# Patient Record
Sex: Male | Born: 1945 | Race: White | Hispanic: No | Marital: Married | Smoking: Former smoker
Health system: Southern US, Community
[De-identification: ages and names within clinical notes are randomized; demographics above are authoritative.]

## PROBLEM LIST (undated history)

## (undated) DIAGNOSIS — H409 Unspecified glaucoma: Secondary | ICD-10-CM

## (undated) DIAGNOSIS — C4491 Basal cell carcinoma of skin, unspecified: Secondary | ICD-10-CM

## (undated) DIAGNOSIS — I509 Heart failure, unspecified: Secondary | ICD-10-CM

## (undated) DIAGNOSIS — K621 Rectal polyp: Secondary | ICD-10-CM

## (undated) DIAGNOSIS — D125 Benign neoplasm of sigmoid colon: Secondary | ICD-10-CM

## (undated) DIAGNOSIS — D122 Benign neoplasm of ascending colon: Secondary | ICD-10-CM

## (undated) DIAGNOSIS — Z972 Presence of dental prosthetic device (complete) (partial): Secondary | ICD-10-CM

## (undated) DIAGNOSIS — I2510 Atherosclerotic heart disease of native coronary artery without angina pectoris: Secondary | ICD-10-CM

## (undated) DIAGNOSIS — I700 Atherosclerosis of aorta: Secondary | ICD-10-CM

## (undated) DIAGNOSIS — I719 Aortic aneurysm of unspecified site, without rupture: Secondary | ICD-10-CM

## (undated) DIAGNOSIS — K5792 Diverticulitis of intestine, part unspecified, without perforation or abscess without bleeding: Secondary | ICD-10-CM

## (undated) DIAGNOSIS — E7800 Pure hypercholesterolemia, unspecified: Secondary | ICD-10-CM

## (undated) DIAGNOSIS — K635 Polyp of colon: Secondary | ICD-10-CM

## (undated) DIAGNOSIS — I714 Abdominal aortic aneurysm, without rupture: Secondary | ICD-10-CM

## (undated) DIAGNOSIS — I82409 Acute embolism and thrombosis of unspecified deep veins of unspecified lower extremity: Secondary | ICD-10-CM

## (undated) DIAGNOSIS — D124 Benign neoplasm of descending colon: Secondary | ICD-10-CM

## (undated) HISTORY — DX: Polyp of colon: K63.5

## (undated) HISTORY — DX: Unspecified glaucoma: H40.9

## (undated) HISTORY — DX: Abdominal aortic aneurysm, without rupture: I71.4

## (undated) HISTORY — DX: Basal cell carcinoma of skin, unspecified: C44.91

## (undated) HISTORY — DX: Benign neoplasm of descending colon: D12.4

## (undated) HISTORY — PX: COLONOSCOPY: SHX174

## (undated) HISTORY — DX: Heart failure, unspecified: I50.9

## (undated) HISTORY — DX: Rectal polyp: K62.1

## (undated) HISTORY — PX: OTHER SURGICAL HISTORY: SHX169

## (undated) HISTORY — DX: Atherosclerosis of aorta: I70.0

## (undated) HISTORY — PX: CATARACT EXTRACTION: SUR2

## (undated) HISTORY — DX: Benign neoplasm of ascending colon: D12.2

## (undated) HISTORY — DX: Acute embolism and thrombosis of unspecified deep veins of unspecified lower extremity: I82.409

## (undated) HISTORY — DX: Benign neoplasm of sigmoid colon: D12.5

## (undated) HISTORY — DX: Aortic aneurysm of unspecified site, without rupture: I71.9

---

## 2005-02-04 HISTORY — PX: MOHS SURGERY: SHX181

## 2012-07-19 ENCOUNTER — Ambulatory Visit: Payer: Self-pay | Admitting: Gastroenterology

## 2013-07-29 DIAGNOSIS — Z87898 Personal history of other specified conditions: Secondary | ICD-10-CM | POA: Insufficient documentation

## 2013-07-29 HISTORY — DX: Personal history of other specified conditions: Z87.898

## 2013-08-02 ENCOUNTER — Ambulatory Visit: Payer: Self-pay | Admitting: Internal Medicine

## 2013-08-02 LAB — LIPID PANEL
CHOLESTEROL: 131 mg/dL (ref 0–200)
HDL: 37 mg/dL (ref 35–70)
LDL CALC: 68 mg/dL
Triglycerides: 131 mg/dL (ref 40–160)

## 2013-08-02 LAB — CBC AND DIFFERENTIAL: HEMOGLOBIN: 17.8 g/dL — AB (ref 13.5–17.5)

## 2013-08-02 LAB — BASIC METABOLIC PANEL
BUN: 12 mg/dL (ref 4–21)
CREATININE: 1 mg/dL (ref <=1.3)

## 2013-08-02 LAB — PSA: PSA: 2

## 2013-08-02 LAB — TSH: TSH: 2.2 u[IU]/mL (ref <=5.90)

## 2013-08-10 ENCOUNTER — Observation Stay: Payer: Self-pay | Admitting: Surgery

## 2013-08-10 ENCOUNTER — Ambulatory Visit: Payer: Self-pay | Admitting: Internal Medicine

## 2013-08-10 LAB — CBC WITH DIFFERENTIAL/PLATELET
BASOS ABS: 0.1 10*3/uL (ref 0.0–0.1)
BASOS PCT: 0.7 %
EOS PCT: 1.5 %
Eosinophil #: 0.2 10*3/uL (ref 0.0–0.7)
HCT: 48.9 % (ref 40.0–52.0)
HGB: 15.8 g/dL (ref 13.0–18.0)
LYMPHS PCT: 21.1 %
Lymphocyte #: 3 10*3/uL (ref 1.0–3.6)
MCH: 30.8 pg (ref 26.0–34.0)
MCHC: 32.3 g/dL (ref 32.0–36.0)
MCV: 95 fL (ref 80–100)
MONO ABS: 1 x10 3/mm (ref 0.2–1.0)
MONOS PCT: 7.2 %
NEUTROS ABS: 9.9 10*3/uL — AB (ref 1.4–6.5)
Neutrophil %: 69.5 %
Platelet: 397 10*3/uL (ref 150–440)
RBC: 5.13 10*6/uL (ref 4.40–5.90)
RDW: 13.3 % (ref 11.5–14.5)
WBC: 14.2 10*3/uL — ABNORMAL HIGH (ref 3.8–10.6)

## 2013-08-10 LAB — BASIC METABOLIC PANEL
Anion Gap: 8 (ref 7–16)
BUN: 8 mg/dL (ref 7–18)
CHLORIDE: 105 mmol/L (ref 98–107)
CO2: 26 mmol/L (ref 21–32)
CREATININE: 0.81 mg/dL (ref 0.60–1.30)
Calcium, Total: 9.4 mg/dL (ref 8.5–10.1)
EGFR (African American): >60
EGFR (Non-African Amer.): >60
GLUCOSE: 85 mg/dL (ref 65–99)
Osmolality: 275 (ref 275–301)
Potassium: 3.1 mmol/L — ABNORMAL LOW (ref 3.5–5.1)
Sodium: 139 mmol/L (ref 136–145)

## 2013-08-11 LAB — COMPREHENSIVE METABOLIC PANEL
ANION GAP: 6 — AB (ref 7–16)
Albumin: 2.3 g/dL — ABNORMAL LOW (ref 3.4–5.0)
Alkaline Phosphatase: 57 U/L
BUN: 8 mg/dL (ref 7–18)
Bilirubin,Total: 0.2 mg/dL (ref 0.2–1.0)
CHLORIDE: 107 mmol/L (ref 98–107)
CO2: 27 mmol/L (ref 21–32)
Calcium, Total: 8.6 mg/dL (ref 8.5–10.1)
Creatinine: 0.83 mg/dL (ref 0.60–1.30)
EGFR (African American): >60
Glucose: 102 mg/dL — ABNORMAL HIGH (ref 65–99)
OSMOLALITY: 278 (ref 275–301)
Potassium: 3.3 mmol/L — ABNORMAL LOW (ref 3.5–5.1)
SGOT(AST): 10 U/L — ABNORMAL LOW (ref 15–37)
SGPT (ALT): 14 U/L (ref 12–78)
SODIUM: 140 mmol/L (ref 136–145)
Total Protein: 6.4 g/dL (ref 6.4–8.2)

## 2013-08-11 LAB — CBC WITH DIFFERENTIAL/PLATELET
Basophil #: 0 10*3/uL (ref 0.0–0.1)
Basophil %: 0.5 %
EOS ABS: 0.2 10*3/uL (ref 0.0–0.7)
Eosinophil %: 2.3 %
HCT: 46.2 % (ref 40.0–52.0)
HGB: 15.5 g/dL (ref 13.0–18.0)
Lymphocyte #: 2.3 10*3/uL (ref 1.0–3.6)
Lymphocyte %: 25.2 %
MCH: 32.2 pg (ref 26.0–34.0)
MCHC: 33.7 g/dL (ref 32.0–36.0)
MCV: 96 fL (ref 80–100)
MONOS PCT: 8.6 %
Monocyte #: 0.8 x10 3/mm (ref 0.2–1.0)
NEUTROS PCT: 63.4 %
Neutrophil #: 5.7 10*3/uL (ref 1.4–6.5)
Platelet: 368 10*3/uL (ref 150–440)
RBC: 4.84 10*6/uL (ref 4.40–5.90)
RDW: 13.3 % (ref 11.5–14.5)
WBC: 9.1 10*3/uL (ref 3.8–10.6)

## 2013-08-11 LAB — LIPASE, BLOOD: Lipase: 72 U/L — ABNORMAL LOW (ref 73–393)

## 2013-08-11 LAB — AMYLASE: Amylase: 26 U/L (ref 25–115)

## 2013-08-11 LAB — PROTIME-INR
INR: 1.1
Prothrombin Time: 14.1 secs (ref 11.5–14.7)

## 2013-08-11 LAB — MAGNESIUM: Magnesium: 2.1 mg/dL

## 2013-08-11 LAB — APTT: ACTIVATED PTT: 33.1 s (ref 23.6–35.9)

## 2013-08-15 LAB — CULTURE, BLOOD (SINGLE)

## 2013-09-08 DIAGNOSIS — Z85820 Personal history of malignant melanoma of skin: Secondary | ICD-10-CM | POA: Insufficient documentation

## 2013-09-21 ENCOUNTER — Ambulatory Visit: Payer: Self-pay | Admitting: Surgery

## 2014-02-17 DIAGNOSIS — C4491 Basal cell carcinoma of skin, unspecified: Secondary | ICD-10-CM

## 2014-02-17 HISTORY — DX: Basal cell carcinoma of skin, unspecified: C44.91

## 2014-06-10 NOTE — H&P (Signed)
History of Present Illness 35 yowm w/ 5 weeks bilateral lower abdominal pain (since he last returned from [place]), worsening, over the last week and particularly in the last few days. His pain is now lower midline only. He has had 1 Miralax / Gatorade bowel prep, but o/w has increased his fiber and water intake. He has 2 - 3 liquid BMs per day, which have continued. No fever. No blood in his BMs. Over 2 days ago he had some dry heaves; those have resolved.  13 months ago he had 2 benign tubular adenomas removed from the sigmoid during a colonoscopy by Dr Allen Norris, who saw no other abnormalities.   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, married, smokes 1 PPD, drinks rarely (socially)   + Tobacco Current (within 1 year)   Place of Living Home   Review of Systems:  Fever/Chills No   Cough No   Sputum No   Abdominal Pain Yes   Diarrhea Yes   Constipation No   Nausea/Vomiting Yes   SOB/DOE No   Chest Pain No   Dysuria No   Tolerating PT Yes   Tolerating Diet Yes   Medications/Allergies Reviewed Medications/Allergies reviewed   Physical Exam:  GEN well developed, well nourished, no acute distress   HEENT pink conjunctivae, PERRL, hearing intact to voice, moist oral mucosa, Oropharynx clear, good dentition   NECK supple  No masses  trachea midline   RESP normal resp effort  clear BS  no use of accessory muscles   CARD regular rate  no murmur  no thrills  no JVD  no Rub   ABD denies tenderness  soft  completely nontender   GU no superpubic tenderness   LYMPH negative neck   EXTR negative cyanosis/clubbing,  negative edema   SKIN No rashes, No ulcers, skin turgor good   NEURO cranial nerves intact, negative tremor, follows commands, motor/sensory function intact   PSYCH alert, A+O to time, place, person   Lab Results: Routine Hem:  24-Jun-15 13:37   WBC (CBC)  14.2  RBC (CBC) 5.13  Hemoglobin (CBC) 15.8  Hematocrit (CBC) 48.9  Platelet Count (CBC) 397  MCV 95  MCH 30.8  MCHC 32.3  RDW 13.3  Neutrophil % 69.5  Lymphocyte % 21.1  Monocyte % 7.2  Eosinophil % 1.5  Basophil % 0.7  Neutrophil #  9.9  Lymphocyte # 3.0  Monocyte # 1.0  Eosinophil # 0.2  Basophil # 0.1 (Result(s) reported on 10 Aug 2013 at 03:06PM.)   Radiology Results: XRay:    16-Jun-15 12:19, Kidney Ureter Bladder ([place])  Kidney Ureter Bladder ([place])  REASON FOR EXAM:    abdominal bloating  COMMENTS:       PROCEDURE: MDR - MDR KIDNEY URETER BLADDER  - Aug 02 2013 12:19PM     CLINICAL DATA:  Lower BILATERAL abdominal pain for 3 weeks with  bloating, history irritable bowel syndrome    EXAM:  ABDOMEN - 1 VIEW    COMPARISON:  None    FINDINGS:  Air-filled nondistended loops of small bowel in the mid abdomen.  Gas and stool in RIGHT colon and rectum.    No  bowel dilatation, evidence of obstruction or wall thickening.    Bones appear demineralized.    No urinary tract calcification.    Small calcifications project over the expected position of the  pancreas consistent with chronic calcific pancreatitis.     IMPRESSION:  Normal bowel gas pattern.    Chronic calcific pancreatitis.  ElectronicallySigned    By: Lavonia Dana M.D.    On: 08/02/2013 13:56         Verified By: Burnetta Sabin, M.D.,  LabUnknown:  PACS Image    24-Jun-15 10:05, CT Abdomen and Pelvis With Contrast  PACS Image  CT:  CT Abdomen and Pelvis With Contrast  REASON FOR EXAM:    Abd pain  bloating  COMMENTS:       PROCEDURE: MCT - MCT ABDOMEN / PELVIS W  - Aug 10 2013 10:05AM     CLINICAL DATA:  Constipation and  lower abdominal pressure worsening  over the last 5 weeks. Bloating and loss of appetite.    EXAM:  CT ABDOMEN AND PELVIS WITH CONTRAST    TECHNIQUE:  Multidetector CT imaging of the abdomen and pelvis was performed  using the standard protocol following bolus administration of  intravenous contrast.  CONTRAST:  125 cc Isovue 370    COMPARISON:  Plain films 08/02/2013.    FINDINGS:  Lower Chest: Centrilobular emphysema. Normal heart size without  pericardial or pleural effusion. Extensive ulcerative plaque within  the lower thoracic aorta. Possible subtle penetrating ulcer at the 2  o'clock position on image 10/series 2.    Abdomen/Pelvis: Mild hepatic steatosis. More focal steatosis  adjacent to falciform ligament. 6 mm nonspecific posterior splenic  lesion.    Normal stomach. Pancreatic parenchymal calcifications. Normal  gallbladder, biliary tract, adrenal glands, kidneys.    Multiple right renal arteries. Infrarenal abdominal aortic  dilatation at maximally 3.3 cm. Extensive aortic atherosclerosis.    Small retroperitoneal nodes. Slightly increased in number. The  largest measures 9 mm, not pathologic by size criteria.    Moderate wall thickening involves the sigmoid colon, including on  image 71/series 2. There is an ill-defined peripherally enhancing  complex fluid collection within the adjacent mesocolon. This  measures 4.9 x 6.6 cm on image 62/series 2. There are scattered  diverticula throughout the sigmoid. Moderate inflammation surrounds  the area of sigmoid wall thickening. No extraluminal air identified.  No evidence of colonic obstruction. Normal terminal ileum and  appendix. Normal small bowel without abdominal ascites. No evidence  of omental or peritoneal disease.    Bilateral common iliac artery aneurysms. 2.0 cm on the right and 1.7  cm on the left. Tiny fat containing left inguinal hernia.    No pelvic sidewall adenopathy. 1.0 cm node in the sigmoid  mesocolon  on image 59.    Normal urinary bladder. Mild prostatomegaly. No significant free  fluid.    Bones/Musculoskeletal:  No acute osseous abnormality.     IMPRESSION:  1. Sigmoid colonic wall thickening with a pericolonic peripherally  enhancing complex fluid collection, most consistent with developing  abscess. The colonic wall thickening is suspicious for carcinoma  (with perforation), given its focality. Sigmoid diverticulitis could  looksimilar. These results will be called to the ordering clinician  or representative by the Radiologist Assistant, and communication  documented in the PACS or zVision Dashboard.  2. Adenopathy in the sigmoid mesocolon which could be reactive or  related to nodal spread of carcinoma.  3. Thoracoabdominal aortic atherosclerosis with descending thoracic  aortic  ulcerative plaque and possible subtle penetrating ulcer.  Infrarenal abdominal aortic ectasia with bilateral common iliac  artery aneurysms.  4. Mild hepatic steatosis.  5. Findings of chronic calcific pancreatitis.  Electronically Signed    By: Abigail Miyamoto M.D.    On: 08/10/2013 11:50         Verified By: Areta Haber, M.D.,    Assessment/Admission Diagnosis Contained perforated mid-distal sigmoid colon, with 5 x 7 cm developing abscess, and thickened wall. Most likely diverticular in etiology (and not CA) since he has had a recent colonoscopy. Too remote from colonoscopy for a technical complication. Also has descending aortic plaque, a small AAA (69mm) and small bilateral iliac A aneurysms, and some calcifications of his pancreas. I doubt this is embolic in nature and I doubt it is related to his [place] trip, but those are possible.   Plan Admit, IVF, IV ABx, clear liq diet. May repeat CT and consider percutaneous drainage, once (and if) abscess cavity matures, so that he can avoid (temporary) diversion.   Electronic Signatures: Consuela Mimes (MD)  (Signed 24-Jun-15  15:22)  Authored: CHIEF COMPLAINT and HISTORY, PAST MEDICAL/SURGIAL HISTORY, ALLERGIES, HOME MEDICATIONS, FAMILY AND SOCIAL HISTORY, REVIEW OF SYSTEMS, PHYSICAL EXAM, LABS, Radiology, ASSESSMENT AND PLAN   Last Updated: 24-Jun-15 15:22 by Consuela Mimes (MD)

## 2014-08-18 ENCOUNTER — Other Ambulatory Visit: Payer: Self-pay | Admitting: Internal Medicine

## 2014-08-29 ENCOUNTER — Telehealth: Payer: Self-pay | Admitting: Gastroenterology

## 2014-08-29 ENCOUNTER — Encounter: Payer: Self-pay | Admitting: Internal Medicine

## 2014-08-29 ENCOUNTER — Other Ambulatory Visit: Payer: Self-pay

## 2014-08-29 DIAGNOSIS — R351 Nocturia: Secondary | ICD-10-CM | POA: Insufficient documentation

## 2014-08-29 DIAGNOSIS — E782 Mixed hyperlipidemia: Secondary | ICD-10-CM | POA: Insufficient documentation

## 2014-08-29 DIAGNOSIS — M722 Plantar fascial fibromatosis: Secondary | ICD-10-CM | POA: Insufficient documentation

## 2014-08-29 DIAGNOSIS — C44311 Basal cell carcinoma of skin of nose: Secondary | ICD-10-CM | POA: Insufficient documentation

## 2014-08-29 DIAGNOSIS — E785 Hyperlipidemia, unspecified: Secondary | ICD-10-CM

## 2014-08-29 DIAGNOSIS — K5720 Diverticulitis of large intestine with perforation and abscess without bleeding: Secondary | ICD-10-CM

## 2014-08-29 NOTE — Telephone Encounter (Signed)
Pt scheduled for colon at [facility] 9-9 instructions given to pt

## 2014-09-01 ENCOUNTER — Encounter: Payer: Self-pay | Admitting: Internal Medicine

## 2014-09-01 ENCOUNTER — Ambulatory Visit (INDEPENDENT_AMBULATORY_CARE_PROVIDER_SITE_OTHER): Payer: Medicare Other | Admitting: Internal Medicine

## 2014-09-01 VITALS — BP 128/80 | HR 76 | Ht 72.0 in | Wt 252.6 lb

## 2014-09-01 DIAGNOSIS — Z720 Tobacco use: Secondary | ICD-10-CM

## 2014-09-01 DIAGNOSIS — F17200 Nicotine dependence, unspecified, uncomplicated: Secondary | ICD-10-CM | POA: Insufficient documentation

## 2014-09-01 MED ORDER — VARENICLINE TARTRATE 0.5 MG X 11 & 1 MG X 42 PO MISC: ORAL | Status: DC

## 2014-09-01 MED ORDER — VARENICLINE TARTRATE 1 MG PO TABS: 1.0000 mg | ORAL_TABLET | Freq: Two times a day (BID) | ORAL | Status: DC

## 2014-09-01 NOTE — Progress Notes (Signed)
Date:  09/01/2014   Name:  Mark Hooper   DOB:  19-Feb-1945   MRN:  [id number]   Chief Complaint: Nicotine Dependence Patient is very interested in smoking cessation.  He tried Chantix briefly in the past.  He denies cough, wheezing, hemoptysis or significant shortness of breath.  He has only quit smoking once in his life for 2 years - he did it without any aids.  Review of Systems:  Review of Systems  Constitutional: Negative for fever, chills and unexpected weight change.  HENT: Negative for trouble swallowing and voice change.   Respiratory: Positive for shortness of breath. Negative for cough, chest tightness and wheezing.   Cardiovascular: Negative for chest pain, palpitations and leg swelling.  Genitourinary: Positive for frequency. Negative for dysuria and hematuria.  Neurological: Negative for dizziness and light-headedness.  Psychiatric/Behavioral: Negative for dysphoric mood.    Patient Active Problem List   Diagnosis Date Noted  . Basal cell carcinoma of nose 08/29/2014  . HLD (hyperlipidemia) 08/29/2014  . Excessive urination at night 08/29/2014  . Plantar fasciitis 08/29/2014  . Colonic diverticular abscess 07/29/2013    Prior to Admission medications   Medication Sig Start Date End Date Taking? Authorizing Provider  simvastatin (ZOCOR) 20 MG tablet TAKE 1 TABLET BY MOUTH EVERY DAY 08/18/14  Yes Glean Hess, MD    No Known Allergies  Past Surgical History  Procedure Laterality Date  . Cataract extraction    . Basal cell cancer excision      History  Substance Use Topics  . Smoking status: Current Every Day Smoker  . Smokeless tobacco: Not on file  . Alcohol Use: 2.4 oz/week    4 Standard drinks or equivalent per week     Medication list has been reviewed and updated.  Physical Examination:  Physical Exam  Constitutional: He is oriented to person, place, and time. He appears well-developed and well-nourished. No distress.  HENT:  Head:  Normocephalic and atraumatic.  Eyes: Conjunctivae are normal. Right eye exhibits no discharge. Left eye exhibits no discharge. No scleral icterus.  Pulmonary/Chest: Effort normal. No respiratory distress.  Musculoskeletal: Normal range of motion.  Neurological: He is alert and oriented to person, place, and time.  Skin: Skin is warm and dry. No rash noted.  Psychiatric: He has a normal mood and affect. His behavior is normal. Thought content normal.    BP 128/80 mmHg  Pulse 76  Ht 6' (1.829 m)  Wt 252 lb 9.6 oz (114.579 kg)  BMI 34.25 kg/m2  Assessment and Plan: 1. Tobacco use disorder - varenicline (CHANTIX STARTING MONTH PAK) 0.5 MG X 11 & 1 MG X 42 tablet; Take one 0.5 mg tablet by mouth once daily for 3 days, then increase to one 0.5 mg tablet twice daily for 4 days, then increase to one 1 mg tablet twice daily.  Dispense: 53 tablet; Refill: 0 - varenicline (CHANTIX CONTINUING MONTH PAK) 1 MG tablet; Take 1 tablet (1 mg total) by mouth 2 (two) times daily.  Dispense: 60 tablet; Refill: [place], MD [facility]  09/01/2014

## 2014-09-01 NOTE — Patient Instructions (Signed)
Nicotine Addiction Nicotine can act as both a stimulant (excites/activates) and a sedative (calms/quiets). Immediately after exposure to nicotine, there is a "kick" caused in part by the drug's stimulation of the adrenal glands and resulting discharge of adrenaline (epinephrine). The rush of adrenaline stimulates the body and causes a sudden release of sugar. This means that smokers are always slightly hyperglycemic. Hyperglycemic means that the blood sugar is high, just like in diabetics. Nicotine also decreases the amount of insulin which helps control sugar levels in the body. There is an increase in blood pressure, breathing, and the rate of heart beats.  In addition, nicotine indirectly causes a release of dopamine in the brain that controls pleasure and motivation. A similar reaction is seen with other drugs of abuse, such as cocaine and heroin. This dopamine release is thought to cause the pleasurable sensations when smoking. In some different cases, nicotine can also create a calming effect, depending on sensitivity of the smoker's nervous system and the dose of nicotine taken. WHAT HAPPENS WHEN NICOTINE IS TAKEN FOR LONG PERIODS OF TIME?  Long-term use of nicotine results in addiction. It is difficult to stop.  Repeated use of nicotine creates tolerance. Higher doses of nicotine are needed to get the "kick." When nicotine use is stopped, withdrawal may last a month or more. Withdrawal may begin within a few hours after the last cigarette. Symptoms peak within the first few days and may lessen within a few weeks. For some people, however, symptoms may last for months or longer. Withdrawal symptoms include:   Irritability.  Craving.  Learning and attention deficits.  Sleep disturbances.  Increased appetite. Craving for tobacco may last for 6 months or longer. Many behaviors done while using nicotine can also play a part in the severity of withdrawal symptoms. For some people, the feel,  smell, and sight of a cigarette and the ritual of obtaining, handling, lighting, and smoking the cigarette are closely linked with the pleasure of smoking. When stopped, they also miss the related behaviors which make the withdrawal or craving worse. While nicotine gum and patches may lessen the drug aspects of withdrawal, cravings often persist. WHAT ARE THE MEDICAL CONSEQUENCES OF NICOTINE USE?  Nicotine addiction accounts for one-third of all cancers. The top cancer caused by tobacco is lung cancer. Lung cancer is the number one cancer killer of both men and women.  Smoking is also associated with cancers of the:  Mouth.  Pharynx.  Larynx.  Esophagus.  Stomach.  Pancreas.  Cervix.  Kidney.  Ureter.  Bladder.  Smoking also causes lung diseases such as lasting (chronic) bronchitis and emphysema.  It worsens asthma in adults and children.  Smoking increases the risk of heart disease, including:  Stroke.  Heart attack.  Vascular disease.  Aneurysm.  Passive or secondary smoke can also increase medical risks including:  Asthma in children.  Sudden Infant Death Syndrome (SIDS).  Additionally, dropped cigarettes are the leading cause of residential fire fatalities.  Nicotine poisoning has been reported from accidental ingestion of tobacco products by children and pets. Death usually results in a few minutes from respiratory failure (when a person stops breathing) caused by paralysis. TREATMENT   Medication. Nicotine replacement medicines such as nicotine gum and the patch are used to stop smoking. These medicines gradually lower the dosage of nicotine in the body. These medicines do not contain the carbon monoxide and other toxins found in tobacco smoke.  Hypnotherapy.  Relaxation therapy.  Nicotine Anonymous (a 12-step support   program). Find times and locations in your local yellow pages. Document Released: 10/10/2003 Document Revised: 04/28/2011 Document  Reviewed: 04/01/2013 ExitCare Patient Information 2015 [employer]. This information is not intended to replace advice given to you by your health care provider. Make sure you discuss any questions you have with your health care provider.  

## 2014-10-17 ENCOUNTER — Encounter: Payer: Self-pay | Admitting: *Deleted

## 2014-10-25 NOTE — Discharge Instructions (Signed)

## 2014-10-27 ENCOUNTER — Ambulatory Visit: Payer: Medicare Other | Admitting: Anesthesiology

## 2014-10-27 ENCOUNTER — Encounter: Payer: Self-pay | Admitting: Anesthesiology

## 2014-10-27 ENCOUNTER — Ambulatory Visit
Admission: RE | Admit: 2014-10-27 | Discharge: 2014-10-27 | Disposition: A | Discharge status: Home / Self Care | Payer: Medicare Other | Source: Ambulatory Visit | Attending: Gastroenterology | Admitting: Gastroenterology

## 2014-10-27 ENCOUNTER — Encounter
Admission: RE | Disposition: A | Discharge status: Home / Self Care | Payer: Self-pay | Source: Ambulatory Visit | Attending: Gastroenterology

## 2014-10-27 DIAGNOSIS — F17210 Nicotine dependence, cigarettes, uncomplicated: Secondary | ICD-10-CM | POA: Diagnosis not present

## 2014-10-27 DIAGNOSIS — K5730 Diverticulosis of large intestine without perforation or abscess without bleeding: Secondary | ICD-10-CM | POA: Insufficient documentation

## 2014-10-27 DIAGNOSIS — E780 Pure hypercholesterolemia: Secondary | ICD-10-CM | POA: Insufficient documentation

## 2014-10-27 DIAGNOSIS — Z1211 Encounter for screening for malignant neoplasm of colon: Secondary | ICD-10-CM | POA: Insufficient documentation

## 2014-10-27 DIAGNOSIS — Z860100 Personal history of colon polyps, unspecified: Secondary | ICD-10-CM | POA: Insufficient documentation

## 2014-10-27 DIAGNOSIS — Z85828 Personal history of other malignant neoplasm of skin: Secondary | ICD-10-CM | POA: Insufficient documentation

## 2014-10-27 DIAGNOSIS — K648 Other hemorrhoids: Secondary | ICD-10-CM | POA: Insufficient documentation

## 2014-10-27 DIAGNOSIS — D124 Benign neoplasm of descending colon: Secondary | ICD-10-CM

## 2014-10-27 DIAGNOSIS — Z8719 Personal history of other diseases of the digestive system: Secondary | ICD-10-CM | POA: Insufficient documentation

## 2014-10-27 DIAGNOSIS — D122 Benign neoplasm of ascending colon: Secondary | ICD-10-CM | POA: Diagnosis not present

## 2014-10-27 DIAGNOSIS — Z6833 Body mass index (BMI) 33.0-33.9, adult: Secondary | ICD-10-CM | POA: Insufficient documentation

## 2014-10-27 DIAGNOSIS — D125 Benign neoplasm of sigmoid colon: Secondary | ICD-10-CM

## 2014-10-27 DIAGNOSIS — Z79899 Other long term (current) drug therapy: Secondary | ICD-10-CM | POA: Insufficient documentation

## 2014-10-27 HISTORY — PX: POLYPECTOMY: SHX5525

## 2014-10-27 HISTORY — DX: Presence of dental prosthetic device (complete) (partial): Z97.2

## 2014-10-27 HISTORY — DX: Diverticulitis of intestine, part unspecified, without perforation or abscess without bleeding: K57.92

## 2014-10-27 HISTORY — PX: COLONOSCOPY WITH PROPOFOL: SHX5780

## 2014-10-27 HISTORY — DX: Pure hypercholesterolemia, unspecified: E78.00

## 2014-10-27 SURGERY — COLONOSCOPY WITH PROPOFOL
Anesthesia: Monitor Anesthesia Care | Wound class: Contaminated

## 2014-10-27 MED ORDER — LACTATED RINGERS IV SOLN
INTRAVENOUS | Status: DC
  Administered 2014-10-27: 08:00:00 via INTRAVENOUS

## 2014-10-27 MED ORDER — OXYCODONE HCL 5 MG PO TABS: 5.0000 mg | ORAL_TABLET | Freq: Once | ORAL | Status: DC | PRN

## 2014-10-27 MED ORDER — LIDOCAINE HCL (CARDIAC) 20 MG/ML IV SOLN
INTRAVENOUS | Status: DC | PRN
  Administered 2014-10-27: 50 mg via INTRAVENOUS

## 2014-10-27 MED ORDER — PROPOFOL 10 MG/ML IV BOLUS
INTRAVENOUS | Status: DC | PRN
Start: 2014-10-27 — End: 2014-10-27
  Administered 2014-10-27: 50 mg via INTRAVENOUS
  Administered 2014-10-27 (×2): 20 mg via INTRAVENOUS
  Administered 2014-10-27: 30 mg via INTRAVENOUS
  Administered 2014-10-27 (×3): 20 mg via INTRAVENOUS
  Administered 2014-10-27: 50 mg via INTRAVENOUS
  Administered 2014-10-27: 30 mg via INTRAVENOUS
  Administered 2014-10-27 (×2): 20 mg via INTRAVENOUS
  Administered 2014-10-27: 30 mg via INTRAVENOUS

## 2014-10-27 MED ORDER — STERILE WATER FOR IRRIGATION IR SOLN
Status: DC | PRN
  Administered 2014-10-27: 09:00:00

## 2014-10-27 MED ORDER — OXYCODONE HCL 5 MG/5ML PO SOLN: 5.0000 mg | Freq: Once | ORAL | Status: DC | PRN

## 2014-10-27 SURGICAL SUPPLY — 28 items
CANISTER SUCT 1200ML W/VALVE (MISCELLANEOUS) ×4 IMPLANT
FCP ESCP3.2XJMB 240X2.8X (MISCELLANEOUS)
FORCEPS BIOP RAD 4 LRG CAP 4 (CUTTING FORCEPS) IMPLANT
FORCEPS BIOP RJ4 240 W/NDL (MISCELLANEOUS)
FORCEPS ESCP3.2XJMB 240X2.8X (MISCELLANEOUS) IMPLANT
GOWN CVR UNV OPN BCK APRN NK (MISCELLANEOUS) ×4 IMPLANT
GOWN ISOL THUMB LOOP REG UNIV (MISCELLANEOUS) ×4
HEMOCLIP INSTINCT (CLIP) IMPLANT
INJECTOR VARIJECT VIN23 (MISCELLANEOUS) IMPLANT
KIT CO2 TUBING (TUBING) IMPLANT
KIT DEFENDO VALVE AND CONN (KITS) IMPLANT
KIT ENDO PROCEDURE OLY (KITS) ×4 IMPLANT
LIGATOR MULTIBAND 6SHOOTER MBL (MISCELLANEOUS) IMPLANT
MARKER SPOT ENDO TATTOO 5ML (MISCELLANEOUS) IMPLANT
PAD GROUND ADULT SPLIT (MISCELLANEOUS) IMPLANT
SNARE SHORT THROW 13M SML OVAL (MISCELLANEOUS) ×4 IMPLANT
SNARE SHORT THROW 30M LRG OVAL (MISCELLANEOUS) IMPLANT
SPOT EX ENDOSCOPIC TATTOO (MISCELLANEOUS)
SUCTION POLY TRAP 4CHAMBER (MISCELLANEOUS) IMPLANT
TRAP SUCTION POLY (MISCELLANEOUS) ×4 IMPLANT
TUBING CONN 6MMX3.1M (TUBING)
TUBING SUCTION CONN 0.25 STRL (TUBING) IMPLANT
UNDERPAD 30X60 958B10 (PK) (MISCELLANEOUS) IMPLANT
VALVE BIOPSY ENDO (VALVE) IMPLANT
VARIJECT INJECTOR VIN23 (MISCELLANEOUS)
WATER AUXILLARY (MISCELLANEOUS) IMPLANT
WATER STERILE IRR 250ML POUR (IV SOLUTION) ×4 IMPLANT
WATER STERILE IRR 500ML POUR (IV SOLUTION) IMPLANT

## 2014-10-27 NOTE — H&P (Signed)
  [facility]  [address], [place], [place] [address] Phone: [phone] Fax : [phone]  Primary Care Physician:  Halina Maidens, MD Primary Gastroenterologist:  Dr. Allen Norris  Pre-Procedure History & Physical: HPI:  Mark Hooper is a 69 y.o. male is here for an colonoscopy.   Past Medical History  Diagnosis Date  . Hypercholesteremia   . Diverticulitis   . Wears dentures     partial upper    Past Surgical History  Procedure Laterality Date  . Cataract extraction    . Basal cell cancer excision    . Colonoscopy      Prior to Admission medications   Medication Sig Start Date End Date Taking? Authorizing Provider  ibuprofen (ADVIL,MOTRIN) 200 MG tablet Take 200 mg by mouth as needed.   Yes Historical Provider, MD  simvastatin (ZOCOR) 20 MG tablet TAKE 1 TABLET BY MOUTH EVERY DAY 08/18/14  Yes Glean Hess, MD  varenicline (CHANTIX CONTINUING MONTH PAK) 1 MG tablet Take 1 tablet (1 mg total) by mouth 2 (two) times daily. 09/01/14  Yes Glean Hess, MD  varenicline (CHANTIX STARTING MONTH PAK) 0.5 MG X 11 & 1 MG X 42 tablet Take one 0.5 mg tablet by mouth once daily for 3 days, then increase to one 0.5 mg tablet twice daily for 4 days, then increase to one 1 mg tablet twice daily. Patient not taking: Reported on 10/27/2014 09/01/14   Glean Hess, MD    Allergies as of 08/29/2014  . (No Known Allergies)    Family History  Problem Relation Age of Onset  . Hyperlipidemia Father   . CAD Father     Social History   Social History  . Marital Status: Married    Spouse Name: N/A  . Number of Children: N/A  . Years of Education: N/A   Occupational History  . Not on file.   Social History Main Topics  . Smoking status: Current Every Day Smoker -- 1.00 packs/day for 50 years    Types: Cigarettes  . Smokeless tobacco: Not on file  . Alcohol Use: 3.0 oz/week    4 Standard drinks or equivalent, 1 Shots of liquor per week     Comment: rare    . Drug Use: Not on file  . Sexual Activity: Not on file   Other Topics Concern  . Not on file   Social History Narrative    Review of Systems: See HPI, otherwise negative ROS  Physical Exam: BP 140/95 mmHg  Pulse 96  Temp(Src) 97.9 F (36.6 C) (Temporal)  Resp 16  Ht 6' (1.829 m)  Wt 247 lb (112.038 kg)  BMI 33.49 kg/m2  SpO2 95% General:   Alert,  pleasant and cooperative in NAD Head:  Normocephalic and atraumatic. Neck:  Supple; no masses or thyromegaly. Lungs:  Clear throughout to auscultation.    Heart:  Regular rate and rhythm. Abdomen:  Soft, nontender and nondistended. Normal bowel sounds, without guarding, and without rebound.   Neurologic:  Alert and  oriented x4;  grossly normal neurologically.  Impression/Plan: Mark Hooper is here for an colonoscopy to be performed for histroy of colon polyps  Risks, benefits, limitations, and alternatives regarding  colonoscopy have been reviewed with the patient.  Questions have been answered.  All parties agreeable.   Ollen Bowl, MD  10/27/2014, 8:12 AM

## 2014-10-27 NOTE — Transfer of Care (Signed)
Immediate Anesthesia Transfer of Care Note  Patient: Mark Hooper  Procedure(s) Performed: Procedure(s): COLONOSCOPY WITH PROPOFOL (N/A) POLYPECTOMY  Patient Location: PACU  Anesthesia Type: MAC  Level of Consciousness: awake, alert  and patient cooperative  Airway and Oxygen Therapy: Patient Spontanous Breathing and Patient connected to supplemental oxygen  Post-op Assessment: Post-op Vital signs reviewed, Patient's Cardiovascular Status Stable, Respiratory Function Stable, Patent Airway and No signs of Nausea or vomiting  Post-op Vital Signs: Reviewed and stable  Complications: No apparent anesthesia complications

## 2014-10-27 NOTE — Anesthesia Procedure Notes (Signed)
Procedure Name: MAC Performed by: Kadi Hession Pre-anesthesia Checklist: Patient identified, Emergency Drugs available, Suction available, Timeout performed and Patient being monitored Patient Re-evaluated:Patient Re-evaluated prior to inductionOxygen Delivery Method: Nasal cannula Placement Confirmation: positive ETCO2       

## 2014-10-27 NOTE — Anesthesia Postprocedure Evaluation (Signed)
  Anesthesia Post-op Note  Patient: Mark Hooper  Procedure(s) Performed: Procedure(s): COLONOSCOPY WITH PROPOFOL (N/A) POLYPECTOMY  Anesthesia type:MAC  Patient location: PACU  Post pain: Pain level controlled  Post assessment: Post-op Vital signs reviewed, Patient's Cardiovascular Status Stable, Respiratory Function Stable, Patent Airway and No signs of Nausea or vomiting  Post vital signs: Reviewed and stable  Last Vitals:  Filed Vitals:   10/27/14 0923  BP:   Pulse: 81  Temp:   Resp: 20    Level of consciousness: awake, alert  and patient cooperative  Complications: No apparent anesthesia complications

## 2014-10-27 NOTE — Anesthesia Preprocedure Evaluation (Signed)
Anesthesia Evaluation  Patient identified by MRN, date of birth, ID band  Reviewed: NPO status   History of Anesthesia Complications Negative for: history of anesthetic complications  Airway Mallampati: II  TM Distance: >3 FB Neck ROM: full    Dental  (+) Partial Upper, Missing,    Pulmonary Current Smoker,    Pulmonary exam normal        Cardiovascular Exercise Tolerance: Good Normal cardiovascular exam  Lipids    Neuro/Psych negative neurological ROS  negative psych ROS   GI/Hepatic negative GI ROS, Neg liver ROS,   Endo/Other  Morbid obesity (bmi=33;)  Renal/GU negative Renal ROS  negative genitourinary   Musculoskeletal   Abdominal   Peds  Hematology negative hematology ROS (+)   Anesthesia Other Findings   Reproductive/Obstetrics                             Anesthesia Physical Anesthesia Plan  ASA: II  Anesthesia Plan: MAC   Post-op Pain Management:    Induction:   Airway Management Planned:   Additional Equipment:   Intra-op Plan:   Post-operative Plan:   Informed Consent: I have reviewed the patients History and Physical, chart, labs and discussed the procedure including the risks, benefits and alternatives for the proposed anesthesia with the patient or authorized representative who has indicated his/her understanding and acceptance.     Plan Discussed with: CRNA  Anesthesia Plan Comments:         Anesthesia Quick Evaluation

## 2014-10-27 NOTE — Op Note (Signed)
[facility] Patient Name: Mark Hooper Procedure Date: 10/27/2014 8:46 AM MRN: [id number] Account #: [id number] Date of Birth: September 19, 1945 Admit Type: Outpatient Age: 69 Room: [facility] OR ROOM 01 Gender: Male Note Status: Finalized Procedure:         Colonoscopy Indications:       High risk colon cancer surveillance: Personal history of                     colonic polyps Providers:         Lucilla Lame, MD Referring MD:      Halina Maidens, MD (Referring MD) Medicines:         Propofol per Anesthesia Complications:     No immediate complications. Procedure:         Pre-Anesthesia Assessment:                    - Prior to the procedure, a History and Physical was                     performed, and patient medications and allergies were                     reviewed. The patient's tolerance of previous anesthesia                     was also reviewed. The risks and benefits of the procedure                     and the sedation options and risks were discussed with the                     patient. All questions were answered, and informed consent                     was obtained. Prior Anticoagulants: The patient has taken                     no previous anticoagulant or antiplatelet agents. ASA                     Grade Assessment: II - A patient with mild systemic                     disease. After reviewing the risks and benefits, the                     patient was deemed in satisfactory condition to undergo                     the procedure.                    After obtaining informed consent, the colonoscope was                     passed under direct vision. Throughout the procedure, the                     patient's blood pressure, pulse, and oxygen saturations                     were monitored continuously. The [place]  colonoscope (S#: [id number]) was introduced through the anus                     and advanced to the  the cecum, identified by appendiceal                     orifice and ileocecal valve. The colonoscopy was performed                     without difficulty. The patient tolerated the procedure                     well. The quality of the bowel preparation was excellent. Findings:      The perianal and digital rectal examinations were normal.      A 4 mm polyp was found in the ascending colon. The polyp was sessile.       The polyp was removed with a cold snare. Resection and retrieval were       complete.      A 5 mm polyp was found in the descending colon. The polyp was sessile.       The polyp was removed with a cold snare. Resection and retrieval were       complete.      Two sessile polyps were found in the sigmoid colon. The polyps were 3 to       5 mm in size. These polyps were removed with a cold snare. Resection and       retrieval were complete.      Multiple small-mouthed diverticula were found in the sigmoid colon.      Non-bleeding internal hemorrhoids were found during retroflexion. The       hemorrhoids were Grade I (internal hemorrhoids that do not prolapse). Impression:        - One 4 mm polyp in the ascending colon. Resected and                     retrieved.                    - One 5 mm polyp in the descending colon. Resected and                     retrieved.                    - Two 3 to 5 mm polyps in the sigmoid colon. Resected and                     retrieved.                    - Diverticulosis in the sigmoid colon.                    - Non-bleeding internal hemorrhoids. Recommendation:    - Await pathology results.                    - Repeat colonoscopy in 5 years for surveillance. Procedure Code(s): --- Professional ---                    [phone], Colonoscopy, flexible; with removal of tumor(s),                     polyp(s), or other lesion(s) by snare technique  Diagnosis Code(s): --- Professional ---                    Z86.010, Personal history of colonic  polyps                    D12.2, Benign neoplasm of ascending colon                    D12.4, Benign neoplasm of descending colon                    D12.5, Benign neoplasm of sigmoid colon CPT copyright 2014 [employer]. All rights reserved. The codes documented in this report are preliminary and upon coder review may  be revised to meet current compliance requirements. Lucilla Lame, MD 10/27/2014 9:14:38 AM This report has been signed electronically. Number of Addenda: 0 Note Initiated On: 10/27/2014 8:46 AM Scope Withdrawal Time: 0 hours 11 minutes 15 seconds  Total Procedure Duration: 0 hours 18 minutes 15 seconds       [facility]

## 2014-10-30 ENCOUNTER — Encounter: Payer: Self-pay | Admitting: Gastroenterology

## 2014-11-03 ENCOUNTER — Other Ambulatory Visit: Payer: Self-pay

## 2014-11-22 ENCOUNTER — Encounter: Payer: Self-pay | Admitting: Internal Medicine

## 2014-11-28 ENCOUNTER — Telehealth: Payer: Self-pay

## 2014-11-28 NOTE — Telephone Encounter (Signed)
-----   Message from Lucilla Lame, MD sent at 11/28/2014  7:27 AM EDT ----- Let patient know polyps were adenomatous and a repeat colonoscopy is needed in 5 years. ----- Message -----    From: Glennie [place], CMA    Sent: 11/24/2014  10:31 AM      To: Lucilla Lame, MD  Review path in media

## 2014-11-28 NOTE — Telephone Encounter (Signed)
LVM for pt to return my call.

## 2014-11-30 NOTE — Telephone Encounter (Signed)
Received call from pt's wife stating pt is out of the country and will call about his results when he returns.

## 2015-01-08 ENCOUNTER — Other Ambulatory Visit: Payer: Self-pay | Admitting: Internal Medicine

## 2015-01-08 MED ORDER — SIMVASTATIN 20 MG PO TABS: 20.0000 mg | ORAL_TABLET | Freq: Every day | ORAL | Status: DC

## 2015-02-05 ENCOUNTER — Ambulatory Visit (INDEPENDENT_AMBULATORY_CARE_PROVIDER_SITE_OTHER): Payer: Medicare Other | Admitting: Internal Medicine

## 2015-02-05 ENCOUNTER — Encounter: Payer: Self-pay | Admitting: Internal Medicine

## 2015-02-05 VITALS — BP 128/80 | HR 76 | Ht 72.0 in | Wt 254.0 lb

## 2015-02-05 DIAGNOSIS — F17200 Nicotine dependence, unspecified, uncomplicated: Secondary | ICD-10-CM | POA: Diagnosis not present

## 2015-02-05 DIAGNOSIS — Z125 Encounter for screening for malignant neoplasm of prostate: Secondary | ICD-10-CM | POA: Diagnosis not present

## 2015-02-05 DIAGNOSIS — Z23 Encounter for immunization: Secondary | ICD-10-CM | POA: Diagnosis not present

## 2015-02-05 DIAGNOSIS — R3914 Feeling of incomplete bladder emptying: Secondary | ICD-10-CM | POA: Diagnosis not present

## 2015-02-05 DIAGNOSIS — Z87898 Personal history of other specified conditions: Secondary | ICD-10-CM

## 2015-02-05 DIAGNOSIS — Z0000 Encounter for general adult medical examination without abnormal findings: Secondary | ICD-10-CM | POA: Diagnosis not present

## 2015-02-05 DIAGNOSIS — Z86010 Personal history of colonic polyps: Secondary | ICD-10-CM | POA: Diagnosis not present

## 2015-02-05 DIAGNOSIS — Z1159 Encounter for screening for other viral diseases: Secondary | ICD-10-CM | POA: Diagnosis not present

## 2015-02-05 DIAGNOSIS — N401 Enlarged prostate with lower urinary tract symptoms: Secondary | ICD-10-CM | POA: Diagnosis not present

## 2015-02-05 DIAGNOSIS — E785 Hyperlipidemia, unspecified: Secondary | ICD-10-CM

## 2015-02-05 LAB — POCT URINALYSIS DIPSTICK
BILIRUBIN UA: NEGATIVE
Blood, UA: NEGATIVE
Glucose, UA: NEGATIVE
KETONES UA: NEGATIVE
LEUKOCYTES UA: NEGATIVE
Nitrite, UA: NEGATIVE
PROTEIN UA: NEGATIVE
Spec Grav, UA: 1.025
Urobilinogen, UA: 0.2
pH, UA: 5

## 2015-02-05 MED ORDER — TAMSULOSIN HCL 0.4 MG PO CAPS: 0.4000 mg | ORAL_CAPSULE | Freq: Every day | ORAL | Status: DC

## 2015-02-05 NOTE — Progress Notes (Signed)
Patient: Mark Hooper, Male    DOB: 19-Oct-1945, 69 y.o.   MRN: [id number] Visit Date: 02/05/2015  Today's Provider: Halina Maidens, MD   Chief Complaint  Patient presents with  . Medicare Wellness  . Hyperlipidemia   Subjective:    Annual wellness visit Mark Hooper is a 69 y.o. male who presents today for his Subsequent Annual Wellness Visit. He feels fairly well. He reports exercising playing golf. He reports he is sleeping well.   ----------------------------------------------------------- Hyperlipidemia This is a chronic problem. The current episode started more than 1 year ago. The problem is controlled. Pertinent negatives include no chest pain or shortness of breath. Current antihyperlipidemic treatment includes statins. The current treatment provides significant improvement of lipids. There are no compliance problems.   Urinary Frequency  This is a recurrent problem. The problem occurs intermittently. There has been no fever. Associated symptoms include frequency. Pertinent negatives include no chills, hematuria, urgency or vomiting. Treatments tried: prostate symptoms c/w BPH.   tobacco use - patient tried Chantix but stopped it due to nausea and lack of benefit. He uses nitroglycerin patches while he was in the hospital and thinks that might be of benefit. We discussed other measures to reduce smoking and he will try the nicotine patch.  Review of Systems  Constitutional: Negative for fever, chills, fatigue and unexpected weight change.  HENT: Negative for hearing loss, tinnitus, trouble swallowing and voice change.   Eyes: Negative for visual disturbance.  Respiratory: Negative for cough, chest tightness and shortness of breath.   Cardiovascular: Negative for chest pain, palpitations and leg swelling.  Gastrointestinal: Negative for vomiting, abdominal pain, diarrhea and constipation.  Genitourinary: Positive for frequency. Negative for urgency, hematuria, decreased  urine volume and testicular pain.  Musculoskeletal: Negative for joint swelling, arthralgias and gait problem.  Neurological: Negative for dizziness, light-headedness and headaches.  Hematological: Negative for adenopathy. Does not bruise/bleed easily.  Psychiatric/Behavioral: Negative for sleep disturbance and dysphoric mood. The patient is not nervous/anxious.     Social History   Social History  . Marital Status: Married    Spouse Name: N/A  . Number of Children: N/A  . Years of Education: N/A   Occupational History  . Not on file.   Social History Main Topics  . Smoking status: Current Every Day Smoker -- 1.00 packs/day for 50 years    Types: Cigarettes  . Smokeless tobacco: Not on file  . Alcohol Use: 3.0 oz/week    4 Standard drinks or equivalent, 1 Shots of liquor per week     Comment: rare  . Drug Use: Not on file  . Sexual Activity: Not on file   Other Topics Concern  . Not on file   Social History Narrative    Patient Active Problem List   Diagnosis Date Noted  . 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 Hx of colonic polyps   . Benign neoplasm of ascending colon   . Benign neoplasm of descending colon   . Benign neoplasm of sigmoid colon   . Tobacco use disorder 09/01/2014  . Basal cell carcinoma of nose 08/29/2014  . HLD (hyperlipidemia) 08/29/2014  . Plantar fasciitis 08/29/2014  . Colonic diverticular abscess 07/29/2013    Past Surgical History  Procedure Laterality Date  . Cataract extraction    . Basal cell cancer excision    . Colonoscopy    . Colonoscopy with propofol N/A 10/27/2014    Procedure: COLONOSCOPY WITH PROPOFOL;  Surgeon:  Lucilla Lame, MD;  Location: [place];  Service: Endoscopy;  Laterality: N/A;  . Polypectomy  10/27/2014    Procedure: POLYPECTOMY;  Surgeon: Lucilla Lame, MD;  Location: [place];  Service: Endoscopy;;    His family history includes CAD in his father;  Hyperlipidemia in his father.    Previous Medications   IBUPROFEN (ADVIL,MOTRIN) 200 MG TABLET    Take 200 mg by mouth as needed.   SIMVASTATIN (ZOCOR) 20 MG TABLET    Take 1 tablet (20 mg total) by mouth daily.    Patient Care Team: Glean Hess, MD as PCP - General (Family Medicine)     Objective:   Vitals: BP 128/80 mmHg  Pulse 76  Ht 6' (1.829 m)  Wt 254 lb (115.214 kg)  BMI 34.44 kg/m2  Physical Exam  Constitutional: He is oriented to person, place, and time. He appears well-developed and well-nourished.  HENT:  Head: Normocephalic.  Right Ear: Tympanic membrane, external ear and ear canal normal.  Left Ear: Tympanic membrane, external ear and ear canal normal.  Nose: Nose normal.  Mouth/Throat: Uvula is midline and oropharynx is clear and moist.  Eyes: Conjunctivae and EOM are normal. Pupils are equal, round, and reactive to light.  Neck: Normal range of motion. Neck supple. Carotid bruit is not present. No thyromegaly present.  Cardiovascular: Normal rate, regular rhythm, normal heart sounds and intact distal pulses.   Pulmonary/Chest: Effort normal and breath sounds normal. He has no wheezes. Right breast exhibits no mass. Left breast exhibits no mass.  Abdominal: Soft. Normal appearance and bowel sounds are normal. There is no hepatosplenomegaly. There is tenderness in the left lower quadrant. There is no rigidity and no guarding.  Musculoskeletal: Normal range of motion.  Lymphadenopathy:    He has no cervical adenopathy.  Neurological: He is alert and oriented to person, place, and time. He has normal reflexes.  Skin: Skin is warm, dry and intact.  Psychiatric: He has a normal mood and affect. His speech is normal and behavior is normal. Judgment and thought content normal.  Nursing note and vitals reviewed.   Activities of Daily Living In your present state of health, do you have any difficulty performing the following activities: 09/01/2014  Hearing? N    Vision? N  Difficulty concentrating or making decisions? N  Walking or climbing stairs? N  Dressing or bathing? N  Doing errands, shopping? N    Fall Risk Assessment Fall Risk  09/01/2014  Falls in the past year? No      Depression Screen PHQ 2/9 Scores 09/01/2014  PHQ - 2 Score 0    Cognitive Testing - 6-CIT   Correct? Score   What year is it? yes 0 Yes = 0    No = 4  What month is it? yes 0 Yes = 0    No = 3  Remember:     [address]ia [address], [place], [place]     What time is it? yes 0 Yes = 0    No = 3  Count backwards from 20 to 1 yes 0 Correct = 0    1 error = 2   More than 1 error = 4  Say the months of the year in reverse. yes 0 Correct = 0    1 error = 2   More than 1 error = 4  What address did I ask you to remember? yes 0 Correct = 0  1 error =  2    2 error = 4    3 error = 6    4 error = 8    All wrong = 10       TOTAL SCORE  0/28   Interpretation:  Normal  Normal (0-7) Abnormal (8-28)     Lab Results  Component Value Date   CHOL 131 08/02/2013   HDL 37 08/02/2013   LDLCALC 68 08/02/2013   TRIG 131 08/02/2013   Lab Results  Component Value Date   PSA 2.0 08/02/2013     Medicare Annual Wellness Visit Summary:  Reviewed patient's Family Medical History Reviewed and updated list of patient's medical providers Assessment of cognitive impairment was done Assessed patient's functional ability Established a written schedule for health screening [place] Completed and Reviewed  Exercise Activities and Dietary recommendations Goals    . Quit smoking / using tobacco     Did not do well with Chantix due to nausea; will try Nicotine patches       Immunization History  Administered Date(s) Administered  . Influenza-Unspecified 12/19/2014  . Pneumococcal Polysaccharide-23 01/05/2012    Health Maintenance  Topic Date Due  . Hepatitis C Screening  06-22-1945  . TETANUS/TDAP  08/03/1965  . ZOSTAVAX  08/04/2006  . INFLUENZA  VACCINE  09/18/2015  . COLONOSCOPY  10/27/2019  . PNA vac Low Risk Adult  Addressed     Discussed health benefits of physical activity, and encouraged him to engage in regular exercise appropriate for his age and condition.    ------------------------------------------------------------------------------------------------------------   Assessment & Plan:   1. Medicare annual wellness visit, subsequent Medicare wellness measures satisfied Shingles vaccine declined Dietary changes to help with weight loss discussed - POCT urinalysis dipstick  2. HLD (hyperlipidemia) On appropriate statin therapy - Comprehensive metabolic panel - Lipid panel  3. Tobacco use disorder Options discussed; patient will try nicotine patches  4. Hx of colonic polyps Colonoscopy due in 5 years - CBC with Differential/Platelet  5. Prostate cancer screening We will obtain PSA DRE declined by patient  6. Benign prostatic hypertrophy (BPH) with incomplete bladder emptying Begin Flomax - PSA - tamsulosin (FLOMAX) 0.4 MG CAPS capsule; Take 1 capsule (0.4 mg total) by mouth daily.  Dispense: 90 capsule; Refill: 3  7. Need for pneumococcal vaccination - Pneumococcal conjugate vaccine 13-valent IM  8. Need for hepatitis C screening test - Hepatitis C antibody   Halina Maidens, MD [facility]  02/05/2015

## 2015-02-05 NOTE — Patient Instructions (Addendum)
Health Maintenance  Topic Date Due  . Hepatitis C Screening  1946/02/07  . TETANUS/TDAP  08/03/1965  . ZOSTAVAX  08/04/2006  . PNA vac Low Risk Adult (2 of 2 - PCV13) 01/04/2013  . INFLUENZA VACCINE  09/18/2015  . COLONOSCOPY  10/27/2019    Pneumococcal Conjugate Vaccine (PCV13)  1. Why get vaccinated? Vaccination can protect both children and adults from pneumococcal disease. Pneumococcal disease is caused by bacteria that can spread from person to person through close contact. It can cause ear infections, and it can also lead to more serious infections of the:  Lungs (pneumonia),  Blood (bacteremia), and  Covering of the brain and spinal cord (meningitis). Pneumococcal pneumonia is most common among adults. Pneumococcal meningitis can cause deafness and brain damage, and it kills about 1 child in 10 who get it. Anyone can get pneumococcal disease, but children under 22 years of age and adults 57 years and older, people with certain medical conditions, and cigarette smokers are at the highest risk. Before there was a vaccine, the [place] saw:  more than 700 cases of meningitis,  about 13,000 blood infections,  about 5 million ear infections, and  about 200 deaths in children under 5 each year from pneumococcal disease. Since vaccine became available, severe pneumococcal disease in these children has fallen by 88%. About 18,000 older adults die of pneumococcal disease each year in the [place]. Treatment of pneumococcal infections with penicillin and other drugs is not as effective as it used to be, because some strains of the disease have become resistant to these drugs. This makes prevention of the disease, through vaccination, even more important. 2. PCV13 vaccine Pneumococcal conjugate vaccine (called PCV13) protects against 13 types of pneumococcal bacteria. PCV13 is routinely given to children at 2, 4, 6, and 72-57 months of age. It is also recommended for children  and adults 20 to 20 years of age with certain health conditions, and for all adults 24 years of age and older. Your doctor can give you details. 3. Some people should not get this vaccine Anyone who has ever had a life-threatening allergic reaction to a dose of this vaccine, to an earlier pneumococcal vaccine called PCV7, or to any vaccine containing diphtheria toxoid (for example, DTaP), should not get PCV13. Anyone with a severe allergy to any component of PCV13 should not get the vaccine. Tell your doctor if the person being vaccinated has any severe allergies. If the person scheduled for vaccination is not feeling well, your healthcare provider might decide to reschedule the shot on another day. 4. Risks of a vaccine reaction With any medicine, including vaccines, there is a chance of reactions. These are usually mild and go away on their own, but serious reactions are also possible. Problems reported following PCV13 varied by age and dose in the series. The most common problems reported among children were:  About half became drowsy after the shot, had a temporary loss of appetite, or had redness or tenderness where the shot was given.  About 1 out of 3 had swelling where the shot was given.  About 1 out of 3 had a mild fever, and about 1 in 20 had a fever over 102.24F.  Up to about 8 out of 10 became fussy or irritable. Adults have reported pain, redness, and swelling where the shot was given; also mild fever, fatigue, headache, chills, or muscle pain. Young children who get PCV13 along with inactivated flu vaccine at the same time may  be at increased risk for seizures caused by fever. Ask your doctor for more information. Problems that could happen after any vaccine:  People sometimes faint after a medical procedure, including vaccination. Sitting or lying down for about 15 minutes can help prevent fainting, and injuries caused by a fall. Tell your doctor if you feel dizzy, or have vision  changes or ringing in the ears.  Some older children and adults get severe pain in the shoulder and have difficulty moving the arm where a shot was given. This happens very rarely.  Any medication can cause a severe allergic reaction. Such reactions from a vaccine are very rare, estimated at about 1 in a million doses, and would happen within a few minutes to a few hours after the vaccination. As with any medicine, there is a very small chance of a vaccine causing a serious injury or death. The safety of vaccines is always being monitored. For more information, visit: http://www.aguilar.org/ 5. What if there is a serious reaction? What should I look for?  Look for anything that concerns you, such as signs of a severe allergic reaction, very high fever, or unusual behavior. Signs of a severe allergic reaction can include hives, swelling of the face and throat, difficulty breathing, a fast heartbeat, dizziness, and weakness-usually within a few minutes to a few hours after the vaccination. What should I do?  If you think it is a severe allergic reaction or other emergency that can't wait, call 9-1-1 or get the person to the nearest hospital. Otherwise, call your doctor. Reactions should be reported to the Vaccine Adverse Event Reporting System (VAERS). Your doctor should file this report, or you can do it yourself through the VAERS web site at www.vaers.SamedayNews.es, or by calling 316-873-7626. VAERS does not give medical advice. 6. The National Vaccine Injury Compensation Program The [employer] Vaccine Injury Compensation Program (VICP) is a federal program that was created to compensate people who may have been injured by certain vaccines. Persons who believe they may have been injured by a vaccine can learn about the program and about filing a claim by calling 212-828-2008 or visiting the [place] website at GoldCloset.com.ee. There is a time limit to file a claim for compensation. 7.  How can I learn more?  Ask your healthcare provider. He or she can give you the vaccine package insert or suggest other sources of information.  Call your local or state health department.  Contact the Centers for Disease Control and Prevention (CDC):  Call 351-372-4975 (1-800-CDC-INFO) or  Visit CDC's website at http://hunter.com/ Vaccine Information Statement PCV13 Vaccine (12/22/2013)   This information is not intended to replace advice given to you by your health care provider. Make sure you discuss any questions you have with your health care provider.   Document Released: 12/01/2005 Document Revised: 02/24/2014 Document Reviewed: 12/29/2013 Elsevier Interactive Patient Education 2016 [employer]. Smoking Cessation, Tips for Success If you are ready to quit smoking, congratulations! You have chosen to help yourself be healthier. Cigarettes bring nicotine, tar, carbon monoxide, and other irritants into your body. Your lungs, heart, and blood vessels will be able to work better without these poisons. There are many different ways to quit smoking. Nicotine gum, nicotine patches, a nicotine inhaler, or nicotine nasal spray can help with physical craving. Hypnosis, support groups, and medicines help break the habit of smoking. WHAT THINGS CAN I DO TO MAKE QUITTING EASIER?  Here are some tips to help you quit for good:  Pick a  date when you will quit smoking completely. Tell all of your friends and family about your plan to quit on that date.  Do not try to slowly cut down on the number of cigarettes you are smoking. Pick a quit date and quit smoking completely starting on that day.  Throw away all cigarettes.   Clean and remove all ashtrays from your home, work, and car.  On a card, write down your reasons for quitting. Carry the card with you and read it when you get the urge to smoke.  Cleanse your body of nicotine. Drink enough water and fluids to keep your urine clear or  pale yellow. Do this after quitting to flush the nicotine from your body.  Learn to predict your moods. Do not let a bad situation be your excuse to have a cigarette. Some situations in your life might tempt you into wanting a cigarette.  Never have "just one" cigarette. It leads to wanting another and another. Remind yourself of your decision to quit.  Change habits associated with smoking. If you smoked while driving or when feeling stressed, try other activities to replace smoking. Stand up when drinking your coffee. Brush your teeth after eating. Sit in a different chair when you read the paper. Avoid alcohol while trying to quit, and try to drink fewer caffeinated beverages. Alcohol and caffeine may urge you to smoke.  Avoid foods and drinks that can trigger a desire to smoke, such as sugary or spicy foods and alcohol.  Ask people who smoke not to smoke around you.  Have something planned to do right after eating or having a cup of coffee. For example, plan to take a walk or exercise.  Try a relaxation exercise to calm you down and decrease your stress. Remember, you may be tense and nervous for the first 2 weeks after you quit, but this will pass.  Find new activities to keep your hands busy. Play with a pen, coin, or rubber band. Doodle or draw things on paper.  Brush your teeth right after eating. This will help cut down on the craving for the taste of tobacco after meals. You can also try mouthwash.   Use oral substitutes in place of cigarettes. Try using lemon drops, carrots, cinnamon sticks, or chewing gum. Keep them handy so they are available when you have the urge to smoke.  When you have the urge to smoke, try deep breathing.  Designate your home as a nonsmoking area.  If you are a heavy smoker, ask your health care provider about a prescription for nicotine chewing gum. It can ease your withdrawal from nicotine.  Reward yourself. Set aside the cigarette money you save and  buy yourself something nice.  Look for support from others. Join a support group or smoking cessation program. Ask someone at home or at work to help you with your plan to quit smoking.  Always ask yourself, "Do I need this cigarette or is this just a reflex?" Tell yourself, "Today, I choose not to smoke," or "I do not want to smoke." You are reminding yourself of your decision to quit.  Do not replace cigarette smoking with electronic cigarettes (commonly called e-cigarettes). The safety of e-cigarettes is unknown, and some may contain harmful chemicals.  If you relapse, do not give up! Plan ahead and think about what you will do the next time you get the urge to smoke. HOW WILL I FEEL WHEN I QUIT SMOKING? You may have symptoms of  withdrawal because your body is used to nicotine (the addictive substance in cigarettes). You may crave cigarettes, be irritable, feel very hungry, cough often, get headaches, or have difficulty concentrating. The withdrawal symptoms are only temporary. They are strongest when you first quit but will go away within 10-14 days. When withdrawal symptoms occur, stay in control. Think about your reasons for quitting. Remind yourself that these are signs that your body is healing and getting used to being without cigarettes. Remember that withdrawal symptoms are easier to treat than the major diseases that smoking can cause.  Even after the withdrawal is over, expect periodic urges to smoke. However, these cravings are generally short lived and will go away whether you smoke or not. Do not smoke! WHAT RESOURCES ARE AVAILABLE TO HELP ME QUIT SMOKING? Your health care provider can direct you to community resources or hospitals for support, which may include:  Group support.  Education.  Hypnosis.  Therapy.   This information is not intended to replace advice given to you by your health care provider. Make sure you discuss any questions you have with your health care  provider.   Document Released: 11/02/2003 Document Revised: 02/24/2014 Document Reviewed: 07/22/2012 Elsevier Interactive Patient Education [employer].

## 2015-02-06 LAB — COMPREHENSIVE METABOLIC PANEL
ALBUMIN: 4.3 g/dL (ref 3.6–4.8)
ALT: 37 IU/L (ref 0–44)
AST: 22 IU/L (ref 0–40)
Albumin/Globulin Ratio: 1.5 (ref 1.1–2.5)
Alkaline Phosphatase: 64 IU/L (ref 39–117)
BUN / CREAT RATIO: 12 (ref 10–22)
BUN: 12 mg/dL (ref 8–27)
Bilirubin Total: 0.4 mg/dL (ref 0.0–1.2)
CALCIUM: 9.8 mg/dL (ref 8.6–10.2)
CO2: 24 mmol/L (ref 18–29)
CREATININE: 1.04 mg/dL (ref 0.76–1.27)
Chloride: 104 mmol/L (ref 96–106)
GFR calc Af Amer: 85 mL/min/{1.73_m2} (ref >59)
GFR, EST NON AFRICAN AMERICAN: 73 mL/min/{1.73_m2} (ref >59)
GLOBULIN, TOTAL: 2.8 g/dL (ref 1.5–4.5)
Glucose: 85 mg/dL (ref 65–99)
Potassium: 4.7 mmol/L (ref 3.5–5.2)
SODIUM: 144 mmol/L (ref 134–144)
Total Protein: 7.1 g/dL (ref 6.0–8.5)

## 2015-02-06 LAB — CBC WITH DIFFERENTIAL/PLATELET
Basophils Absolute: 0.1 10*3/uL (ref 0.0–0.2)
Basos: 1 %
EOS (ABSOLUTE): 0.1 10*3/uL (ref 0.0–0.4)
EOS: 1 %
HEMATOCRIT: 53.8 % — AB (ref 37.5–51.0)
HEMOGLOBIN: 18.2 g/dL — AB (ref 12.6–17.7)
IMMATURE GRANS (ABS): 0 10*3/uL (ref 0.0–0.1)
IMMATURE GRANULOCYTES: 0 %
Lymphocytes Absolute: 2.5 10*3/uL (ref 0.7–3.1)
Lymphs: 31 %
MCH: 32.7 pg (ref 26.6–33.0)
MCHC: 33.8 g/dL (ref 31.5–35.7)
MCV: 97 fL (ref 79–97)
MONOCYTES: 7 %
Monocytes Absolute: 0.6 10*3/uL (ref 0.1–0.9)
NEUTROS PCT: 60 %
Neutrophils Absolute: 4.8 10*3/uL (ref 1.4–7.0)
Platelets: 256 10*3/uL (ref 150–379)
RBC: 5.57 x10E6/uL (ref 4.14–5.80)
RDW: 14.2 % (ref 12.3–15.4)
WBC: 8 10*3/uL (ref 3.4–10.8)

## 2015-02-06 LAB — HEPATITIS C ANTIBODY: Hep C Virus Ab: <0.1 s/co ratio (ref 0.0–0.9)

## 2015-02-06 LAB — LIPID PANEL
CHOL/HDL RATIO: 3.5 ratio (ref 0.0–5.0)
CHOLESTEROL TOTAL: 158 mg/dL (ref 100–199)
HDL: 45 mg/dL (ref >39)
LDL CALC: 79 mg/dL (ref 0–99)
TRIGLYCERIDES: 171 mg/dL — AB (ref 0–149)
VLDL CHOLESTEROL CAL: 34 mg/dL (ref 5–40)

## 2015-02-06 LAB — PSA: Prostate Specific Ag, Serum: 1.7 ng/mL (ref 0.0–4.0)

## 2015-03-31 ENCOUNTER — Other Ambulatory Visit: Payer: Self-pay | Admitting: Internal Medicine

## 2015-10-12 ENCOUNTER — Other Ambulatory Visit: Payer: Self-pay

## 2015-12-14 ENCOUNTER — Other Ambulatory Visit: Payer: Self-pay | Admitting: Internal Medicine

## 2015-12-14 DIAGNOSIS — N401 Enlarged prostate with lower urinary tract symptoms: Secondary | ICD-10-CM

## 2015-12-14 DIAGNOSIS — R3914 Feeling of incomplete bladder emptying: Principal | ICD-10-CM

## 2016-01-16 ENCOUNTER — Other Ambulatory Visit: Payer: Self-pay | Admitting: Internal Medicine

## 2016-02-06 ENCOUNTER — Encounter: Payer: Self-pay | Admitting: Internal Medicine

## 2016-02-06 ENCOUNTER — Ambulatory Visit (INDEPENDENT_AMBULATORY_CARE_PROVIDER_SITE_OTHER): Payer: Medicare Other | Admitting: Internal Medicine

## 2016-02-06 VITALS — BP 126/82 | HR 87 | Temp 97.5°F | Ht 72.0 in | Wt 247.0 lb

## 2016-02-06 DIAGNOSIS — R3914 Feeling of incomplete bladder emptying: Secondary | ICD-10-CM | POA: Diagnosis not present

## 2016-02-06 DIAGNOSIS — E782 Mixed hyperlipidemia: Secondary | ICD-10-CM | POA: Diagnosis not present

## 2016-02-06 DIAGNOSIS — N401 Enlarged prostate with lower urinary tract symptoms: Secondary | ICD-10-CM

## 2016-02-06 DIAGNOSIS — Z0000 Encounter for general adult medical examination without abnormal findings: Secondary | ICD-10-CM

## 2016-02-06 DIAGNOSIS — F17200 Nicotine dependence, unspecified, uncomplicated: Secondary | ICD-10-CM

## 2016-02-06 DIAGNOSIS — Z0001 Encounter for general adult medical examination with abnormal findings: Secondary | ICD-10-CM

## 2016-02-06 LAB — POCT URINALYSIS DIPSTICK
Bilirubin, UA: NEGATIVE
GLUCOSE UA: NEGATIVE
Ketones, UA: NEGATIVE
Leukocytes, UA: NEGATIVE
NITRITE UA: NEGATIVE
PROTEIN UA: NEGATIVE
RBC UA: NEGATIVE
SPEC GRAV UA: 1.015
UROBILINOGEN UA: 0.2
pH, UA: 5

## 2016-02-06 NOTE — Patient Instructions (Signed)
Health Maintenance  Topic Date Due  . TETANUS/TDAP  08/03/1965  . ZOSTAVAX  02/18/2018 (Originally 08/04/2006)  . COLONOSCOPY  10/27/2019  . INFLUENZA VACCINE  Addressed  . Hepatitis C Screening  Completed  . PNA vac Low Risk Adult  Completed

## 2016-02-06 NOTE — Progress Notes (Signed)
Patient: Mark Hooper, Male    DOB: 01-19-1946, 70 y.o.   MRN: [id number] Visit Date: 02/06/2016  Today's Provider: Halina Maidens, MD   Chief Complaint  Patient presents with  . Medicare Wellness   Subjective:    Annual wellness visit Mark Hooper is a 70 y.o. male who presents today for his Subsequent Annual Wellness Visit. He feels well. He reports exercising golfing 3 days per week. He reports he is sleeping fairly well.   ----------------------------------------------------------- Hyperlipidemia  This is a chronic problem. The current episode started more than 1 year ago. The problem is controlled. Recent lipid tests were reviewed and are normal. Pertinent negatives include no chest pain, myalgias or shortness of breath. Current antihyperlipidemic treatment includes statins. The current treatment provides significant improvement of lipids.  Benign Prostatic Hypertrophy  This is a chronic problem. The current episode started more than 1 year ago. The problem is unchanged. Irritative symptoms include frequency. Irritative symptoms do not include urgency. Pertinent negatives include no chills, dysuria or hematuria. Past treatments include tamsulosin. The treatment provided no relief (restricting fluid intake after dinner helps the most).  Tobacco use - he tried chantix but it only reduced his smoking by about 20%.  He has quit in the past without help.  He is considering trying nicotine patches or gum.  Review of Systems  Constitutional: Negative for appetite change, chills, diaphoresis, fatigue and unexpected weight change.  HENT: Negative for hearing loss, tinnitus, trouble swallowing and voice change.   Eyes: Negative for visual disturbance.  Respiratory: Negative for choking, shortness of breath and wheezing.   Cardiovascular: Negative for chest pain, palpitations and leg swelling.  Gastrointestinal: Negative for abdominal pain, blood in stool, constipation and diarrhea.    Genitourinary: Positive for frequency. Negative for difficulty urinating, dysuria, hematuria and urgency.  Musculoskeletal: Negative for arthralgias, back pain and myalgias.  Skin: Negative for color change and rash.  Neurological: Negative for dizziness, syncope and headaches.  Hematological: Negative for adenopathy.  Psychiatric/Behavioral: Negative for dysphoric mood and sleep disturbance. The patient is not nervous/anxious.     Social History   Social History  . Marital status: Married    Spouse name: N/A  . Number of children: N/A  . Years of education: N/A   Occupational History  . Not on file.   Social History Main Topics  . Smoking status: Current Every Day Smoker    Packs/day: 1.00    Years: 50.00    Types: Cigarettes  . Smokeless tobacco: Current User  . Alcohol use 3.0 oz/week    1 Shots of liquor, 4 Standard drinks or equivalent per week     Comment: rare  . Drug use: Unknown  . Sexual activity: Not on file   Other Topics Concern  . Not on file   Social History Narrative  . No narrative on file    Patient Active Problem List   Diagnosis Date Noted  . 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 Hx of colonic polyps   . Benign neoplasm of ascending colon   . Benign neoplasm of descending colon   . Benign neoplasm of sigmoid colon   . Tobacco use disorder 09/01/2014  . Basal cell carcinoma of nose 08/29/2014  . HLD (hyperlipidemia) 08/29/2014  . Plantar fasciitis 08/29/2014  . Hx of abdominal abscess 07/29/2013    Past Surgical History:  Procedure Laterality Date  . basal cell cancer excision    . CATARACT EXTRACTION    .  COLONOSCOPY    . COLONOSCOPY WITH PROPOFOL N/A 10/27/2014   Procedure: COLONOSCOPY WITH PROPOFOL;  Surgeon: Lucilla Lame, MD;  Location: [facility];  Service: Endoscopy;  Laterality: N/A;  . POLYPECTOMY  10/27/2014   Procedure: POLYPECTOMY;  Surgeon: Lucilla Lame, MD;  Location: [facility];  Service: Endoscopy;;    His family history includes CAD in his father; Hyperlipidemia in his father.     Previous Medications   IBUPROFEN (ADVIL,MOTRIN) 200 MG TABLET    Take 200 mg by mouth as needed.   SIMVASTATIN (ZOCOR) 20 MG TABLET    TAKE 1 TABLET DAILY    Patient Care Team: Glean Hess, MD as PCP - General (Family Medicine)      Objective:   Vitals: BP 126/82   Pulse 87   Temp 97.5 F (36.4 C)   Ht 6' (1.829 m)   Wt 247 lb (112 kg)   SpO2 95%   BMI 33.50 kg/m   Physical Exam  Constitutional: He is oriented to person, place, and time. He appears well-developed and well-nourished.  HENT:  Head: Normocephalic.  Right Ear: Tympanic membrane, external ear and ear canal normal.  Left Ear: Tympanic membrane, external ear and ear canal normal.  Nose: Nose normal.  Mouth/Throat: Uvula is midline and oropharynx is clear and moist.  Eyes: Conjunctivae and EOM are normal. Pupils are equal, round, and reactive to light.  Neck: Normal range of motion. Neck supple. Carotid bruit is not present. No thyromegaly present.  Cardiovascular: Normal rate, regular rhythm, normal heart sounds and intact distal pulses.   Pulmonary/Chest: Effort normal and breath sounds normal. He has no wheezes. Right breast exhibits no mass. Left breast exhibits no mass.  Abdominal: Soft. Normal appearance and bowel sounds are normal. There is no hepatosplenomegaly. There is no tenderness.  Musculoskeletal: Normal range of motion. He exhibits no edema or tenderness.  Lymphadenopathy:    He has no cervical adenopathy.  Neurological: He is alert and oriented to person, place, and time. He has normal reflexes.  Skin: Skin is warm, dry and intact.  Psychiatric: He has a normal mood and affect. His speech is normal and behavior is normal. Judgment and thought content normal. Cognition and memory are normal.  Nursing note and vitals reviewed.   Activities of Daily Living In your present  state of health, do you have any difficulty performing the following activities: 02/06/2016  Hearing? N  Vision? Y  Difficulty concentrating or making decisions? N  Walking or climbing stairs? N  Dressing or bathing? N  Doing errands, shopping? N  Preparing Food and eating ? Y  Using the Toilet? Y  In the past six months, have you accidently leaked urine? N  Do you have problems with loss of bowel control? N  Managing your Medications? Y  Managing your Finances? Y  Housekeeping or managing your Housekeeping? Y  Some recent data might be hidden    Fall Risk Assessment Fall Risk  02/06/2016 10/12/2015 09/01/2014  Falls in the past year? No No No      Depression Screen PHQ 2/9 Scores 02/06/2016 09/01/2014  PHQ - 2 Score 0 0   6CIT Screen 02/06/2016  What Year? 0 points  What month? 0 points  What time? 0 points  Count back from 20 0 points  Months in reverse 0 points  Repeat phrase 0 points  Total Score 0     Medicare Annual Wellness Visit Summary:  Reviewed patient's Family Medical History  Reviewed and updated list of patient's medical providers Assessment of cognitive impairment was done Assessed patient's functional ability Established a written schedule for health screening [place] Completed and Reviewed  Exercise Activities and Dietary recommendations Goals    . Quit smoking / using tobacco          Did not do well with Chantix due to nausea; will try Nicotine patches    . Quit smoking / using tobacco          Tried stop smoking but does not work.       Immunization History  Administered Date(s) Administered  . Influenza-Unspecified 12/19/2014  . Pneumococcal Conjugate-13 02/05/2015  . Pneumococcal Polysaccharide-23 01/05/2012    Health Maintenance  Topic Date Due  . Samul Dada  08/03/1965  . ZOSTAVAX  02/18/2018 (Originally 08/04/2006)  . COLONOSCOPY  10/27/2019  . INFLUENZA VACCINE  Addressed  . Hepatitis C Screening   Completed  . PNA vac Low Risk Adult  Completed    Discussed health benefits of physical activity, and encouraged him to engage in regular exercise appropriate for his age and condition.    ------------------------------------------------------------------------------------------------------------  Assessment & Plan:  1. Medicare annual wellness visit, subsequent Measures satisfied Rx to get Zostavax given - POCT urinalysis dipstick  2. Benign prostatic hyperplasia with incomplete bladder emptying Stable off of medications - PSA  3. Tobacco use disorder Consider patches or gum (chantix not effective) - CBC with Differential/Platelet  4. Mixed hyperlipidemia On statin therapy - Comprehensive metabolic panel - Lipid panel   Halina Maidens, MD [facility]  02/06/2016

## 2016-02-07 LAB — CBC WITH DIFFERENTIAL/PLATELET
BASOS ABS: 0 10*3/uL (ref 0.0–0.2)
Basos: 0 %
EOS (ABSOLUTE): 0.1 10*3/uL (ref 0.0–0.4)
Eos: 1 %
HEMATOCRIT: 55.5 % — AB (ref 37.5–51.0)
HEMOGLOBIN: 18.6 g/dL — AB (ref 13.0–17.7)
Immature Grans (Abs): 0 10*3/uL (ref 0.0–0.1)
Immature Granulocytes: 0 %
LYMPHS ABS: 3 10*3/uL (ref 0.7–3.1)
Lymphs: 33 %
MCH: 32.8 pg (ref 26.6–33.0)
MCHC: 33.5 g/dL (ref 31.5–35.7)
MCV: 98 fL — ABNORMAL HIGH (ref 79–97)
MONOCYTES: 7 %
Monocytes Absolute: 0.6 10*3/uL (ref 0.1–0.9)
NEUTROS ABS: 5.5 10*3/uL (ref 1.4–7.0)
Neutrophils: 59 %
Platelets: 289 10*3/uL (ref 150–379)
RBC: 5.67 x10E6/uL (ref 4.14–5.80)
RDW: 14.2 % (ref 12.3–15.4)
WBC: 9.2 10*3/uL (ref 3.4–10.8)

## 2016-02-07 LAB — COMPREHENSIVE METABOLIC PANEL
ALBUMIN: 4.6 g/dL (ref 3.6–4.8)
ALK PHOS: 63 IU/L (ref 39–117)
ALT: 25 IU/L (ref 0–44)
AST: 18 IU/L (ref 0–40)
Albumin/Globulin Ratio: 1.6 (ref 1.2–2.2)
BILIRUBIN TOTAL: 0.5 mg/dL (ref 0.0–1.2)
BUN / CREAT RATIO: 16 (ref 10–24)
BUN: 17 mg/dL (ref 8–27)
CHLORIDE: 101 mmol/L (ref 96–106)
CO2: 21 mmol/L (ref 18–29)
Calcium: 10 mg/dL (ref 8.6–10.2)
Creatinine, Ser: 1.06 mg/dL (ref 0.76–1.27)
GFR calc Af Amer: 82 mL/min/{1.73_m2} (ref >59)
GFR calc non Af Amer: 71 mL/min/{1.73_m2} (ref >59)
GLOBULIN, TOTAL: 2.9 g/dL (ref 1.5–4.5)
Glucose: 95 mg/dL (ref 65–99)
Potassium: 5.1 mmol/L (ref 3.5–5.2)
SODIUM: 140 mmol/L (ref 134–144)
Total Protein: 7.5 g/dL (ref 6.0–8.5)

## 2016-02-07 LAB — LIPID PANEL
CHOLESTEROL TOTAL: 159 mg/dL (ref 100–199)
Chol/HDL Ratio: 3.4 ratio units (ref 0.0–5.0)
HDL: 47 mg/dL (ref >39)
LDL Calculated: 85 mg/dL (ref 0–99)
Triglycerides: 137 mg/dL (ref 0–149)
VLDL Cholesterol Cal: 27 mg/dL (ref 5–40)

## 2016-02-07 LAB — PSA: Prostate Specific Ag, Serum: 2.2 ng/mL (ref 0.0–4.0)

## 2017-01-12 ENCOUNTER — Other Ambulatory Visit: Payer: Self-pay | Admitting: Internal Medicine

## 2017-01-12 NOTE — Telephone Encounter (Signed)
Patient is in [place], [place] with wife to have him call the office to set up appt.

## 2017-04-10 ENCOUNTER — Other Ambulatory Visit: Payer: Self-pay | Admitting: Internal Medicine

## 2017-08-05 ENCOUNTER — Telehealth: Payer: Self-pay | Admitting: Internal Medicine

## 2017-08-05 NOTE — Telephone Encounter (Signed)
Called to schedule Medicare Annual Wellness Visit with Nurse Health Advisor. If patient returns call, please note: their last AWV was on 12 /20 /17 please schedule AWV with NHA any date  Thank you! For any questions please contact: Jill Alexanders [phone]  Or Skype me at: [email or web address]

## 2017-09-07 ENCOUNTER — Telehealth: Payer: Self-pay | Admitting: Internal Medicine

## 2017-09-07 NOTE — Telephone Encounter (Signed)
Called to schedule Medicare Annual Wellness Visit with Nurse Health Advisor Thank you! For any questions please contact: Jill Alexanders [phone]  Or Skype me at: [email or web address]

## 2017-09-11 ENCOUNTER — Telehealth: Payer: Self-pay | Admitting: Internal Medicine

## 2017-09-11 NOTE — Telephone Encounter (Signed)
Called to schedule Medicare Annual Wellness Visit with Nurse Health Advisor. If patient returns call, please note: their last AWV was on12 /20 /17 please schedule AWV with NHA any date   Thank you! For any questions please contact: Jill Alexanders [phone]  Or Skype me at: [email or web address]

## 2017-09-21 NOTE — Telephone Encounter (Signed)
Lm

## 2017-10-13 ENCOUNTER — Encounter: Payer: Self-pay | Admitting: Internal Medicine

## 2017-10-13 ENCOUNTER — Other Ambulatory Visit: Payer: Self-pay | Admitting: Internal Medicine

## 2017-10-14 ENCOUNTER — Ambulatory Visit (INDEPENDENT_AMBULATORY_CARE_PROVIDER_SITE_OTHER): Payer: Medicare Other | Admitting: Internal Medicine

## 2017-10-14 ENCOUNTER — Encounter: Payer: Self-pay | Admitting: Internal Medicine

## 2017-10-14 VITALS — BP 108/62 | HR 110 | Ht 72.0 in | Wt 239.0 lb

## 2017-10-14 DIAGNOSIS — I77819 Aortic ectasia, unspecified site: Secondary | ICD-10-CM | POA: Insufficient documentation

## 2017-10-14 DIAGNOSIS — J189 Pneumonia, unspecified organism: Secondary | ICD-10-CM

## 2017-10-14 DIAGNOSIS — F17201 Nicotine dependence, unspecified, in remission: Secondary | ICD-10-CM

## 2017-10-14 DIAGNOSIS — R55 Syncope and collapse: Secondary | ICD-10-CM

## 2017-10-14 DIAGNOSIS — J181 Lobar pneumonia, unspecified organism: Secondary | ICD-10-CM

## 2017-10-14 MED ORDER — AZITHROMYCIN 250 MG PO TABS: ORAL_TABLET | ORAL | 0 refills | Status: AC

## 2017-10-14 NOTE — Progress Notes (Signed)
Date:  10/14/2017   Name:  Mark Hooper   DOB:  [birth year]   MRN:  [id number]   Chief Complaint: Loss of Consciousness Loss of Consciousness  This is a new problem. He lost consciousness for a period of 1 to 5 minutes. The symptoms are aggravated by standing (had been standing in line at the airport for 15-20 min.  He has not eaten or had any fluids that morning). Associated symptoms include light-headedness. Pertinent negatives include no abdominal pain, chest pain, dizziness, fever, headaches or palpitations.   He passed out for about 2 min at [employer] airport in [place].  Bystanders started CPR but he gained consciousness quickly.  He hit his head but had no other injury.  He was admitted to [facility] 10/12/17 to 10/13/17.  Troponins were negative.  CXR suggested right basilar pneumonia.   CT chest was negative for PE but showed basilar atelectasis.  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   CT brain was normal for age.  His WBC was elevated, BP mildly elevated. He was discharged home yesterday with no medications. He feels tired, slightly short of breath but no cough, mild chills without fever.  He had continued to smoke until Monday when he was hospitalized.  He intends to try his best to remain tobacco free.  He has patches at home to use if needed.     Review of Systems  Constitutional: Positive for chills and fatigue. Negative for fever.  Respiratory: Positive for shortness of breath. Negative for cough, chest tightness and wheezing.   Cardiovascular: Positive for syncope. Negative for chest pain and palpitations.  Gastrointestinal: Negative for abdominal pain.  Musculoskeletal: Negative for arthralgias.  Neurological: Positive for light-headedness. Negative for dizziness, tremors and headaches.  Psychiatric/Behavioral: Negative for dysphoric mood and sleep disturbance.    Patient Active Problem List   Diagnosis Date Noted  .  Benign prostatic hyperplasia with incomplete bladder emptying 02/05/2015  . Hx of colonic polyps   . Benign neoplasm of ascending colon   . Benign neoplasm of descending colon   . Benign neoplasm of sigmoid colon   . Tobacco use disorder 09/01/2014  . Basal cell carcinoma of nose 08/29/2014  . HLD (hyperlipidemia) 08/29/2014  . Plantar fasciitis 08/29/2014  . Hx of abdominal abscess 07/29/2013    No Known Allergies  Past Surgical History:  Procedure Laterality Date  . basal cell cancer excision    . CATARACT EXTRACTION    . COLONOSCOPY    . COLONOSCOPY WITH PROPOFOL N/A 10/27/2014   Procedure: COLONOSCOPY WITH PROPOFOL;  Surgeon: Lucilla Lame, MD;  Location: [place];  Service: Endoscopy;  Laterality: N/A;  . POLYPECTOMY  10/27/2014   Procedure: POLYPECTOMY;  Surgeon: Lucilla Lame, MD;  Location: [place];  Service: Endoscopy;;    Social History   Tobacco Use  . Smoking status: Current Every Day Smoker    Packs/day: 1.00    Years: 50.00    Pack years: 50.00    Types: Cigarettes  . Smokeless tobacco: Current User  Substance Use Topics  . Alcohol use: Yes    Alcohol/week: 5.0 standard drinks    Types: 1 Shots of liquor, 4 Standard drinks or equivalent per week    Comment: rare  . Drug use: Not on file     Medication list has been reviewed and updated.  Current Meds  Medication Sig  . ibuprofen (ADVIL,MOTRIN) 200 MG tablet Take 200 mg  by mouth as needed.  . simvastatin (ZOCOR) 20 MG tablet TAKE 1 TABLET DAILY    PHQ 2/9 Scores 10/14/2017 02/06/2016 09/01/2014  PHQ - 2 Score 0 0 0    Physical Exam  Constitutional: He is oriented to person, place, and time. He appears well-developed. He has a sickly appearance. No distress.  HENT:  Head: Normocephalic and atraumatic.  Eyes: Pupils are equal, round, and reactive to light.  Neck: Normal range of motion. Neck supple.  Cardiovascular: Normal rate, regular rhythm and normal heart sounds.    Pulmonary/Chest: Effort normal. No respiratory distress. He has decreased breath sounds in the right lower field and the left lower field. He has no wheezes. He has no rhonchi.  Musculoskeletal: Normal range of motion.  Neurological: He is alert and oriented to person, place, and time.  Skin: Skin is warm and dry. No rash noted.  Psychiatric: He has a normal mood and affect. His behavior is normal. Thought content normal.  Nursing note and vitals reviewed.   BP 108/62 (BP Location: Right Arm, Patient Position: Sitting, Cuff Size: Large)   Pulse (!) 110   Ht 6' (1.829 m)   Wt 239 lb (108.4 kg)   SpO2 94%   BMI 32.41 kg/m   Assessment and Plan: 1. Vaso vagal episode Pt to monitor BP at home Will follow up in 2 weeks  2. Pneumonia of right lower lobe due to infectious organism ([place]) - azithromycin (ZITHROMAX Z-PAK) 250 MG tablet; UAD  Dispense: 6 each; Refill: 0 - CBC with Differential/Platelet  3. Aortic dilatation ([place]) - Ambulatory referral to Cardiothoracic Surgery  4. Tobacco use disorder, mild, in early remission Pt encouraged to remain tobacco free   Meds ordered this encounter  Medications  . azithromycin (ZITHROMAX Z-PAK) 250 MG tablet    Sig: UAD    Dispense:  6 each    Refill:  0    Partially dictated using Editor, commissioning. Any errors are unintentional.  Halina Maidens, MD [facility]  10/14/2017

## 2017-10-14 NOTE — Patient Instructions (Signed)
Monitor BP - aim for top number to be 120- 140

## 2017-10-15 LAB — CBC WITH DIFFERENTIAL/PLATELET
BASOS ABS: 0.1 10*3/uL (ref 0.0–0.2)
Basos: 0 %
EOS (ABSOLUTE): 0 10*3/uL (ref 0.0–0.4)
Eos: 0 %
HEMOGLOBIN: 16.4 g/dL (ref 13.0–17.7)
Hematocrit: 46.8 % (ref 37.5–51.0)
Immature Grans (Abs): 0.2 10*3/uL — ABNORMAL HIGH (ref 0.0–0.1)
Immature Granulocytes: 1 %
LYMPHS ABS: 1.3 10*3/uL (ref 0.7–3.1)
Lymphs: 6 %
MCH: 32.1 pg (ref 26.6–33.0)
MCHC: 35 g/dL (ref 31.5–35.7)
MCV: 92 fL (ref 79–97)
MONOCYTES: 7 %
Monocytes Absolute: 1.6 10*3/uL — ABNORMAL HIGH (ref 0.1–0.9)
NEUTROS ABS: 18.7 10*3/uL — AB (ref 1.4–7.0)
Neutrophils: 86 %
Platelets: 287 10*3/uL (ref 150–450)
RBC: 5.11 x10E6/uL (ref 4.14–5.80)
RDW: 12.9 % (ref 12.3–15.4)
WBC: 21.9 10*3/uL (ref 3.4–10.8)

## 2017-10-26 ENCOUNTER — Encounter: Payer: Self-pay | Admitting: Internal Medicine

## 2017-10-26 ENCOUNTER — Ambulatory Visit (INDEPENDENT_AMBULATORY_CARE_PROVIDER_SITE_OTHER): Payer: Medicare Other | Admitting: Internal Medicine

## 2017-10-26 VITALS — BP 114/78 | HR 100 | Ht 72.0 in | Wt 236.0 lb

## 2017-10-26 DIAGNOSIS — N401 Enlarged prostate with lower urinary tract symptoms: Secondary | ICD-10-CM

## 2017-10-26 DIAGNOSIS — F17200 Nicotine dependence, unspecified, uncomplicated: Secondary | ICD-10-CM | POA: Diagnosis not present

## 2017-10-26 DIAGNOSIS — Z23 Encounter for immunization: Secondary | ICD-10-CM | POA: Diagnosis not present

## 2017-10-26 DIAGNOSIS — E782 Mixed hyperlipidemia: Secondary | ICD-10-CM

## 2017-10-26 DIAGNOSIS — R319 Hematuria, unspecified: Secondary | ICD-10-CM | POA: Diagnosis not present

## 2017-10-26 DIAGNOSIS — I77819 Aortic ectasia, unspecified site: Secondary | ICD-10-CM | POA: Diagnosis not present

## 2017-10-26 DIAGNOSIS — R3914 Feeling of incomplete bladder emptying: Secondary | ICD-10-CM

## 2017-10-26 LAB — POCT URINALYSIS DIPSTICK
Bilirubin, UA: NEGATIVE
GLUCOSE UA: NEGATIVE
Ketones, UA: NEGATIVE
LEUKOCYTES UA: NEGATIVE
Nitrite, UA: NEGATIVE
Protein, UA: NEGATIVE
RBC UA: NEGATIVE
SPEC GRAV UA: 1.025 (ref 1.010–1.025)
UROBILINOGEN UA: 0.2 U/dL
pH, UA: 6 (ref 5.0–8.0)

## 2017-10-26 NOTE — Progress Notes (Signed)
Date:  10/26/2017   Name:  Mark Hooper   DOB:  09-Mar-1945   MRN:  [id number]   Chief Complaint: Annual Exam Hyperlipidemia  This is a chronic problem. The problem is controlled. Factors aggravating his hyperlipidemia include smoking. Pertinent negatives include no chest pain, myalgias or shortness of breath. Current antihyperlipidemic treatment includes statins. The current treatment provides significant improvement of lipids.  Nicotine Dependence  Presents for follow-up visit. Symptoms are negative for fatigue. His urge triggers include company of smokers. He smokes < 1/2 a pack of cigarettes per day.  Benign Prostatic Hypertrophy  This is a recurrent problem. Irritative symptoms include frequency and urgency. Obstructive symptoms include incomplete emptying and an intermittent stream. Pertinent negatives include no chills, dysuria or hematuria. Past treatments include tamsulosin. The treatment provided no relief.      Review of Systems  Constitutional: Negative for appetite change, chills, diaphoresis, fatigue and unexpected weight change.  HENT: Negative for hearing loss, tinnitus, trouble swallowing and voice change.   Eyes: Negative for visual disturbance.  Respiratory: Negative for choking, shortness of breath and wheezing.   Cardiovascular: Negative for chest pain, palpitations and leg swelling.  Gastrointestinal: Negative for abdominal pain, blood in stool, constipation and diarrhea.  Genitourinary: Positive for difficulty urinating, frequency, incomplete emptying and urgency. Negative for dysuria and hematuria.  Musculoskeletal: Negative for arthralgias, back pain and myalgias.  Skin: Negative for color change and rash.  Neurological: Positive for light-headedness. Negative for dizziness, syncope and headaches.  Hematological: Negative for adenopathy.  Psychiatric/Behavioral: Negative for dysphoric mood and sleep disturbance.    Patient Active Problem List   Diagnosis  Date Noted  . Aortic dilatation ([place]) 10/14/2017  . Benign prostatic hyperplasia with incomplete bladder emptying 02/05/2015  . Hx of colonic polyps   . Benign neoplasm of ascending colon   . Benign neoplasm of descending colon   . Benign neoplasm of sigmoid colon   . Tobacco use disorder 09/01/2014  . Basal cell carcinoma of nose 08/29/2014  . HLD (hyperlipidemia) 08/29/2014  . Plantar fasciitis 08/29/2014  . Hx of abdominal abscess 07/29/2013    No Known Allergies  Past Surgical History:  Procedure Laterality Date  . basal cell cancer excision    . CATARACT EXTRACTION    . COLONOSCOPY    . COLONOSCOPY WITH PROPOFOL N/A 10/27/2014   Procedure: COLONOSCOPY WITH PROPOFOL;  Surgeon: Lucilla Lame, MD;  Location: [place];  Service: Endoscopy;  Laterality: N/A;  . POLYPECTOMY  10/27/2014   Procedure: POLYPECTOMY;  Surgeon: Lucilla Lame, MD;  Location: [place];  Service: Endoscopy;;    Social History   Tobacco Use  . Smoking status: Current Every Day Smoker    Packs/day: 1.00    Years: 50.00    Pack years: 50.00    Types: Cigarettes  . Smokeless tobacco: Current User  Substance Use Topics  . Alcohol use: Yes    Alcohol/week: 5.0 standard drinks    Types: 1 Shots of liquor, 4 Standard drinks or equivalent per week    Comment: rare  . Drug use: Not on file     Medication list has been reviewed and updated.  Current Meds  Medication Sig  . ibuprofen (ADVIL,MOTRIN) 200 MG tablet Take 200 mg by mouth as needed.  . simvastatin (ZOCOR) 20 MG tablet TAKE 1 TABLET DAILY    PHQ 2/9 Scores 10/26/2017 10/14/2017 02/06/2016 09/01/2014  PHQ - 2 Score 0 0 0 0    Physical Exam  Constitutional: He is oriented to person, place, and time. He appears well-developed and well-nourished.  HENT:  Head: Normocephalic.  Right Ear: Tympanic membrane, external ear and ear canal normal.  Left Ear: Tympanic membrane, external ear and ear canal normal.  Nose: Nose normal.    Mouth/Throat: Uvula is midline and oropharynx is clear and moist.  Eyes: Pupils are equal, round, and reactive to light. Conjunctivae and EOM are normal.  Neck: Normal range of motion. Neck supple. Carotid bruit is not present. No thyromegaly present.  Cardiovascular: Normal rate, regular rhythm, normal heart sounds and intact distal pulses.  Pulmonary/Chest: Effort normal and breath sounds normal. He has no wheezes. Right breast exhibits no mass. Left breast exhibits no mass.  Abdominal: Soft. Normal appearance and bowel sounds are normal. There is no hepatosplenomegaly. There is no tenderness.  Musculoskeletal: He exhibits no edema or tenderness.  Lymphadenopathy:    He has no cervical adenopathy.  Neurological: He is alert and oriented to person, place, and time. He has normal reflexes.  Skin: Skin is warm, dry and intact.  Psychiatric: He has a normal mood and affect. His speech is normal and behavior is normal. Judgment and thought content normal.  Nursing note and vitals reviewed.   BP 114/78 (BP Location: Right Arm, Patient Position: Sitting, Cuff Size: Large)   Pulse 100   Ht 6' (1.829 m)   Wt 236 lb (107 kg)   SpO2 96%   BMI 32.01 kg/m   Assessment and Plan: 1. Aortic dilatation (HCC) Waiting for vascular surgery appt - CBC with Differential/Platelet  2. Benign prostatic hyperplasia with incomplete bladder emptying - PSA - Ambulatory referral to Urology  3. Mixed hyperlipidemia Continue statin therapy - Comprehensive metabolic panel - Lipid panel  4. Tobacco use disorder Had CT chest for workup of syncope - no suspicion of malignancy Continue to use patches and cut back on smoking to stop completely  5. Hematuria, unspecified type Recheck UA today - reported to pt during hospital stay in [place] - POCT urinalysis dipstick  6. Need for influenza vaccination - Flu vaccine HIGH DOSE PF   No orders of the defined types were placed in this encounter.   Partially  dictated using Editor, commissioning. Any errors are unintentional.  Halina Maidens, MD [facility]  10/26/2017

## 2017-10-27 LAB — CBC WITH DIFFERENTIAL/PLATELET
BASOS ABS: 0.1 10*3/uL (ref 0.0–0.2)
Basos: 1 %
EOS (ABSOLUTE): 0.1 10*3/uL (ref 0.0–0.4)
Eos: 1 %
Hematocrit: 49.6 % (ref 37.5–51.0)
Hemoglobin: 17 g/dL (ref 13.0–17.7)
IMMATURE GRANS (ABS): 0.2 10*3/uL — AB (ref 0.0–0.1)
Immature Granulocytes: 2 %
LYMPHS ABS: 2.9 10*3/uL (ref 0.7–3.1)
LYMPHS: 25 %
MCH: 31.7 pg (ref 26.6–33.0)
MCHC: 34.3 g/dL (ref 31.5–35.7)
MCV: 93 fL (ref 79–97)
Monocytes Absolute: 0.6 10*3/uL (ref 0.1–0.9)
Monocytes: 6 %
NEUTROS ABS: 7.5 10*3/uL — AB (ref 1.4–7.0)
Neutrophils: 65 %
PLATELETS: 527 10*3/uL — AB (ref 150–450)
RBC: 5.36 x10E6/uL (ref 4.14–5.80)
RDW: 13 % (ref 12.3–15.4)
WBC: 11.4 10*3/uL — AB (ref 3.4–10.8)

## 2017-10-27 LAB — PSA: PROSTATE SPECIFIC AG, SERUM: 4.1 ng/mL — AB (ref 0.0–4.0)

## 2017-10-27 LAB — COMPREHENSIVE METABOLIC PANEL
A/G RATIO: 1.1 — AB (ref 1.2–2.2)
ALT: 25 IU/L (ref 0–44)
AST: 18 IU/L (ref 0–40)
Albumin: 3.9 g/dL (ref 3.5–4.8)
Alkaline Phosphatase: 109 IU/L (ref 39–117)
BUN/Creatinine Ratio: 13 (ref 10–24)
BUN: 13 mg/dL (ref 8–27)
Bilirubin Total: 0.2 mg/dL (ref 0.0–1.2)
CALCIUM: 10 mg/dL (ref 8.6–10.2)
CO2: 19 mmol/L — ABNORMAL LOW (ref 20–29)
CREATININE: 1.03 mg/dL (ref 0.76–1.27)
Chloride: 100 mmol/L (ref 96–106)
GFR, EST AFRICAN AMERICAN: 84 mL/min/{1.73_m2} (ref >59)
GFR, EST NON AFRICAN AMERICAN: 73 mL/min/{1.73_m2} (ref >59)
GLOBULIN, TOTAL: 3.7 g/dL (ref 1.5–4.5)
Glucose: 97 mg/dL (ref 65–99)
Potassium: 5.3 mmol/L — ABNORMAL HIGH (ref 3.5–5.2)
SODIUM: 140 mmol/L (ref 134–144)
TOTAL PROTEIN: 7.6 g/dL (ref 6.0–8.5)

## 2017-10-27 LAB — LIPID PANEL
CHOL/HDL RATIO: 3.7 ratio (ref 0.0–5.0)
Cholesterol, Total: 134 mg/dL (ref 100–199)
HDL: 36 mg/dL — ABNORMAL LOW (ref >39)
LDL CALC: 65 mg/dL (ref 0–99)
TRIGLYCERIDES: 164 mg/dL — AB (ref 0–149)
VLDL Cholesterol Cal: 33 mg/dL (ref 5–40)

## 2017-12-01 ENCOUNTER — Encounter: Payer: Self-pay | Admitting: Thoracic Surgery (Cardiothoracic Vascular Surgery)

## 2017-12-01 ENCOUNTER — Other Ambulatory Visit: Payer: Self-pay | Admitting: *Deleted

## 2017-12-01 ENCOUNTER — Institutional Professional Consult (permissible substitution) (INDEPENDENT_AMBULATORY_CARE_PROVIDER_SITE_OTHER): Payer: Medicare Other | Admitting: Thoracic Surgery (Cardiothoracic Vascular Surgery)

## 2017-12-01 VITALS — BP 123/87 | HR 87 | Resp 20 | Ht 72.0 in | Wt 239.0 lb

## 2017-12-01 DIAGNOSIS — I712 Thoracic aortic aneurysm, without rupture, unspecified: Secondary | ICD-10-CM

## 2017-12-01 NOTE — Progress Notes (Signed)
PCP is Glean Hess, MD Referring Provider is Glean Hess, MD  Chief Complaint  Patient presents with  . Thoracic Aortic Aneurysm    Surgical eval on 4cm aneurysmal dilation seen on CTA Chest 10/12/17 and TEE 10/13/17 -[place]     HPI: Mr. Mark Hooper is sent for consultation regarding a thoracic aortic aneurysm.  Arham Symmonds is a 72 year old man with a history of tobacco abuse, dyslipidemia, congestive heart failure, deep venous thrombosis, diverticulosis, and basal cell cancer.  He has no prior cardiac history.  In August he was at [employer] airport in [place] when he had a syncopal spell while waiting in line.  He was taken to the hospital in [place] [facility].  Work-up included CT of the brain which showed no acute findings.  Chest x-ray showed some basilar airspace disease.  A CT angiogram of the chest showed no evidence of pulmonary embolus.  There was significant atherosclerotic disease involving the distal arch and descending thoracic aorta with aneurysmal dilatation to 4 cm.  He denies any chest or back pain or shortness of breath around the time of the procedure he was not having any fevers or chills.  He was told he had pneumonia but was not treated with antibiotics prior to discharge.  He did get treated with antibiotics once he came back to [place].  He is smoked 1 pack of cigarettes daily for the past 52 years.  He is tried to quit on multiple occasions unsuccessfully.  Past Medical History:  Diagnosis Date  . Aneurysm, aortic ([place])   . CHF (congestive heart failure) ([place])   . Diverticulitis   . DVT (deep venous thrombosis) ([place])   . Hypercholesteremia   . Wears dentures    partial upper    Past Surgical History:  Procedure Laterality Date  . basal cell cancer excision    . CATARACT EXTRACTION    . COLONOSCOPY    . COLONOSCOPY WITH PROPOFOL N/A 10/27/2014   Procedure: COLONOSCOPY WITH PROPOFOL;  Surgeon: Lucilla Lame, MD;  Location: [place];  Service: Endoscopy;  Laterality: N/A;  . POLYPECTOMY  10/27/2014   Procedure: POLYPECTOMY;  Surgeon: Lucilla Lame, MD;  Location: [place];  Service: Endoscopy;;    Family History  Problem Relation Age of Onset  . Hyperlipidemia Father   . CAD Father     Social History Social History   Tobacco Use  . Smoking status: Current Every Day Smoker    Packs/day: 1.00    Years: 50.00    Pack years: 50.00    Types: Cigarettes  . Smokeless tobacco: Never Used  Substance Use Topics  . Alcohol use: Yes    Alcohol/week: 5.0 standard drinks    Types: 1 Shots of liquor, 4 Standard drinks or equivalent per week    Comment: rare  . Drug use: Not on file    Current Outpatient Medications  Medication Sig Dispense Refill  . simvastatin (ZOCOR) 20 MG tablet TAKE 1 TABLET DAILY 90 tablet 0  . ibuprofen (ADVIL,MOTRIN) 200 MG tablet Take 200 mg by mouth as needed.     No current facility-administered medications for this visit.     No Known Allergies  Review of Systems  Constitutional: Negative for activity change, appetite change and unexpected weight change.  HENT: Negative for trouble swallowing and voice change.   Eyes: Negative for visual disturbance.  Respiratory: Negative for chest tightness and shortness of breath.   Cardiovascular: Negative for chest pain and  palpitations.  Gastrointestinal: Negative for abdominal distention and abdominal pain.  Genitourinary: Positive for frequency. Negative for dysuria.  Musculoskeletal: Negative for arthralgias and back pain.  Neurological: Positive for syncope. Negative for seizures and weakness.  Hematological: Negative for adenopathy. Does not bruise/bleed easily.    BP 123/87   Pulse 87   Resp 20   Ht 6' (1.829 m)   Wt 239 lb (108.4 kg)   SpO2 96% Comment: RA  BMI 32.41 kg/m  Physical Exam  Constitutional: He is oriented to person, place, and time. He appears well-developed and well-nourished. No distress.  HENT:   Head: Normocephalic and atraumatic.  Eyes: Pupils are equal, round, and reactive to light. Conjunctivae and EOM are normal. No scleral icterus.  Neck: Neck supple. No thyromegaly present.  Cardiovascular: Normal rate, regular rhythm, normal heart sounds and intact distal pulses. Exam reveals no gallop and no friction rub.  No murmur heard. Pulmonary/Chest: Effort normal and breath sounds normal. No respiratory distress. He has no wheezes. He has no rales.  Abdominal: Soft. He exhibits no distension. There is no tenderness.  Musculoskeletal: He exhibits no edema.  Lymphadenopathy:    He has no cervical adenopathy.  Neurological: He is alert and oriented to person, place, and time. No cranial nerve deficit. He exhibits normal muscle tone. Coordination normal.  Skin: Skin is warm and dry.  Vitals reviewed.   Diagnostic Tests: I personally reviewed the CT angiogram from [facility] which he brought on a disc The official report reads Impression 1.  No pulmonary emboli identified. 2.  Emphysema noted. 3.  Mild right and very mild left basilar atelectasis noted. 4.  There is prominent atherosclerosis of the aorta and its branches including some prominent ulceration and aneurysmal dilatation involving the aortic arch and descending aorta.  No evidence of aortic rupture seen. 5.  Other findings as described. Addendum Aneurysmal dilatation measures 4 cm maximally at the level of the distal aortic arch.  Impression: Mark Hooper is a 72 year old gentleman with a history of hyperlipidemia, tobacco abuse, digestive heart failure, deep venous thrombosis, and diverticulitis.  He had a syncopal spell about 2 months ago when [facility] airport.  He was taken to the hospital in [facility].  He did not have any prodromal symptoms prior to the event nor did he have any chest or back pain after the event.  As part of his evaluation he had a CT angiogram looking for pulmonary embolus.  There is no  evidence of pulmonary emboli, but there was a heavily atherosclerotic distal aortic arch and descending thoracic aorta with what appeared to be multiple penetrating atherosclerotic ulcers.  There is no evidence of dissection.  It is unclear if the penetrating ulcers are related to his event in the airport or not, but most likely this is just incidental finding.  I reviewed the CT images with Mr. Ferrebee.  There is no indication for surgery at this time.  I did emphasize the importance of tobacco cessation as this type of aortic atherosclerotic disease is almost always associated with smoking.  He does not have any history of hypertension.  He is on appropriate treatment for his hyperlipidemia.  He needs a repeat CT scan at 16months, which will be 1 month from now.  Assuming that is stable he will need a another CT at 6 months and then annually after that  Tobacco abuse- Smoking cessation instruction/counseling given:  counseled patient on the dangers of tobacco use, advised patient to stop smoking,  and reviewed strategies to maximize success.  He will talk to Dr. Army Melia about possibly trying Chantix again.  Plan: Return in 1 month for a 52-month follow-up CT angiogram of the chest.  [name], MD [facility] [phone]

## 2017-12-01 NOTE — Progress Notes (Unsigned)
ct 

## 2017-12-04 ENCOUNTER — Ambulatory Visit: Payer: Self-pay | Admitting: Urology

## 2017-12-25 ENCOUNTER — Other Ambulatory Visit
Admission: RE | Admit: 2017-12-25 | Discharge: 2017-12-25 | Disposition: A | Discharge status: Home / Self Care | Payer: Medicare Other | Source: Ambulatory Visit | Attending: Urology | Admitting: Urology

## 2017-12-25 ENCOUNTER — Ambulatory Visit (INDEPENDENT_AMBULATORY_CARE_PROVIDER_SITE_OTHER): Payer: Medicare Other | Admitting: Urology

## 2017-12-25 ENCOUNTER — Other Ambulatory Visit: Payer: Self-pay | Admitting: Family Medicine

## 2017-12-25 ENCOUNTER — Encounter: Payer: Self-pay | Admitting: Urology

## 2017-12-25 VITALS — BP 139/76 | HR 76 | Ht 72.0 in | Wt 249.0 lb

## 2017-12-25 DIAGNOSIS — N401 Enlarged prostate with lower urinary tract symptoms: Secondary | ICD-10-CM

## 2017-12-25 DIAGNOSIS — R0683 Snoring: Secondary | ICD-10-CM | POA: Diagnosis not present

## 2017-12-25 DIAGNOSIS — N400 Enlarged prostate without lower urinary tract symptoms: Secondary | ICD-10-CM

## 2017-12-25 DIAGNOSIS — N138 Other obstructive and reflux uropathy: Secondary | ICD-10-CM | POA: Diagnosis not present

## 2017-12-25 DIAGNOSIS — R351 Nocturia: Secondary | ICD-10-CM | POA: Diagnosis not present

## 2017-12-25 DIAGNOSIS — R972 Elevated prostate specific antigen [PSA]: Secondary | ICD-10-CM | POA: Insufficient documentation

## 2017-12-25 DIAGNOSIS — N5203 Combined arterial insufficiency and corporo-venous occlusive erectile dysfunction: Secondary | ICD-10-CM

## 2017-12-25 LAB — URINALYSIS, COMPLETE (UACMP) WITH MICROSCOPIC
Bacteria, UA: NONE SEEN
Bilirubin Urine: NEGATIVE
Glucose, UA: NEGATIVE mg/dL
Hgb urine dipstick: NEGATIVE
KETONES UR: NEGATIVE mg/dL
Leukocytes, UA: NEGATIVE
Nitrite: NEGATIVE
PROTEIN: NEGATIVE mg/dL
RBC / HPF: NONE SEEN RBC/hpf (ref 0–5)
SQUAMOUS EPITHELIAL / LPF: NONE SEEN (ref 0–5)
Specific Gravity, Urine: >1.03 — ABNORMAL HIGH (ref 1.005–1.030)
pH: 5 (ref 5.0–8.0)

## 2017-12-25 LAB — PSA: Prostatic Specific Antigen: 2.17 ng/mL (ref 0.00–4.00)

## 2017-12-25 LAB — BLADDER SCAN AMB NON-IMAGING: Scan Result: 11

## 2017-12-25 MED ORDER — TADALAFIL 5 MG PO TABS: 5.0000 mg | ORAL_TABLET | Freq: Every day | ORAL | 0 refills | Status: DC | PRN

## 2017-12-25 NOTE — Progress Notes (Signed)
12/25/2017 2:12 PM   Mark Hooper 1945-07-22 [id number]  Referring provider: Glean Hess, MD [address] [place], [place] [address]  Chief Complaint  Patient presents with  . Benign Prostatic Hypertrophy    HPI: 72 year old male referred for further evaluation of elevated PSA and BPH.  Routine annual PSA screening by his primary care physician, Dr. Army Melia.  His PSA was noted to be elevated to 4.1 on 10/26/2017 which was up significantly from 2.22 years ago.  PSA trend as below.  He complaints today of weak stream that stops and starts.  He has urinary frequency and nocturia x6-8 times.  No UTIs or history of bladder stones.  No dysuria or gross hematuria.  No incontinence.  IPSS as below.  Symptoms are exacerbated by drinking.     He does not feel rested in the mornings.  He does snore loudly.  He is never had a sleep study.  He denies any lower extremity edema.  He is not on any diuretics.  He has tried flomax a few years ago which did not make any difference.    He has had urinary symptoms for past 4-5 years which has progressed.    He has a personal history of erectile dysfunction.  He reports that this is been going on for at least 8 or 9 years.  He has tried Viagra in the past but did not like the side effects.  He is interested in alternative therapies.  He has difficulty maintaining and achieving erections.   Component     Latest Ref Rng & Units 02/05/2015 02/06/2016 10/26/2017  Prostate Specific Ag, Serum     0.0 - 4.0 ng/mL 1.7 2.2 4.1 (H)    IPSS    Row Name 12/25/17 1300         International Prostate Symptom Score   How often have you had the sensation of not emptying your bladder?  Almost always     How often have you had to urinate less than every two hours?  More than half the time     How often have you found you stopped and started again several times when you urinated?  More than half the time     How often have you found it difficult  to postpone urination?  About half the time     How often have you had a weak urinary stream?  Almost always     How often have you had to strain to start urination?  More than half the time     How many times did you typically get up at night to urinate?  5 Times     Total IPSS Score  30       Quality of Life due to urinary symptoms   If you were to spend the rest of your life with your urinary condition just the way it is now how would you feel about that?  Mostly Disatisfied        Score:  1-7 Mild 8-19 Moderate 20-35 Severe  PMH: Past Medical History:  Diagnosis Date  . Aneurysm, aortic ([facility])   . CHF (congestive heart failure) ([facility])   . Diverticulitis   . DVT (deep venous thrombosis) ([facility])   . Hypercholesteremia   . Wears dentures    partial upper    Surgical History: Past Surgical History:  Procedure Laterality Date  . basal cell cancer excision    . CATARACT EXTRACTION    .  COLONOSCOPY    . COLONOSCOPY WITH PROPOFOL N/A 10/27/2014   Procedure: COLONOSCOPY WITH PROPOFOL;  Surgeon: Lucilla Lame, MD;  Location: [facility];  Service: Endoscopy;  Laterality: N/A;  . POLYPECTOMY  10/27/2014   Procedure: POLYPECTOMY;  Surgeon: Lucilla Lame, MD;  Location: [facility];  Service: Endoscopy;;    Home Medications:  Allergies as of 12/25/2017   No Known Allergies     Medication List        Accurate as of 12/25/17  2:12 PM. Always use your most recent med list.          ibuprofen 200 MG tablet Commonly known as:  ADVIL,MOTRIN Take 200 mg by mouth as needed.   simvastatin 20 MG tablet Commonly known as:  ZOCOR TAKE 1 TABLET DAILY   tadalafil 5 MG tablet Commonly known as:  CIALIS Take 1 tablet (5 mg total) by mouth daily as needed for erectile dysfunction.       Allergies: No Known Allergies  Family History: Family History  Problem Relation Age of Onset  . Hyperlipidemia Father   . CAD Father   . Prostate cancer Neg Hx   . Bladder Cancer  Neg Hx   . Kidney cancer Neg Hx     Social History:  reports that he has been smoking cigarettes. He has a 50.00 pack-year smoking history. He has never used smokeless tobacco. He reports that he drinks about 5.0 standard drinks of alcohol per week. His drug history is not on file.  ROS: UROLOGY Frequent Urination?: Yes Hard to postpone urination?: Yes Burning/pain with urination?: Yes Get up at night to urinate?: Yes Leakage of urine?: No Urine stream starts and stops?: Yes Trouble starting stream?: No Do you have to strain to urinate?: No Blood in urine?: No Urinary tract infection?: No Sexually transmitted disease?: No Injury to kidneys or bladder?: No Painful intercourse?: No Weak stream?: Yes Erection problems?: Yes Penile pain?: No  Gastrointestinal Nausea?: No Vomiting?: No Indigestion/heartburn?: No Diarrhea?: No Constipation?: No  Constitutional Fever: No Night sweats?: No Weight loss?: No Fatigue?: No  Skin Skin rash/lesions?: No Itching?: No  Eyes Blurred vision?: No Double vision?: No  Ears/Nose/Throat Sore throat?: No Sinus problems?: No  Hematologic/Lymphatic Swollen glands?: No Easy bruising?: No  Cardiovascular Leg swelling?: No Chest pain?: No  Respiratory Cough?: No Shortness of breath?: No  Endocrine Excessive thirst?: No  Musculoskeletal Back pain?: No Joint pain?: No  Neurological Headaches?: No Dizziness?: No  Psychologic Depression?: No Anxiety?: No  Physical Exam: BP 139/76 (BP Location: Left Arm, Patient Position: Sitting, Cuff Size: Large)   Pulse 76   Ht 6' (1.829 m)   Wt 249 lb (112.9 kg)   BMI 33.77 kg/m   Constitutional:  Alert and oriented, No acute distress. HEENT: [place] AT, moist mucus membranes.  Trachea midline, no masses. Cardiovascular: No clubbing, cyanosis, or edema. Respiratory: Normal respiratory effort, no increased work of breathing. GI: Abdomen is soft, nontender, nondistended, no  abdominal masses GU: No CVA tenderness Rectal: Normal sphincter tone, 50 cc prostate, nontender, no nodules Skin: No rashes, bruises or suspicious lesions. Neurologic: Grossly intact, no focal deficits, moving all 4 extremities. Psychiatric: Normal mood and affect.  Laboratory Data: Lab Results  Component Value Date   WBC 11.4 (H) 10/26/2017   HGB 17.0 10/26/2017   HCT 49.6 10/26/2017   MCV 93 10/26/2017   PLT 527 (H) 10/26/2017    Lab Results  Component Value Date   CREATININE 1.03 10/26/2017  PSA trend as above  Urinalysis    Component Value Date/Time   BILIRUBINUR neg 10/26/2017 1108   PROTEINUR Negative 10/26/2017 1108   UROBILINOGEN 0.2 10/26/2017 1108   NITRITE neg 10/26/2017 1108   LEUKOCYTESUR Negative 10/26/2017 1108    Pertinent Imaging: Results for orders placed or performed in visit on 12/25/17  Bladder Scan (Post Void Residual) in office  Result Value Ref Range   Scan Result 11     Assessment & Plan:    1. Benign prostatic hyperplasia with urinary obstruction Enlarged gland appreciated on rectal exam today with obstructive voiding symptoms in addition to irritative voiding symptoms We discussed treatment options including pharmacotherapy versus surgical intervention He does not like the idea of being on chronic medications and may consider an outlet procedure if this is deemed appropriate We will set him up for cystoscopy and prostate sizing in about a month for further assessment He does have a personal history of erectile dysfunction and is interested in the possibility of Cialis 5 mg daily to treat both his urinary symptoms as well as his ED - Bladder Scan (Post Void Residual) in office - PSA; Future  2. Nocturia Excessive nocturia Patient is a snorer and may have a component of sleep apnea is contributing factor I have recommended that he follow-up with his primary care physician and consider a sleep study Behavioral modification including  avoiding beverages prior to bed were also reviewed  3. Snoring As above  4. Elevated PSA  We reviewed the implications of an elevated PSA and the uncertainty surrounding it. In general, a man's PSA increases with age and is produced by both normal and cancerous prostate tissue. Differential for elevated PSA is BPH, prostate cancer, infection, recent intercourse/ejaculation, prostate infarction, recent urethroscopic manipulation (foley placement/cystoscopy) and prostatitis. Management of an elevated PSA can include observation or prostate biopsy and wediscussed this in detail. We discussed that indications for prostate biopsy are defined by age and race specific PSA cutoffs as well as a PSA velocity of 0.75/year.  Plan repeat PSA today.  We will determine follow-up plan based on his PSA results today. 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  Hollice Espy, MD  [facility] [address], [place], [place] [phone]

## 2017-12-28 ENCOUNTER — Telehealth: Payer: Self-pay

## 2017-12-28 NOTE — Telephone Encounter (Signed)
Left message for pt to call our office to discuss options for covering Tadalafil. Insurance will not cover this. Pt  Can try GoodRx,

## 2017-12-28 NOTE — Telephone Encounter (Signed)
-----   Message from Hollice Espy, MD sent at 12/28/2017  8:41 AM EST ----- Also news, your PSA is down to 2.17.  This is very reassuring.  I will see in December as planned to continue to evaluate and treat your urinary symptoms.  Hollice Espy, MD

## 2017-12-28 NOTE — Telephone Encounter (Signed)
PSA is down to 2.17. This is very reassuring. I will see in December as planned to continue to evaluate and treat your urinary symptoms.  Mychart message sent to patient.

## 2018-01-05 ENCOUNTER — Ambulatory Visit: Payer: Medicare Other | Admitting: Thoracic Surgery (Cardiothoracic Vascular Surgery)

## 2018-01-05 ENCOUNTER — Other Ambulatory Visit: Payer: Medicare Other

## 2018-02-02 ENCOUNTER — Encounter: Payer: Self-pay | Admitting: Thoracic Surgery (Cardiothoracic Vascular Surgery)

## 2018-02-02 ENCOUNTER — Ambulatory Visit (INDEPENDENT_AMBULATORY_CARE_PROVIDER_SITE_OTHER): Payer: Medicare Other | Admitting: Thoracic Surgery (Cardiothoracic Vascular Surgery)

## 2018-02-02 ENCOUNTER — Ambulatory Visit: Payer: Self-pay | Admitting: Urology

## 2018-02-02 ENCOUNTER — Other Ambulatory Visit: Payer: Self-pay

## 2018-02-02 ENCOUNTER — Ambulatory Visit
Admission: RE | Admit: 2018-02-02 | Discharge: 2018-02-02 | Disposition: A | Discharge status: Home / Self Care | Payer: Medicare Other | Source: Ambulatory Visit | Attending: Thoracic Surgery (Cardiothoracic Vascular Surgery) | Admitting: Thoracic Surgery (Cardiothoracic Vascular Surgery)

## 2018-02-02 ENCOUNTER — Encounter: Payer: Self-pay | Admitting: Urology

## 2018-02-02 ENCOUNTER — Ambulatory Visit (INDEPENDENT_AMBULATORY_CARE_PROVIDER_SITE_OTHER): Payer: Medicare Other | Admitting: Urology

## 2018-02-02 VITALS — BP 131/88 | HR 102 | Ht 72.0 in | Wt 244.0 lb

## 2018-02-02 DIAGNOSIS — I712 Thoracic aortic aneurysm, without rupture, unspecified: Secondary | ICD-10-CM

## 2018-02-02 DIAGNOSIS — N401 Enlarged prostate with lower urinary tract symptoms: Secondary | ICD-10-CM | POA: Diagnosis not present

## 2018-02-02 DIAGNOSIS — I700 Atherosclerosis of aorta: Secondary | ICD-10-CM

## 2018-02-02 DIAGNOSIS — R351 Nocturia: Secondary | ICD-10-CM

## 2018-02-02 DIAGNOSIS — N138 Other obstructive and reflux uropathy: Secondary | ICD-10-CM | POA: Diagnosis not present

## 2018-02-02 DIAGNOSIS — I714 Abdominal aortic aneurysm, without rupture, unspecified: Secondary | ICD-10-CM | POA: Insufficient documentation

## 2018-02-02 HISTORY — DX: Atherosclerosis of aorta: I70.0

## 2018-02-02 HISTORY — DX: Abdominal aortic aneurysm, without rupture: I71.4

## 2018-02-02 HISTORY — DX: Abdominal aortic aneurysm, without rupture, unspecified: I71.40

## 2018-02-02 LAB — URINALYSIS, COMPLETE
Bilirubin, UA: NEGATIVE
Glucose, UA: NEGATIVE
Ketones, UA: NEGATIVE
Leukocytes, UA: NEGATIVE
NITRITE UA: NEGATIVE
PH UA: 5.5 (ref 5.0–7.5)
RBC, UA: NEGATIVE
Urobilinogen, Ur: 0.2 mg/dL (ref 0.2–1.0)

## 2018-02-02 LAB — MICROSCOPIC EXAMINATION: WBC, UA: NONE SEEN /hpf (ref 0–5)

## 2018-02-02 MED ORDER — FINASTERIDE 5 MG PO TABS: 5.0000 mg | ORAL_TABLET | Freq: Every day | ORAL | 11 refills | Status: DC

## 2018-02-02 MED ORDER — IOPAMIDOL (ISOVUE-370) INJECTION 76%
75.0000 mL | Freq: Once | INTRAVENOUS | Status: AC | PRN
  Administered 2018-02-02: 75 mL via INTRAVENOUS

## 2018-02-02 NOTE — Progress Notes (Signed)
[address]       [place] [address]             [phone]     HPI: Mark Hooper returns for follow-up regarding his aortic atherosclerotic disease.  Mark Hooper is a 72 year old man with a history old man with a history of tobacco abuse, dyslipidemia, congestive heart failure, deep venous thrombosis, diverticulosis, and basal cell cancer.  Back in August 2019 he had a syncopal spell while waiting in line at the airport.  He was evaluated in [place], [place].  A CT angiogram of the chest showed atherosclerotic disease involving the distal arch and descending thoracic aorta.   I saw him in October.  He was stable at that time and had not had any further syncopal episodes.  I recommended a repeat CT scan at 3 months from the original scan.  He is not having any back or chest pain.  He is not had any further syncope.  He continues to smoke.  He is trying patches but has not been able to give up cigarettes.  Past Medical History:  Diagnosis Date  . Abdominal aortic aneurysm (AAA) ([facility]) 02/02/2018  . Aneurysm, aortic ([facility])   . CHF (congestive heart failure) ([facility])   . Diverticulitis   . DVT (deep venous thrombosis) ([facility])   . Hypercholesteremia   . Thoracic aortic atherosclerosis ([place]) 02/02/2018  . Wears dentures    partial upper     Current Outpatient Medications  Medication Sig Dispense Refill  . finasteride (PROSCAR) 5 MG tablet Take 1 tablet (5 mg total) by mouth daily. 30 tablet 11  . ibuprofen (ADVIL,MOTRIN) 200 MG tablet Take 200 mg by mouth as needed.    . simvastatin (ZOCOR) 20 MG tablet TAKE 1 TABLET DAILY 90 tablet 0   No current facility-administered medications for this visit.     Physical Exam BP (!) 128/92 (BP Location: Left Arm, Patient Position: Sitting, Cuff Size: Normal)   Pulse 77   Resp 18   Ht 6' (1.829 m)   Wt 243 lb 6.4 oz (110.4 kg)   SpO2 95% Comment: RA  BMI 33.77 kg/m  72 year old man in no acute distress Alert and oriented x3 with no focal  deficits Cardiac regular rate and rhythm normal S1 and S2 Lungs diminished bilaterally but clear No peripheral edema  Diagnostic Tests: CT ANGIOGRAPHY CHEST WITH CONTRAST  TECHNIQUE: Multidetector CT imaging of the chest was performed using the standard protocol during bolus administration of intravenous contrast. Multiplanar CT image reconstructions and MIPs were obtained to evaluate the vascular anatomy.  CONTRAST:  50mL ISOVUE-370 IOPAMIDOL (ISOVUE-370) INJECTION 76%  COMPARISON:  None  Creatinine was obtained on site at [facility] at [address]  Results: Creatinine 0.9 mg/dL.  GFR of 86.  FINDINGS: Cardiovascular: There is good opacification of the thoracic aorta. Marked irregularity of the lumen is noted consistent with severe atheromatous change versus possible ulcerated plaque. No dissection is seen. The mid ascending thoracic aorta measures 35 mm in diameter. No definite thoracic aortic aneurysm is seen. Significant atheromatous change extends throughout the descending thoracic aorta as well and ulcerated plaque can not be excluded. The heart is within normal limits in size. Faint coronary artery calcifications are noted. No pericardial effusion is seen.  The pulmonary arteries are well opacified with no significant abnormality noted.  Mediastinum/Nodes: There are small mediastinal lymph nodes present but no lymphadenopathy is noted. Thyroid gland is unremarkable. No abnormality of the thoracic esophagus is  evident.  Lungs/Pleura: There is a small ground-glass opacity within the right upper lobe anteriorly and laterally better seen on the sagittal images number 93 series 12 measuring 8 mm in diameter. Minimal scarring /nodularity is present in the superior segment of the right lower lobe as noted on images 93 series 7. In addition, vague noncalcified nodules are present as on image 98 series 7 in the right lower lobe, image 77 in the  right upper lobe, image 75 along the major fissure in the right upper lobe and image 67 within the right upper lobe. Additional small nodules are noted in the left upper lobe and left lower lobe as well. These changes may well be postinflammatory or post infectious but follow-up CT the chest in 4-6 months is recommended. Minimal scarring is present in the left lower lobe posteriorly on image 79 series 7. No pneumonia or pleural effusion is seen. The central airway is patent  Upper Abdomen: The liver is unremarkable and no calcified gallstones are seen. The pancreas has multiple calcifications suggestive of chronic calcific pancreatitis. There is focal enlargement of the infrarenal abdominal aorta where it measures 3.9 x 4.0 cm consistent with infrarenal abdominal aortic aneurysm.  Musculoskeletal: There is mild degenerative change in the mid to lower thoracic spine.  Review of the MIP images confirms the above findings.  IMPRESSION: 1. Significant irregularity of the lumen of the entire thoracic and upper abdominal aorta consistent with severe diffuse atheromatous plaque formation, although ulcerated plaque can not be excluded on this exam. 2. Incidentally noted is an infrarenal abdominal aortic aneurysm of 3.9 x 4.0 cm. Recommend followup by ultrasound in 3 years. This recommendation follows ACR consensus guidelines: White Paper of the ACR Incidental Findings Committee II on Vascular Findings. J Am Coll Radiol 2013; 10:789-794. 3. The mid ascending thoracic aorta measures only 35 mm in diameter. No aneurysm of the thoracic aorta is seen. 4. Multiple small noncalcified lung nodules some of which are faint and possibly ground-glass. Recommend follow-up CT of the chest in 4-6 months to assess stability. These most likely are postinflammatory or post infectious in etiology. 5. Changes of chronic calcific pancreatitis.   Electronically Signed   By: Ivar Drape M.D.   On:  02/02/2018 15:47 I personally reviewed the CT images and concur with the findings noted above  Impression: Mark Hooper is a 72 year old gentleman with a past history significant for DVT, hyperlipidemia, tobacco abuse, congestive heart failure, and thoracic aortic atherosclerosis.  His thoracic aortic disease was first noted back in August when he had a syncopal spell at the airport in [place].  He was treated in [place].  A CT angiogram ruled out pulmonary embolus but did show severe arch and descending thoracic aortic atherosclerotic disease.  His CT today shows severe atherosclerotic disease of the descending aorta with multiple areas consistent with penetrating ulcers.  These areas are stable and he does not have any symptoms referable to it.  We will plan to repeat a scan in 6 months to reevaluate those areas.  Multiple lung nodules-likely postinfectious or inflammatory.  Also need follow-up in 6 months.  Smoking cessation instruction/counseling given:  counseled patient on the dangers of tobacco use, advised patient to stop smoking, and reviewed strategies to maximize success   Plan: Return in 6 months with CT angiogram to reevaluate severe ulcerated atherosclerotic plaque in the descending aorta as well as multiple lung nodules.  [name], MD [facility] [phone]

## 2018-02-02 NOTE — Progress Notes (Signed)
02/02/18  Chief Complaint  Patient presents with  . Cysto    trus     HPI: 72 year old male with severe urinary symptoms including nocturia who presents today for evaluation via cystoscopy and TRUS volume.  See note from 12/2017 for details.   Please see previous notes for details.     Blood pressure 131/88, pulse (!) 102, height 6' (1.829 m), weight 244 lb (110.7 kg). NED. A&Ox3.   No respiratory distress   Abd soft, NT, ND Normal phallus with bilateral descended testicles    Cystoscopy Procedure Note  Patient identification was confirmed, informed consent was obtained, and patient was prepped using Betadine solution.  Lidocaine jelly was administered per urethral meatus.    Preoperative abx where received prior to procedure.     Pre-Procedure: - Inspection reveals a normal caliber ureteral meatus.  Procedure: The flexible cystoscope was introduced without difficulty - No urethral strictures/lesions are present. - Enlarged prostate with trilobar coaptation, 5 cm prostatic length, friable prostate mucosa - Elevated bladder neck - Bilateral ureteral orifices identified - Bladder mucosa  reveals no ulcers, tumors, or lesions - No bladder stones - Mid trabeculation  Retroflexion shows intravesical protrusion of the prostate without a discrete median lobe   Post-Procedure: - Patient tolerated the procedure well   Prostate transrectal ultrasound sizing   Informed consent was obtained after discussing risks/benefits of the procedure.  A time out was performed to ensure correct patient identity.   Pre-Procedure: -Transrectal probe was placed without difficulty -Transrectal Ultrasound performed revealing a 62 gm prostate measuring 4.62 x 4.82 x 5.34 cm (length) -No significant hypoechoic or median lobe noted      Assessment/ Plan:  1. Benign prostatic hyperplasia with urinary obstruction Patient does have prostamegaly which appears to be visually  obstructive  We discussed the option of surgical intervention versus pharmacotherapy to treat his obstructive component of his urinary symptoms.    At this point time, he is more interested in pharmacotherapy.  Previously tried Flomax without much benefit.  We will start him on finasteride, reviewed possible side effects of this medication as well as timing of efficacy  We will plan to return in 6 months to reevaluate, at that point may add additional medication for overactivity if he continues to have irritative voiding symptoms  - Urinalysis, Complete  2. Nocturia Continue to encourage sleep study, will discuss this with his primary care physician   Return in about 6 months (around 08/04/2018) for IPSS/ PVR.   Hollice Espy, MD

## 2018-02-11 ENCOUNTER — Other Ambulatory Visit: Payer: Self-pay

## 2018-02-11 MED ORDER — SIMVASTATIN 20 MG PO TABS: 20.0000 mg | ORAL_TABLET | Freq: Every day | ORAL | 1 refills | Status: DC

## 2018-02-12 ENCOUNTER — Ambulatory Visit (INDEPENDENT_AMBULATORY_CARE_PROVIDER_SITE_OTHER): Payer: Medicare Other | Admitting: Internal Medicine

## 2018-02-12 ENCOUNTER — Encounter: Payer: Self-pay | Admitting: Internal Medicine

## 2018-02-12 VITALS — BP 136/76 | HR 85 | Temp 98.1°F | Ht 72.0 in | Wt 244.0 lb

## 2018-02-12 DIAGNOSIS — J181 Lobar pneumonia, unspecified organism: Secondary | ICD-10-CM

## 2018-02-12 DIAGNOSIS — J189 Pneumonia, unspecified organism: Secondary | ICD-10-CM

## 2018-02-12 DIAGNOSIS — I700 Atherosclerosis of aorta: Secondary | ICD-10-CM | POA: Diagnosis not present

## 2018-02-12 DIAGNOSIS — R918 Other nonspecific abnormal finding of lung field: Secondary | ICD-10-CM

## 2018-02-12 MED ORDER — DOXYCYCLINE HYCLATE 100 MG PO TABS: 100.0000 mg | ORAL_TABLET | Freq: Two times a day (BID) | ORAL | 0 refills | Status: AC

## 2018-02-12 NOTE — Progress Notes (Signed)
Date:  02/12/2018   Name:  Mark Hooper   DOB:  May 22, 1945   MRN:  [id number]   Chief Complaint: Pneumonia (Seen Cardiologist the 19th of December- Dr Roxan Hockey. Had Chest Ct done which showed possible pneumonia is back. Did not prescribe anything for it.  Started 3 weeks ago. Off and on chills. Sound like having SOB.)  Cough  This is a new problem. The current episode started 1 to 4 weeks ago. The problem has been unchanged. The problem occurs hourly. The cough is productive of sputum. Associated symptoms include chills, shortness of breath and wheezing. Pertinent negatives include no ear pain, fever or myalgias. He has tried OTC cough suppressant for the symptoms. The treatment provided no relief.    Review of Systems  Constitutional: Positive for chills. Negative for fever.  HENT: Negative for ear pain.   Respiratory: Positive for cough, shortness of breath and wheezing.   Musculoskeletal: Negative for myalgias.   CT angio: 12/171/9 IMPRESSION: 1. Significant irregularity of the lumen of the entire thoracic and upper abdominal aorta consistent with severe diffuse atheromatous plaque formation, although ulcerated plaque can not be excluded on this exam. 2. Incidentally noted is an infrarenal abdominal aortic aneurysm of 3.9 x 4.0 cm. Recommend followup by ultrasound in 3 years. This recommendation follows ACR consensus guidelines: White Paper of the ACR Incidental Findings Committee II on Vascular Findings. J Am Coll Radiol 2013; 10:789-794. 3. The mid ascending thoracic aorta measures only 35 mm in diameter. No aneurysm of the thoracic aorta is seen. 4. Multiple small noncalcified lung nodules some of which are faint and possibly ground-glass. Recommend follow-up CT of the chest in 4-6 months to assess stability. These most likely are postinflammatory or post infectious in etiology. 5. Changes of chronic calcific pancreatitis.  Patient Active Problem List   Diagnosis  Date Noted  . Thoracic aortic atherosclerosis ([place]) 02/02/2018  . Abdominal aortic aneurysm (AAA) ([place]) 02/02/2018  . Aortic dilatation ([place]) 10/14/2017  . Benign prostatic hyperplasia with incomplete bladder emptying 02/05/2015  . Hx of colonic polyps   . Benign neoplasm of ascending colon   . Benign neoplasm of descending colon   . Benign neoplasm of sigmoid colon   . Tobacco use disorder 09/01/2014  . Basal cell carcinoma of nose 08/29/2014  . HLD (hyperlipidemia) 08/29/2014  . Plantar fasciitis 08/29/2014  . History of malignant melanoma of skin 09/08/2013  . Hx of abdominal abscess 07/29/2013    No Known Allergies  Past Surgical History:  Procedure Laterality Date  . basal cell cancer excision    . CATARACT EXTRACTION    . COLONOSCOPY    . COLONOSCOPY WITH PROPOFOL N/A 10/27/2014   Procedure: COLONOSCOPY WITH PROPOFOL;  Surgeon: Lucilla Lame, MD;  Location: [place];  Service: Endoscopy;  Laterality: N/A;  . POLYPECTOMY  10/27/2014   Procedure: POLYPECTOMY;  Surgeon: Lucilla Lame, MD;  Location: [place];  Service: Endoscopy;;    Social History   Tobacco Use  . Smoking status: Current Every Day Smoker    Packs/day: 1.00    Years: 50.00    Pack years: 50.00    Types: Cigarettes  . Smokeless tobacco: Never Used  Substance Use Topics  . Alcohol use: Yes    Alcohol/week: 5.0 standard drinks    Types: 1 Shots of liquor, 4 Standard drinks or equivalent per week    Comment: rare  . Drug use: Not on file     Medication list has  been reviewed and updated.  Current Meds  Medication Sig  . ibuprofen (ADVIL,MOTRIN) 200 MG tablet Take 200 mg by mouth as needed.  . simvastatin (ZOCOR) 20 MG tablet Take 1 tablet (20 mg total) by mouth daily.    PHQ 2/9 Scores 02/02/2018 10/26/2017 10/14/2017 02/06/2016  PHQ - 2 Score 0 0 0 0    Physical Exam Vitals signs and nursing note reviewed.  Constitutional:      General: He is not in acute distress.     Appearance: He is well-developed.  HENT:     Head: Normocephalic and atraumatic.  Neck:     Musculoskeletal: Normal range of motion and neck supple.  Cardiovascular:     Rate and Rhythm: Normal rate.  No extrasystoles are present.    Heart sounds: Normal heart sounds.  Pulmonary:     Effort: Pulmonary effort is normal. No respiratory distress.     Breath sounds: Examination of the right-lower field reveals decreased breath sounds. Decreased breath sounds present.  Musculoskeletal: Normal range of motion.  Skin:    General: Skin is warm and dry.     Findings: No rash.  Neurological:     Mental Status: He is alert and oriented to person, place, and time.  Psychiatric:        Behavior: Behavior normal.        Thought Content: Thought content normal.     BP 136/76 (BP Location: Right Arm, Patient Position: Sitting, Cuff Size: Large)   Pulse 85   Temp 98.1 F (36.7 C) (Oral)   Ht 6' (1.829 m)   Wt 244 lb (110.7 kg)   SpO2 94%   BMI 33.09 kg/m   Assessment and Plan: 1. Community acquired pneumonia of right lower lobe of lung ([place]) Continue claritin, begin Mucinex - doxycycline (VIBRA-TABS) 100 MG tablet; Take 1 tablet (100 mg total) by mouth 2 (two) times daily for 10 days.  Dispense: 20 tablet; Refill: 0  2. Pulmonary nodules/lesions, multiple Repeat CT in 4-6 months as already planned by [facility]   Partially dictated using Editor, commissioning. Any errors are unintentional.  Halina Maidens, MD [facility]  02/12/2018

## 2018-02-12 NOTE — Patient Instructions (Signed)
Take claritin 10 mg once a day  Begin Mucinex-DM twice a day as needed for cough

## 2018-02-18 ENCOUNTER — Other Ambulatory Visit: Payer: Self-pay | Admitting: Internal Medicine

## 2018-02-18 MED ORDER — SIMVASTATIN 20 MG PO TABS: 20.0000 mg | ORAL_TABLET | Freq: Every day | ORAL | 0 refills | Status: DC

## 2018-03-22 ENCOUNTER — Ambulatory Visit: Payer: Self-pay

## 2018-04-07 ENCOUNTER — Ambulatory Visit: Payer: Self-pay

## 2018-04-12 ENCOUNTER — Ambulatory Visit (INDEPENDENT_AMBULATORY_CARE_PROVIDER_SITE_OTHER): Payer: Medicare Other

## 2018-04-12 ENCOUNTER — Other Ambulatory Visit: Payer: Self-pay | Admitting: Internal Medicine

## 2018-04-12 VITALS — BP 120/80 | HR 68 | Temp 98.0°F | Resp 16 | Ht 72.0 in | Wt 247.2 lb

## 2018-04-12 DIAGNOSIS — Z0000 Encounter for general adult medical examination without abnormal findings: Secondary | ICD-10-CM

## 2018-04-12 DIAGNOSIS — F17200 Nicotine dependence, unspecified, uncomplicated: Secondary | ICD-10-CM

## 2018-04-12 MED ORDER — VARENICLINE TARTRATE 0.5 MG X 11 & 1 MG X 42 PO MISC: ORAL | 0 refills | Status: DC

## 2018-04-12 NOTE — Patient Instructions (Signed)
Mark Hooper , Thank you for taking time to come for your Medicare Wellness Visit. I appreciate your ongoing commitment to your health goals. Please review the following plan we discussed and let me know if I can assist you in the future.   Screening recommendations/referrals: Colonoscopy: done 10/27/14. Repeat in 2021. Recommended yearly ophthalmology/optometry visit for glaucoma screening and checkup Recommended yearly dental visit for hygiene and checkup  Vaccinations:  Influenza vaccine: done 10/26/17 Pneumococcal vaccine: done 02/05/15 Tdap vaccine: due - please contact us if you get a cut or scrape Shingles vaccine: Shingrix discussed. Please contact your pharmacy for coverage information.   Advanced directives: Please bring a copy of your health care power of attorney and living will to the office at your convenience.  Conditions/risks identified: If you wish to quit smoking, help is available. For free tobacco cessation program offerings call the [facility] at [phone] or Live Well Line at 9414815554. You may also visit [email or web address] or email [email or web address] for more information on other programs.   Next appointment: Please follow up in one year for your Medicare Annual Wellness visit.    Preventive Care 72 Years and Older, Male Preventive care refers to lifestyle choices and visits with your health care provider that can promote health and wellness. What does preventive care include?  A yearly physical exam. This is also called an annual well check.  Dental exams once or twice a year.  Routine eye exams. Ask your health care provider how often you should have your eyes checked.  Personal lifestyle choices, including:  Daily care of your teeth and gums.  Regular physical activity.  Eating a healthy diet.  Avoiding tobacco and drug use.  Limiting alcohol use.  Practicing safe sex.  Taking low doses of aspirin every  day.  Taking vitamin and mineral supplements as recommended by your health care provider. What happens during an annual well check? The services and screenings done by your health care provider during your annual well check will depend on your age, overall health, lifestyle risk factors, and family history of disease. Counseling  Your health care provider may ask you questions about your:  Alcohol use.  Tobacco use.  Drug use.  Emotional well-being.  Home and relationship well-being.  Sexual activity.  Eating habits.  History of falls.  Memory and ability to understand (cognition).  Work and work Statistician. Screening  You may have the following tests or measurements:  Height, weight, and BMI.  Blood pressure.  Lipid and cholesterol levels. These may be checked every 5 years, or more frequently if you are over 41 years old.  Skin check.  Lung cancer screening. You may have this screening every year starting at age 19 if you have a 30-pack-year history of smoking and currently smoke or have quit within the past 72 years.  Fecal occult blood test (FOBT) of the stool. You may have this test every year starting at age 26.  Flexible sigmoidoscopy or colonoscopy. You may have a sigmoidoscopy every 5 years or a colonoscopy every 10 years starting at age 72.  Prostate cancer screening. Recommendations will vary depending on your family history and other risks.  Hepatitis C blood test.  Hepatitis B blood test.  Sexually transmitted disease (STD) testing.  Diabetes screening. This is done by checking your blood sugar (glucose) after you have not eaten for a while (fasting). You may have this done every 1-3 years.  Abdominal aortic aneurysm (AAA) screening. You may  need this if you are a current or former smoker.  Osteoporosis. You may be screened starting at age 72 if you are at high risk. Talk with your health care provider about your test results, treatment options,  and if necessary, the need for more tests. Vaccines  Your health care provider may recommend certain vaccines, such as:  Influenza vaccine. This is recommended every year.  Tetanus, diphtheria, and acellular pertussis (Tdap, Td) vaccine. You may need a Td booster every 10 years.  Zoster vaccine. You may need this after age 6.  Pneumococcal 13-valent conjugate (PCV13) vaccine. One dose is recommended after age 91.  Pneumococcal polysaccharide (PPSV23) vaccine. One dose is recommended after age 82. Talk to your health care provider about which screenings and vaccines you need and how often you need them. This information is not intended to replace advice given to you by your health care provider. Make sure you discuss any questions you have with your health care provider. Document Released: 03/02/2015 Document Revised: 10/24/2015 Document Reviewed: 12/05/2014 Elsevier Interactive Patient Education  2017 [place] Prevention in the Home Falls can cause injuries. They can happen to people of all ages. There are many things you can do to make your home safe and to help prevent falls. What can I do on the outside of my home?  Regularly fix the edges of walkways and driveways and fix any cracks.  Remove anything that might make you trip as you walk through a door, such as a raised step or threshold.  Trim any bushes or trees on the path to your home.  Use bright outdoor lighting.  Clear any walking paths of anything that might make someone trip, such as rocks or tools.  Regularly check to see if handrails are loose or broken. Make sure that both sides of any steps have handrails.  Any raised decks and porches should have guardrails on the edges.  Have any leaves, snow, or ice cleared regularly.  Use sand or salt on walking paths during winter.  Clean up any spills in your garage right away. This includes oil or grease spills. What can I do in the bathroom?  Use night  lights.  Install grab bars by the toilet and in the tub and shower. Do not use towel bars as grab bars.  Use non-skid mats or decals in the tub or shower.  If you need to sit down in the shower, use a plastic, non-slip stool.  Keep the floor dry. Clean up any water that spills on the floor as soon as it happens.  Remove soap buildup in the tub or shower regularly.  Attach bath mats securely with double-sided non-slip rug tape.  Do not have throw rugs and other things on the floor that can make you trip. What can I do in the bedroom?  Use night lights.  Make sure that you have a light by your bed that is easy to reach.  Do not use any sheets or blankets that are too big for your bed. They should not hang down onto the floor.  Have a firm chair that has side arms. You can use this for support while you get dressed.  Do not have throw rugs and other things on the floor that can make you trip. What can I do in the kitchen?  Clean up any spills right away.  Avoid walking on wet floors.  Keep items that you use a lot in easy-to-reach places.  If  you need to reach something above you, use a strong step stool that has a grab bar.  Keep electrical cords out of the way.  Do not use floor polish or wax that makes floors slippery. If you must use wax, use non-skid floor wax.  Do not have throw rugs and other things on the floor that can make you trip. What can I do with my stairs?  Do not leave any items on the stairs.  Make sure that there are handrails on both sides of the stairs and use them. Fix handrails that are broken or loose. Make sure that handrails are as long as the stairways.  Check any carpeting to make sure that it is firmly attached to the stairs. Fix any carpet that is loose or worn.  Avoid having throw rugs at the top or bottom of the stairs. If you do have throw rugs, attach them to the floor with carpet tape.  Make sure that you have a light switch at the  top of the stairs and the bottom of the stairs. If you do not have them, ask someone to add them for you. What else can I do to help prevent falls?  Wear shoes that:  Do not have high heels.  Have rubber bottoms.  Are comfortable and fit you well.  Are closed at the toe. Do not wear sandals.  If you use a stepladder:  Make sure that it is fully opened. Do not climb a closed stepladder.  Make sure that both sides of the stepladder are locked into place.  Ask someone to hold it for you, if possible.  Clearly mark and make sure that you can see:  Any grab bars or handrails.  First and last steps.  Where the edge of each step is.  Use tools that help you move around (mobility aids) if they are needed. These include:  Canes.  Walkers.  Scooters.  Crutches.  Turn on the lights when you go into a dark area. Replace any light bulbs as soon as they burn out.  Set up your furniture so you have a clear path. Avoid moving your furniture around.  If any of your floors are uneven, fix them.  If there are any pets around you, be aware of where they are.  Review your medicines with your doctor. Some medicines can make you feel dizzy. This can increase your chance of falling. Ask your doctor what other things that you can do to help prevent falls. This information is not intended to replace advice given to you by your health care provider. Make sure you discuss any questions you have with your health care provider. Document Released: 11/30/2008 Document Revised: 07/12/2015 Document Reviewed: 03/10/2014 Elsevier Interactive Patient Education  2017 [employer].

## 2018-04-12 NOTE — Progress Notes (Signed)
Subjective:   Mark Hooper is a 73 y.o. male who presents for Medicare Annual/Subsequent preventive examination.  Review of Systems:   Cardiac Risk Factors include: advanced age (>43men, >83 women);dyslipidemia;male gender;obesity (BMI >30kg/m2);smoking/ tobacco exposure     Objective:    Vitals: BP 120/80 (BP Location: Left Arm, Patient Position: Sitting, Cuff Size: Normal)   Pulse 68   Temp 98 F (36.7 C) (Oral)   Resp 16   Ht 6' (1.829 m)   Wt 247 lb 3.2 oz (112.1 kg)   SpO2 96%   BMI 33.53 kg/m   Body mass index is 33.53 kg/m.  Advanced Directives 04/12/2018 02/06/2016 10/27/2014  Does Patient Have a Medical Advance Directive? Yes Yes Yes  Type of Paramedic of [place];Living will Living will [place];Living will  Copy of [place] in Chart? No - copy requested - -    Tobacco Social History   Tobacco Use  Smoking Status Current Every Day Smoker  . Packs/day: 0.75  . Years: 55.00  . Pack years: 41.25  . Types: Cigarettes  Smokeless Tobacco Never Used     Ready to quit: Not Answered Counseling given: Not Answered   Clinical Intake:  Pre-visit preparation completed: Yes  Pain : No/denies pain     BMI - recorded: 33.53 Nutritional Status: BMI > 30  Obese Nutritional Risks: None Diabetes: No  How often do you need to have someone help you when you read instructions, pamphlets, or other written materials from your doctor or pharmacy?: 1 - Never  Interpreter Needed?: No  Information entered by :: Clemetine Marker LPN  Past Medical History:  Diagnosis Date  . Abdominal aortic aneurysm (AAA) ([place]) 02/02/2018  . Aneurysm, aortic ([place])   . Basal cell carcinoma 2016  . CHF (congestive heart failure) ([place])   . Diverticulitis   . DVT (deep venous thrombosis) ([place])   . Hypercholesteremia   . Thoracic aortic atherosclerosis ([place]) 02/02/2018  . Wears dentures    partial upper   Past Surgical  History:  Procedure Laterality Date  . basal cell cancer excision    . CATARACT EXTRACTION    . COLONOSCOPY    . COLONOSCOPY WITH PROPOFOL N/A 10/27/2014   Procedure: COLONOSCOPY WITH PROPOFOL;  Surgeon: Lucilla Lame, MD;  Location: [facility];  Service: Endoscopy;  Laterality: N/A;  . POLYPECTOMY  10/27/2014   Procedure: POLYPECTOMY;  Surgeon: Lucilla Lame, MD;  Location: [facility];  Service: Endoscopy;;   Family History  Problem Relation Age of Onset  . Hyperlipidemia Father   . CAD Father   . Prostate cancer Neg Hx   . Bladder Cancer Neg Hx   . Kidney cancer Neg Hx    Social History   Socioeconomic History  . Marital status: Married    Spouse name: Not on file  . Number of children: 2  . Years of education: Not on file  . Highest education level: Associate degree: academic program  Occupational History  . Occupation: Armed forces operational officer  Social Needs  . Financial resource strain: Not hard at all  . Food insecurity:    Worry: Never true    Inability: Never true  . Transportation needs:    Medical: No    Non-medical: No  Tobacco Use  . Smoking status: Current Every Day Smoker    Packs/day: 0.75    Years: 55.00    Pack years: 41.25    Types: Cigarettes  . Smokeless  tobacco: Never Used  Substance and Sexual Activity  . Alcohol use: Yes    Alcohol/week: 5.0 standard drinks    Types: 1 Shots of liquor, 4 Standard drinks or equivalent per week    Comment: rare  . Drug use: Never  . Sexual activity: Not on file  Lifestyle  . Physical activity:    Days per week: 4 days    Minutes per session: 60 min  . Stress: Not at all  Relationships  . Social connections:    Talks on phone: More than three times a week    Gets together: Three times a week    Attends religious service: More than 4 times per year    Active member of club or organization: No    Attends meetings of clubs or organizations: Never    Relationship status: Married  Other Topics Concern  .  Not on file  Social History Narrative  . Not on file    Outpatient Encounter Medications as of 04/12/2018  Medication Sig  . ibuprofen (ADVIL,MOTRIN) 200 MG tablet Take 200 mg by mouth as needed.  . simvastatin (ZOCOR) 20 MG tablet Take 1 tablet (20 mg total) by mouth daily.   No facility-administered encounter medications on file as of 04/12/2018.     Activities of Daily Living In your present state of health, do you have any difficulty performing the following activities: 04/12/2018 10/14/2017  Hearing? N N  Comment declines hearing aids -  Vision? N N  Difficulty concentrating or making decisions? N N  Walking or climbing stairs? N N  Dressing or bathing? N N  Doing errands, shopping? N N  Preparing Food and eating ? N -  Using the Toilet? N -  In the past six months, have you accidently leaked urine? N -  Do you have problems with loss of bowel control? N -  Managing your Medications? N -  Managing your Finances? N -  Housekeeping or managing your Housekeeping? N -  Some recent data might be hidden    Patient Care Team: Glean Hess, MD as PCP - General (Internal Medicine) Jannet Mantis, MD (Dermatology) [name], MD as Consulting Physician (Cardiothoracic Surgery)   Assessment:   This is a routine wellness examination for Unknown.  Exercise Activities and Dietary recommendations Current Exercise Habits: Home exercise routine, Type of exercise: Other - see comments(golfs several times a week), Time (Minutes): 60, Frequency (Times/Week): 4, Weekly Exercise (Minutes/Week): 240, Intensity: Moderate, Exercise limited by: cardiac condition(s)  Goals    . Quit Smoking     Pt wants to try chantix again to help quit smoking. Currently using nicotine patches. Smoking cessation information given.     . Quit smoking / using tobacco     Did not do well with Chantix due to nausea; will try Nicotine patches    . Quit smoking / using tobacco     Tried stop  smoking but does not work.       Fall Risk Fall Risk  04/12/2018 02/02/2018 10/26/2017 10/14/2017 02/06/2016  Falls in the past year? 0 1 Yes Yes No  Comment - - - - -  Number falls in past yr: 0 - 1 1 -  Injury with Fall? 0 0 No No -  Risk for fall due to : - - History of fall(s) History of fall(s) -  Follow up Falls prevention discussed - Falls evaluation completed Falls evaluation completed -   FALL RISK PREVENTION PERTAINING  TO THE HOME:  Any stairs in or around the home? Yes  If so, do they handrails? Yes   Home free of loose throw rugs in walkways, pet beds, electrical cords, etc? Yes  Adequate lighting in your home to reduce risk of falls? Yes   ASSISTIVE DEVICES UTILIZED TO PREVENT FALLS:  Life alert? No  Use of a cane, walker or w/c? No  Grab bars in the bathroom? No  Shower chair or bench in shower? Yes  Elevated toilet seat or a handicapped toilet? No   DME ORDERS:  DME order needed?  No   TIMED UP AND GO:  Was the test performed? Yes .  Length of time to ambulate 10 feet: 5 sec.   GAIT:  Appearance of gait: Gait stead-fast and without the use of an assistive device.   Education: Fall risk prevention has been discussed.  Intervention(s) required? No    Depression Screen PHQ 2/9 Scores 04/12/2018 02/02/2018 10/26/2017 10/14/2017  PHQ - 2 Score 0 0 0 0    Cognitive Function     6CIT Screen 04/12/2018 02/06/2016  What Year? 0 points 0 points  What month? 0 points 0 points  What time? 0 points 0 points  Count back from 20 0 points 0 points  Months in reverse 0 points 0 points  Repeat phrase 0 points 0 points  Total Score 0 0    Immunization History  Administered Date(s) Administered  . Influenza, High Dose Seasonal PF 10/26/2017  . Influenza-Unspecified 12/19/2014  . Pneumococcal Conjugate-13 02/05/2015  . Pneumococcal Polysaccharide-23 01/05/2012    Qualifies for Shingles Vaccine? Yes . Due for Shingrix. Education has been provided regarding the  importance of this vaccine. Pt has been advised to call insurance company to determine out of pocket expense. Advised may also receive vaccine at local pharmacy or Health Dept. Verbalized acceptance and understanding.  Tdap: Although this vaccine is not a covered service during a Wellness Exam, does the patient still wish to receive this vaccine today?  No .  Education has been provided regarding the importance of this vaccine. Advised may receive this vaccine at local pharmacy or Health Dept. Aware to provide a copy of the vaccination record if obtained from local pharmacy or Health Dept. Verbalized acceptance and understanding.  Flu Vaccine: Up to date  Pneumococcal Vaccine: Up to date   Screening Tests Health Maintenance  Topic Date Due  . TETANUS/TDAP  10/27/2018 (Originally 08/03/1964)  . COLONOSCOPY  10/27/2019  . INFLUENZA VACCINE  Completed  . Hepatitis C Screening  Completed  . PNA vac Low Risk Adult  Completed   Cancer Screenings:  Colorectal Screening: Completed 10/27/14. Repeat every 5 years.    Lung Cancer Screening: (Low Dose CT Chest recommended if Age 64-80 years, 30 pack-year currently smoking OR have quit w/in 15years.) does qualify. 6 month chest CT scans done by cardiologist.   Additional Screening:  Hepatitis C Screening: does qualify; Completed 02/05/15  Vision Screening: Recommended annual ophthalmology exams for early detection of glaucoma and other disorders of the eye. Is the patient up to date with their annual eye exam?  Yes  Who is the provider or what is the name of the office in which the pt attends annual eye exams? [facility]  Dental Screening: Recommended annual dental exams for proper oral hygiene  Community Resource Referral:  CRR required this visit?  No       Plan:    I have personally reviewed and addressed  the Medicare Annual Wellness questionnaire and have noted the following in the patient's chart:  A. Medical and social  history B. Use of alcohol, tobacco or illicit drugs  C. Current medications and supplements D. Functional ability and status E.  Nutritional status F.  Physical activity G. Advance directives H. List of other physicians I.  Hospitalizations, surgeries, and ER visits in previous 12 months J.  [place] such as hearing and vision if needed, cognitive and depression L. Referrals and appointments   In addition, I have reviewed and discussed with patient certain preventive protocols, quality metrics, and best practice recommendations. A written personalized care plan for preventive services as well as general preventive health recommendations were provided to patient.   Signed,  Clemetine Marker, LPN Nurse Health Advisor   Nurse Notes: pt requested to start chantix again to aid in smoking cessation. rx sent in by Dr. Army Melia.

## 2018-04-28 ENCOUNTER — Telehealth: Payer: Self-pay | Admitting: Urology

## 2018-04-28 NOTE — Telephone Encounter (Signed)
Pt called to cancel June appt and will call back to reschedule.  Just F.Y.I.

## 2018-05-17 ENCOUNTER — Telehealth: Payer: Self-pay

## 2018-05-17 MED ORDER — VARENICLINE TARTRATE 1 MG PO TABS: 1.0000 mg | ORAL_TABLET | Freq: Two times a day (BID) | ORAL | 0 refills | Status: DC

## 2018-05-17 NOTE — Telephone Encounter (Signed)
Sent in chantex cont pack 1 mg 2 times daily.

## 2018-05-18 ENCOUNTER — Other Ambulatory Visit: Payer: Self-pay | Admitting: Thoracic Surgery (Cardiothoracic Vascular Surgery)

## 2018-05-18 DIAGNOSIS — R918 Other nonspecific abnormal finding of lung field: Secondary | ICD-10-CM

## 2018-05-18 DIAGNOSIS — I77819 Aortic ectasia, unspecified site: Secondary | ICD-10-CM

## 2018-05-18 DIAGNOSIS — R911 Solitary pulmonary nodule: Secondary | ICD-10-CM

## 2018-06-25 ENCOUNTER — Other Ambulatory Visit: Payer: Self-pay

## 2018-06-25 MED ORDER — VARENICLINE TARTRATE 1 MG PO TABS: 1.0000 mg | ORAL_TABLET | Freq: Two times a day (BID) | ORAL | 0 refills | Status: DC

## 2018-07-22 ENCOUNTER — Other Ambulatory Visit: Payer: Self-pay

## 2018-07-22 MED ORDER — SIMVASTATIN 20 MG PO TABS: 20.0000 mg | ORAL_TABLET | Freq: Every day | ORAL | 0 refills | Status: DC

## 2018-07-27 ENCOUNTER — Telehealth: Payer: Self-pay

## 2018-07-27 ENCOUNTER — Other Ambulatory Visit: Payer: Self-pay | Admitting: Internal Medicine

## 2018-07-27 MED ORDER — VARENICLINE TARTRATE 1 MG PO TABS: 1.0000 mg | ORAL_TABLET | Freq: Two times a day (BID) | ORAL | 5 refills | Status: DC

## 2018-07-27 NOTE — Telephone Encounter (Signed)
Patient called stating he needs renewal on Chantix.  Please Advise.   [place], [address] [place]

## 2018-08-04 ENCOUNTER — Ambulatory Visit: Payer: Self-pay | Admitting: Urology

## 2018-08-05 ENCOUNTER — Other Ambulatory Visit: Payer: Medicare Other

## 2018-08-10 ENCOUNTER — Ambulatory Visit: Payer: Medicare Other | Admitting: Thoracic Surgery (Cardiothoracic Vascular Surgery)

## 2018-08-24 ENCOUNTER — Other Ambulatory Visit: Payer: Self-pay | Admitting: Internal Medicine

## 2018-08-24 ENCOUNTER — Telehealth: Payer: Self-pay

## 2018-08-24 NOTE — Telephone Encounter (Signed)
Patient called to request RF on Chantix to [facility].  Please advise.

## 2018-08-24 NOTE — Telephone Encounter (Signed)
Patient wife informed

## 2018-08-24 NOTE — Telephone Encounter (Signed)
Chantix was refilled a month ago with 5 RFs.

## 2018-10-15 ENCOUNTER — Ambulatory Visit
Admission: RE | Admit: 2018-10-15 | Discharge: 2018-10-15 | Disposition: A | Discharge status: Home / Self Care | Payer: Medicare Other | Source: Ambulatory Visit | Attending: Thoracic Surgery (Cardiothoracic Vascular Surgery) | Admitting: Thoracic Surgery (Cardiothoracic Vascular Surgery)

## 2018-10-15 DIAGNOSIS — R911 Solitary pulmonary nodule: Secondary | ICD-10-CM

## 2018-10-15 MED ORDER — IOPAMIDOL (ISOVUE-370) INJECTION 76%
75.0000 mL | Freq: Once | INTRAVENOUS | Status: AC | PRN
  Administered 2018-10-15: 75 mL via INTRAVENOUS

## 2018-10-18 ENCOUNTER — Encounter

## 2018-10-19 ENCOUNTER — Encounter: Payer: Self-pay | Admitting: Thoracic Surgery (Cardiothoracic Vascular Surgery)

## 2018-10-19 ENCOUNTER — Other Ambulatory Visit: Payer: Self-pay

## 2018-10-19 ENCOUNTER — Ambulatory Visit (INDEPENDENT_AMBULATORY_CARE_PROVIDER_SITE_OTHER): Payer: Medicare Other | Admitting: Thoracic Surgery (Cardiothoracic Vascular Surgery)

## 2018-10-19 VITALS — BP 126/81 | HR 94 | Temp 97.7°F | Resp 16 | Ht 72.0 in | Wt 244.4 lb

## 2018-10-19 DIAGNOSIS — R918 Other nonspecific abnormal finding of lung field: Secondary | ICD-10-CM | POA: Diagnosis not present

## 2018-10-19 DIAGNOSIS — I700 Atherosclerosis of aorta: Secondary | ICD-10-CM | POA: Diagnosis not present

## 2018-10-19 NOTE — Progress Notes (Signed)
[address]       [place] [address]             [phone]     HPI: 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    He was first found to have aortic disease and August 2019.  He was waiting in line and airport in [place] and had a syncopal episode.  He was taken to the hospital.  A CT angiogram showed severe atherosclerotic disease involving the distal arch and descending thoracic aorta. I have been following him since that time.  He continues to smoke.  He recently started Chantix and is trying to quit.  He is down to about 10 cigarettes a day.  He says that his cravings are more severe in the morning.  He remains physically active and is playing golf 4 times a week.  He is not having any chest or back pain.  Past Medical History:  Diagnosis Date  . Abdominal aortic aneurysm (AAA) ([place]) 02/02/2018  . Aneurysm, aortic ([place])   . Basal cell carcinoma 2016  . CHF (congestive heart failure) ([facility])   . Diverticulitis   . DVT (deep venous thrombosis) ([place])   . Hypercholesteremia   . Thoracic aortic atherosclerosis ([place] [facility]) 02/02/2018  . Wears dentures    partial upper    Current Outpatient Medications  Medication Sig Dispense Refill  . ibuprofen (ADVIL,MOTRIN) 200 MG tablet Take 200 mg by mouth as needed.    . simvastatin (ZOCOR) 20 MG tablet Take 1 tablet (20 mg total) by mouth daily. 90 tablet 0  . varenicline (CHANTIX CONTINUING MONTH PAK) 1 MG tablet Take 1 tablet (1 mg total) by mouth 2 (two) times daily. 60 tablet 5   No current facility-administered medications for this visit.     Physical Exam BP 126/81 (BP Location: Left Arm, Patient Position: Sitting, Cuff Size: Normal)   Pulse 94   Temp  97.7 F (36.5 C)   Resp 16   Ht 6' (1.829 m)   Wt 244 lb 6.4 oz (110.9 kg)   SpO2 94% Comment: ON RA  BMI 33.71 kg/m  73 year old man in no acute distress Obese Alert and oriented x3 with no focal neurologic deficits No carotid bruits Cardiac regular rate and rhythm, normal S1 and S2 Lungs clear bilaterally  Diagnostic Tests: CT ANGIOGRAPHY CHEST WITH CONTRAST  TECHNIQUE: Multidetector CT imaging of the chest was performed using the standard protocol during bolus administration of intravenous contrast. Multiplanar CT image reconstructions and MIPs were obtained to evaluate the vascular anatomy.  CONTRAST:  86mL ISOVUE-370 IOPAMIDOL (ISOVUE-370) INJECTION 76%  COMPARISON:  CT scan of February 02, 2018.  FINDINGS: Cardiovascular: No dissection is noted. Severe atherosclerosis of thoracic aorta is noted. Great vessels are widely patent without significant stenosis. There appears to be a large penetrating atherosclerotic ulcer involving the anterior aspect of the distal descending thoracic aorta measuring 28 x 13 mm. Another smaller atherosclerotic ulcer is seen slightly more inferiorly. Ascending thoracic aorta has maximum diameter of 3.9 cm. Proximal descending thoracic aorta has a maximum measured diameter of 4.9 cm. There appears to be a large atherosclerotic ulcer along the left lateral aspect in this area which measures 4.3 x 3.5 cm. Normal cardiac size. No pericardial effusion is noted.  Mediastinum/Nodes: No enlarged mediastinal, hilar, or axillary lymph nodes. Thyroid gland, trachea, and esophagus demonstrate no significant findings.  Lungs/Pleura: No pneumothorax or pleural effusion is noted. Stable small ground-glass opacity is noted in right upper lobe best seen on image number 72 series 12. Measuring 6 mm. Stable scarring is seen in right middle lobe. Mild emphysematous disease is noted. 4 mm nodule is noted in left lower lobe best seen on image number  78 of series 7.  Upper Abdomen: No acute abnormality.  Musculoskeletal: No chest wall abnormality. No acute or significant osseous findings.  Review of the MIP images confirms the above findings.  IMPRESSION: Severe atherosclerosis of thoracic aorta is noted, with multiple large penetrating atherosclerotic ulcers, the largest measuring 4.3 x 3.5 cm involving the left lateral aspect of the distal transverse aortic arch and proximal descending thoracic aorta. Focal aneurysmal dilatation is noted in this area measuring 4.9 cm. Recommend semi-annual imaging followup by CTA or MRA and referral to cardiothoracic surgery if not already obtained. This recommendation follows 2010 ACCF/AHA/AATS/ACR/ASA/SCA/SCAI/SIR/STS/SVM Guidelines for the Diagnosis and Management of Patients With Thoracic Aortic Disease. Circulation. 2010; [id number]. Aortic aneurysm NOS (ICD10-I71.9).  Stable small focal ground-glass opacity is noted in right upper lobe. Attention on follow-up imaging is recommended.  Stable 4 mm nodule is noted in left lower lobe. Attention on follow-up imaging is recommended.  Aortic Atherosclerosis (ICD10-I70.0).   Electronically Signed   By: Marijo Conception M.D.   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  He had a syncopal spell about a year ago while waiting in line at the airport.  He has had no further issues with syncope.  He currently feels well.  Atherosclerotic cardiovascular disease/thoracic aortic atherosclerosis/penetrating atherosclerotic ulcer/descending thoracic  and aortic aneurysms-CT findings are stable.  He has severe atherosclerotic disease of the descending thoracic aorta.  There is no indication for surgery at the present time but he does need continued semiannual follow-up.  He also will need follow-up of his abdominal aneurysm as well.  Blood pressure is well controlled.  Not on any medications.  Tobacco abuse-he currently is trying to quit.  He has started taking Chantix.  He does still have some cravings, mostly in the mornings.  I encouraged him to consider using a nicotine replacement for those cravings.  As they are intermittent gum or lozenges might be better in his case than patches.  Plan: Return in 6 months with CT angiogram of chest.  [name], MD [facility] [phone]

## 2018-10-24 ENCOUNTER — Other Ambulatory Visit: Payer: Self-pay | Admitting: Internal Medicine

## 2018-11-01 ENCOUNTER — Encounter: Payer: Self-pay | Admitting: Internal Medicine

## 2018-12-10 ENCOUNTER — Other Ambulatory Visit: Payer: Self-pay

## 2018-12-10 ENCOUNTER — Encounter: Payer: Self-pay | Admitting: Internal Medicine

## 2018-12-10 ENCOUNTER — Ambulatory Visit (INDEPENDENT_AMBULATORY_CARE_PROVIDER_SITE_OTHER): Payer: Medicare Other | Admitting: Internal Medicine

## 2018-12-10 VITALS — BP 112/68 | HR 75 | Ht 72.0 in | Wt 244.0 lb

## 2018-12-10 DIAGNOSIS — M25562 Pain in left knee: Secondary | ICD-10-CM

## 2018-12-10 DIAGNOSIS — I700 Atherosclerosis of aorta: Secondary | ICD-10-CM

## 2018-12-10 DIAGNOSIS — N401 Enlarged prostate with lower urinary tract symptoms: Secondary | ICD-10-CM | POA: Diagnosis not present

## 2018-12-10 DIAGNOSIS — R3914 Feeling of incomplete bladder emptying: Secondary | ICD-10-CM | POA: Diagnosis not present

## 2018-12-10 DIAGNOSIS — F17200 Nicotine dependence, unspecified, uncomplicated: Secondary | ICD-10-CM | POA: Diagnosis not present

## 2018-12-10 DIAGNOSIS — Z23 Encounter for immunization: Secondary | ICD-10-CM

## 2018-12-10 DIAGNOSIS — E782 Mixed hyperlipidemia: Secondary | ICD-10-CM | POA: Diagnosis not present

## 2018-12-10 DIAGNOSIS — G8929 Other chronic pain: Secondary | ICD-10-CM

## 2018-12-10 LAB — POCT URINALYSIS DIPSTICK
Bilirubin, UA: NEGATIVE
Blood, UA: NEGATIVE
Glucose, UA: NEGATIVE
Ketones, UA: NEGATIVE
Leukocytes, UA: NEGATIVE
Nitrite, UA: NEGATIVE
Protein, UA: NEGATIVE
Spec Grav, UA: 1.015 (ref 1.010–1.025)
Urobilinogen, UA: 0.2 E.U./dL
pH, UA: 5 (ref 5.0–8.0)

## 2018-12-10 MED ORDER — SHINGRIX 50 MCG/0.5ML IM SUSR: 0.5000 mL | Freq: Once | INTRAMUSCULAR | 1 refills | Status: AC

## 2018-12-10 NOTE — Progress Notes (Signed)
Date:  12/10/2018   Name:  Mark Hooper   DOB:  08-Apr-1945   MRN:  [id number]   Chief Complaint: Hyperlipidemia and Nicotine Dependence Mark Hooper is a 73 y.o. male who presents today for his Complete Annual Exam. He feels fairly well. He reports exercising playing golf 4 times a week. He reports he is sleeping well.   Colonoscopy 2016 Immunizations UTD   Hyperlipidemia The problem is controlled. Pertinent negatives include no chest pain, myalgias or shortness of breath. Current antihyperlipidemic treatment includes statins. The current treatment provides significant improvement of lipids.  Knee Pain  The injury mechanism was a fall (in [employer]). The patient is experiencing no pain. Associated symptoms include a loss of motion. Pertinent negatives include no inability to bear weight. The symptoms are aggravated by movement.  Benign Prostatic Hypertrophy This is a chronic problem. Irritative symptoms include nocturia. Irritative symptoms do not include frequency. Pertinent negatives include no chills or dysuria. Nothing aggravates the symptoms. Past treatments include tamsulosin. The treatment provided no relief.  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  He feels well without chest or back pain.  He is on statin therapy.  He continues smoke. Tobacco use - he continues to smoke but is using Chantix to cut back, now about 1/2 ppd.  Review of Systems  Constitutional: Negative for appetite change, chills, diaphoresis, fatigue and unexpected weight change.  HENT: Negative for hearing loss, tinnitus, trouble swallowing and voice change.   Eyes: Negative for visual disturbance.  Respiratory: Negative for choking, shortness of breath and wheezing.   Cardiovascular: Negative for chest pain, palpitations and leg swelling.  Gastrointestinal: Negative for abdominal pain, blood in stool, constipation and diarrhea.  Genitourinary: Positive for nocturia. Negative for difficulty  urinating, dysuria and frequency.  Musculoskeletal: Negative for arthralgias (left knee slips out of joint), back pain and myalgias.  Skin: Negative for color change and rash.  Neurological: Negative for dizziness, syncope and headaches.  Hematological: Negative for adenopathy.  Psychiatric/Behavioral: Negative for dysphoric mood and sleep disturbance.    Patient Active Problem List   Diagnosis Date Noted  . Pulmonary nodules/lesions, multiple 02/12/2018  . Thoracic aortic atherosclerosis ([place]) 02/02/2018  . Abdominal aortic aneurysm (AAA) ([place]) 02/02/2018  . Aortic dilatation ([place]) 10/14/2017  . Benign prostatic hyperplasia with incomplete bladder emptying 02/05/2015  . Hx of colonic polyps   . Benign neoplasm of ascending colon   . Benign neoplasm of descending colon   . Benign neoplasm of sigmoid colon   . Tobacco use disorder 09/01/2014  . Basal cell carcinoma of nose 08/29/2014  . Mixed hyperlipidemia 08/29/2014  . Plantar fasciitis 08/29/2014  . History of malignant melanoma of skin 09/08/2013  . Hx of abdominal abscess 07/29/2013    No Known Allergies  Past Surgical History:  Procedure Laterality Date  . basal cell cancer excision    . CATARACT EXTRACTION    . COLONOSCOPY    . COLONOSCOPY WITH PROPOFOL N/A 10/27/2014   Procedure: COLONOSCOPY WITH PROPOFOL;  Surgeon: Lucilla Lame, MD;  Location: [facility];  Service: Endoscopy;  Laterality: N/A;  . POLYPECTOMY  10/27/2014   Procedure: POLYPECTOMY;  Surgeon: Lucilla Lame, MD;  Location: [facility];  Service: Endoscopy;;    Social History   Tobacco Use  . Smoking status: Current Every Day Smoker    Packs/day: 0.50    Years: 55.00    Pack years: 27.50    Types: Cigarettes  . Smokeless tobacco: Never  Used  . Tobacco comment: on chantix  Substance Use Topics  . Alcohol use: Yes    Alcohol/week: 5.0 standard drinks    Types: 1 Shots of liquor, 4 Standard drinks or equivalent per week    Comment: rare   . Drug use: Never     Medication list has been reviewed and updated.  Current Meds  Medication Sig  . ibuprofen (ADVIL,MOTRIN) 200 MG tablet Take 200 mg by mouth as needed.  . latanoprost (XALATAN) 0.005 % ophthalmic solution 1 drop at bedtime.  . simvastatin (ZOCOR) 20 MG tablet TAKE 1 TABLET DAILY  . varenicline (CHANTIX CONTINUING MONTH PAK) 1 MG tablet Take 1 tablet (1 mg total) by mouth 2 (two) times daily.    PHQ 2/9 Scores 12/10/2018 04/12/2018 02/02/2018 10/26/2017  PHQ - 2 Score 0 0 0 0    BP Readings from Last 3 Encounters:  12/10/18 112/68  10/19/18 126/81  04/12/18 120/80    Physical Exam Vitals signs and nursing note reviewed.  Constitutional:      Appearance: Normal appearance. He is well-developed.  HENT:     Head: Normocephalic.     Right Ear: Tympanic membrane, ear canal and external ear normal.     Left Ear: Tympanic membrane, ear canal and external ear normal.     Nose: Nose normal.     Mouth/Throat:     Pharynx: Uvula midline.  Eyes:     Conjunctiva/sclera: Conjunctivae normal.     Pupils: Pupils are equal, round, and reactive to light.  Neck:     Musculoskeletal: Normal range of motion and neck supple.     Thyroid: No thyromegaly.     Vascular: No carotid bruit.  Cardiovascular:     Rate and Rhythm: Normal rate and regular rhythm.     Heart sounds: Normal heart sounds.  Pulmonary:     Effort: Pulmonary effort is normal.     Breath sounds: Normal breath sounds. No wheezing.  Chest:     Breasts:        Right: No mass.        Left: No mass.  Abdominal:     General: Bowel sounds are normal.     Palpations: Abdomen is soft.     Tenderness: There is no abdominal tenderness.  Musculoskeletal: Normal range of motion.     Right knee: He exhibits normal range of motion, no swelling and no effusion. No tenderness found.     Left knee: He exhibits normal range of motion, no swelling and no effusion. No tenderness found.  Lymphadenopathy:      Cervical: No cervical adenopathy.  Skin:    General: Skin is warm and dry.  Neurological:     Mental Status: He is alert and oriented to person, place, and time.     Deep Tendon Reflexes: Reflexes are normal and symmetric.  Psychiatric:        Speech: Speech normal.        Behavior: Behavior normal.        Thought Content: Thought content normal.        Judgment: Judgment normal.     Wt Readings from Last 3 Encounters:  12/10/18 244 lb (110.7 kg)  10/19/18 244 lb 6.4 oz (110.9 kg)  04/12/18 247 lb 3.2 oz (112.1 kg)    BP 112/68   Pulse 75   Ht 6' (1.829 m)   Wt 244 lb (110.7 kg)   SpO2 94%   BMI 33.09 kg/m  Assessment and Plan: 1. Thoracic aortic atherosclerosis ([place]) With aneurysm being followed closely by CVTS Recent CT stable  2. Mixed hyperlipidemia On moderate intensity statin - consider change if LDL > 70 - Comprehensive metabolic panel - Lipid panel  3. Tobacco use disorder Using Chantix and gradually cutting back  4. Benign prostatic hyperplasia with incomplete bladder emptying Evaluated in the past by Urology - recommended surgery but he declined He is coping with nocturia without issue at this time - POCT urinalysis dipstick - PSA  5. Need for shingles vaccine - Zoster Vaccine Adjuvanted [facility]) injection; Inject 0.5 mLs into the muscle once for 1 dose.  Dispense: 0.5 mL; Refill: 1  6. Chronic pain of left knee - Ambulatory referral to Orthopedic Surgery   Partially dictated using Dragon software. Any errors are unintentional.  Halina Maidens, MD [facility]  12/10/2018

## 2018-12-11 LAB — COMPREHENSIVE METABOLIC PANEL
ALT: 17 IU/L (ref 0–44)
AST: 15 IU/L (ref 0–40)
Albumin/Globulin Ratio: 1.6 (ref 1.2–2.2)
Albumin: 4.2 g/dL (ref 3.7–4.7)
Alkaline Phosphatase: 73 IU/L (ref 39–117)
BUN/Creatinine Ratio: 15 (ref 10–24)
BUN: 16 mg/dL (ref 8–27)
Bilirubin Total: 0.2 mg/dL (ref 0.0–1.2)
CO2: 23 mmol/L (ref 20–29)
Calcium: 9.9 mg/dL (ref 8.6–10.2)
Chloride: 105 mmol/L (ref 96–106)
Creatinine, Ser: 1.08 mg/dL (ref 0.76–1.27)
GFR calc Af Amer: 79 mL/min/{1.73_m2} (ref >59)
GFR calc non Af Amer: 68 mL/min/{1.73_m2} (ref >59)
Globulin, Total: 2.6 g/dL (ref 1.5–4.5)
Glucose: 114 mg/dL — ABNORMAL HIGH (ref 65–99)
Potassium: 4.8 mmol/L (ref 3.5–5.2)
Sodium: 142 mmol/L (ref 134–144)
Total Protein: 6.8 g/dL (ref 6.0–8.5)

## 2018-12-11 LAB — LIPID PANEL
Chol/HDL Ratio: 3 ratio (ref 0.0–5.0)
Cholesterol, Total: 137 mg/dL (ref 100–199)
HDL: 46 mg/dL (ref >39)
LDL Chol Calc (NIH): 69 mg/dL (ref 0–99)
Triglycerides: 125 mg/dL (ref 0–149)
VLDL Cholesterol Cal: 22 mg/dL (ref 5–40)

## 2018-12-11 LAB — PSA: Prostate Specific Ag, Serum: 2 ng/mL (ref 0.0–4.0)

## 2018-12-29 ENCOUNTER — Other Ambulatory Visit: Payer: Self-pay | Admitting: Orthopedic Surgery

## 2018-12-29 DIAGNOSIS — M2352 Chronic instability of knee, left knee: Secondary | ICD-10-CM

## 2018-12-29 DIAGNOSIS — M2392 Unspecified internal derangement of left knee: Secondary | ICD-10-CM

## 2018-12-29 DIAGNOSIS — M25362 Other instability, left knee: Secondary | ICD-10-CM

## 2019-01-12 ENCOUNTER — Ambulatory Visit
Admission: RE | Admit: 2019-01-12 | Discharge: 2019-01-12 | Disposition: A | Discharge status: Home / Self Care | Payer: Medicare Other | Source: Ambulatory Visit | Attending: Orthopedic Surgery | Admitting: Orthopedic Surgery

## 2019-01-12 ENCOUNTER — Other Ambulatory Visit: Payer: Self-pay

## 2019-01-12 DIAGNOSIS — M2352 Chronic instability of knee, left knee: Secondary | ICD-10-CM

## 2019-01-12 DIAGNOSIS — M2392 Unspecified internal derangement of left knee: Secondary | ICD-10-CM | POA: Diagnosis present

## 2019-01-12 DIAGNOSIS — M25362 Other instability, left knee: Secondary | ICD-10-CM | POA: Diagnosis present

## 2019-01-13 ENCOUNTER — Other Ambulatory Visit: Payer: Self-pay | Admitting: Internal Medicine

## 2019-03-03 ENCOUNTER — Other Ambulatory Visit: Payer: Self-pay | Admitting: Thoracic Surgery (Cardiothoracic Vascular Surgery)

## 2019-03-03 DIAGNOSIS — I712 Thoracic aortic aneurysm, without rupture, unspecified: Secondary | ICD-10-CM

## 2019-04-18 ENCOUNTER — Other Ambulatory Visit: Payer: Self-pay

## 2019-04-18 ENCOUNTER — Ambulatory Visit (INDEPENDENT_AMBULATORY_CARE_PROVIDER_SITE_OTHER): Payer: Medicare Other

## 2019-04-18 VITALS — BP 104/72 | HR 77 | Temp 97.1°F | Resp 16 | Ht 72.0 in | Wt 238.2 lb

## 2019-04-18 DIAGNOSIS — Z0000 Encounter for general adult medical examination without abnormal findings: Secondary | ICD-10-CM | POA: Diagnosis not present

## 2019-04-18 DIAGNOSIS — Z1211 Encounter for screening for malignant neoplasm of colon: Secondary | ICD-10-CM | POA: Diagnosis not present

## 2019-04-18 NOTE — Progress Notes (Addendum)
Subjective:   Mark Hooper is a 75 y.o. male who presents for Medicare Annual/Subsequent preventive examination.  Review of Systems:   Cardiac Risk Factors include: advanced age (>76men, >49 women);dyslipidemia;male gender;smoking/ tobacco exposure      Objective:    Vitals: BP 104/72 (BP Location: Left Arm, Patient Position: Sitting, Cuff Size: Normal)   Pulse 77   Temp (!) 97.1 F (36.2 C) (Temporal)   Resp 16   Ht 6' (1.829 m)   Wt 238 lb 3.2 oz (108 kg)   SpO2 97%   BMI 32.31 kg/m   Body mass index is 32.31 kg/m.  Advanced Directives 04/18/2019 04/12/2018 02/06/2016 10/27/2014  Does Patient Have a Medical Advance Directive? No Yes Yes Yes  Type of Advance Directive - [place];Living will Living will [place];Living will  Copy of [place] in Chart? - No - copy requested - -  Would patient like information on creating a medical advance directive? Yes (MAU/Ambulatory/Procedural Areas - Information given) - - -    Tobacco Social History   Tobacco Use  Smoking Status Current Every Day Smoker  . Packs/day: 0.50  . Years: 55.00  . Pack years: 27.50  . Types: Cigarettes  Smokeless Tobacco Never Used  Tobacco Comment   on chantix     Ready to quit: Not Answered Counseling given: Not Answered Comment: on chantix   Clinical Intake:  Pre-visit preparation completed: Yes  Pain : No/denies pain     BMI - recorded: 32.31 Nutritional Status: BMI > 30  Obese Nutritional Risks: None Diabetes: No  How often do you need to have someone help you when you read instructions, pamphlets, or other written materials from your doctor or pharmacy?: 1 - Never  Interpreter Needed?: No  Information entered by :: Clemetine Marker LPN  Past Medical History:  Diagnosis Date  . Abdominal aortic aneurysm (AAA) ([place]) 02/02/2018  . Aneurysm, aortic ([place])   . Basal cell carcinoma 2016  . CHF (congestive heart failure) ([place])     . Diverticulitis   . DVT (deep venous thrombosis) ([place])   . Glaucoma   . Hypercholesteremia   . Thoracic aortic atherosclerosis ([place]) 02/02/2018  . Wears dentures    partial upper   Past Surgical History:  Procedure Laterality Date  . basal cell cancer excision    . CATARACT EXTRACTION    . COLONOSCOPY    . COLONOSCOPY WITH PROPOFOL N/A 10/27/2014   Procedure: COLONOSCOPY WITH PROPOFOL;  Surgeon: Lucilla Lame, MD;  Location: [facility];  Service: Endoscopy;  Laterality: N/A;  . POLYPECTOMY  10/27/2014   Procedure: POLYPECTOMY;  Surgeon: Lucilla Lame, MD;  Location: [place] [facility];  Service: Endoscopy;;   Family History  Problem Relation Age of Onset  . Hyperlipidemia Father   . CAD Father   . Prostate cancer Neg Hx   . Bladder Cancer Neg Hx   . Kidney cancer Neg Hx    Social History   Socioeconomic History  . Marital status: Married    Spouse name: Not on file  . Number of children: 2  . Years of education: Not on file  . Highest education level: Associate degree: academic program  Occupational History  . Occupation: Armed forces operational officer  Tobacco Use  . Smoking status: Current Every Day Smoker    Packs/day: 0.50    Years: 55.00    Pack years: 27.50    Types: Cigarettes  . Smokeless tobacco: Never Used  .  Tobacco comment: on chantix  Substance and Sexual Activity  . Alcohol use: Yes    Alcohol/week: 5.0 standard drinks    Types: 1 Shots of liquor, 4 Standard drinks or equivalent per week    Comment: rare  . Drug use: Never  . Sexual activity: Not on file  Other Topics Concern  . Not on file  Social History Narrative  . Not on file   Social Determinants of Health   Financial Resource Strain: Low Risk   . Difficulty of Paying Living Expenses: Not hard at all  Food Insecurity: No Food Insecurity  . Worried About Charity fundraiser in the Last Year: Never true  . Ran Out of Food in the Last Year: Never true  Transportation Needs: No Transportation Needs   . Lack of Transportation (Medical): No  . Lack of Transportation (Non-Medical): No  Physical Activity: Sufficiently Active  . Days of Exercise per Week: 3 days  . Minutes of Exercise per Session: 60 min  Stress: No Stress Concern Present  . Feeling of Stress : Not at all  Social Connections: Slightly Isolated  . Frequency of Communication with Friends and Family: More than three times a week  . Frequency of Social Gatherings with Friends and Family: Three times a week  . Attends Religious Services: More than 4 times per year  . Active Member of Clubs or Organizations: No  . Attends Archivist Meetings: Never  . Marital Status: Married    Outpatient Encounter Medications as of 04/18/2019  Medication Sig  . CHANTIX 1 MG tablet TAKE 1 TABLET BY MOUTH TWICE DAILY  . ibuprofen (ADVIL,MOTRIN) 200 MG tablet Take 200 mg by mouth as needed.  . latanoprost (XALATAN) 0.005 % ophthalmic solution 1 drop at bedtime.  . simvastatin (ZOCOR) 20 MG tablet TAKE 1 TABLET DAILY   No facility-administered encounter medications on file as of 04/18/2019.    Activities of Daily Living In your present state of health, do you have any difficulty performing the following activities: 04/18/2019  Hearing? N  Comment declines hearing aids  Vision? N  Difficulty concentrating or making decisions? N  Walking or climbing stairs? N  Dressing or bathing? N  Doing errands, shopping? N  Preparing Food and eating ? N  Using the Toilet? N  In the past six months, have you accidently leaked urine? N  Do you have problems with loss of bowel control? N  Managing your Medications? N  Managing your Finances? N  Housekeeping or managing your Housekeeping? N  Some recent data might be hidden    Patient Care Team: Glean Hess, MD as PCP - General (Internal Medicine) Jannet Mantis, MD (Dermatology) [name], MD as Consulting Physician (Cardiothoracic Surgery) Hollice Espy, MD as  Consulting Physician (Urology)   Assessment:   This is a routine wellness examination for Mark Hooper.  Exercise Activities and Dietary recommendations Current Exercise Habits: Home exercise routine, Type of exercise: Other - see comments(golf), Time (Minutes): 60, Frequency (Times/Week): 3, Weekly Exercise (Minutes/Week): 180, Intensity: Moderate, Exercise limited by: None identified  Goals    . Quit Smoking     Pt wants to try chantix again to help quit smoking. Currently using nicotine patches. Smoking cessation information given.   Pt has cut back to 1/2 pack per day as of 04/18/19.     . Quit smoking / using tobacco     Did not do well with Chantix due to nausea; will try Nicotine  patches       Fall Risk Fall Risk  04/18/2019 04/12/2018 02/02/2018 10/26/2017 10/14/2017  Falls in the past year? 0 0 1 Yes Yes  Comment - - - - -  Number falls in past yr: 0 0 - 1 1  Injury with Fall? 0 0 0 No No  Risk for fall due to : No Fall Risks - - History of fall(s) History of fall(s)  Follow up Falls prevention discussed Falls prevention discussed - Falls evaluation completed Falls evaluation completed   [place]:  Any stairs in or around the home? Yes  If so, do they handrails? Yes   Home free of loose throw rugs in walkways, pet beds, electrical cords, etc? Yes  Adequate lighting in your home to reduce risk of falls? Yes   ASSISTIVE DEVICES UTILIZED TO PREVENT FALLS:  Life alert? No  Use of a cane, walker or w/c? No  Grab bars in the bathroom? No  Shower chair or bench in shower? Yes  Elevated toilet seat or a handicapped toilet? No   DME ORDERS:  DME order needed?  No   TIMED UP AND GO:  Was the test performed? Yes .  Length of time to ambulate 10 feet: 5 sec.   GAIT:  Appearance of gait: Gait stead-fast and without the use of an assistive device.   Education: Fall risk prevention has been discussed.  Intervention(s) required? No   Depression  Screen PHQ 2/9 Scores 04/18/2019 12/10/2018 04/12/2018 02/02/2018  PHQ - 2 Score 0 0 0 0    Cognitive Function - 6CIT deferred for 2021 AWV, pt has no memory issues.      6CIT Screen 04/12/2018 02/06/2016  What Year? 0 points 0 points  What month? 0 points 0 points  What time? 0 points 0 points  Count back from 20 0 points 0 points  Months in reverse 0 points 0 points  Repeat phrase 0 points 0 points  Total Score 0 0    Immunization History  Administered Date(s) Administered  . Influenza, High Dose Seasonal PF 10/26/2017  . Influenza-Unspecified 12/19/2014, 12/03/2018  . PFIZER SARS-COV-2 Vaccination 03/29/2019  . Pneumococcal Conjugate-13 02/05/2015  . Pneumococcal Polysaccharide-23 01/05/2012    Qualifies for Shingles Vaccine? Yes  Shingrix series completed.   Tdap: Although this vaccine is not a covered service during a Wellness Exam, does the patient still wish to receive this vaccine today?  No .  Education has been provided regarding the importance of this vaccine. Advised may receive this vaccine at local pharmacy or Health Dept. Aware to provide a copy of the vaccination record if obtained from local pharmacy or Health Dept. Verbalized acceptance and understanding.  Flu Vaccine: Up to date  Pneumococcal Vaccine: Up to date   Screening Tests Health Maintenance  Topic Date Due  . TETANUS/TDAP  08/[birth year]/1965  . COLONOSCOPY  10/27/2019  . INFLUENZA VACCINE  Completed  . PNA vac Low Risk Adult  Completed  . Hepatitis C Screening  Addressed    Cancer Screenings:  Colorectal Screening: Completed 10/27/14. Repeat every 5 years. Referral to GI placed today. Pt aware the office will call re: appt.  Reason for Referral: screening colonoscopy Referral discussed with patient: yes Best contact number of patient for referral team: mobile Has patient been seen by a specialist for this issue before: yes . If so, who (practice/provider): Dr. Allen Norris . Does the patient wish to  return: yes Patient provider preference for  referral: Dr. Allen Norris Patient location preference for referral: [place]    Lung Cancer Screening: (Low Dose CT Chest recommended if Age 34-80 years, 30 pack-year currently smoking OR have quit w/in 15years.) does qualify. Chest CT with cardiology scheduled for 04/22/19.     Additional Screening:  Hepatitis C Screening: does qualify; Completed 02/05/15  Vision Screening: Recommended annual ophthalmology exams for early detection of glaucoma and other disorders of the eye. Is the patient up to date with their annual eye exam?  Yes  Who is the provider or what is the name of the office in which the pt attends annual eye exams? [facility]  Dental Screening: Recommended annual dental exams for proper oral hygiene  Community Resource Referral:  CRR required this visit?  No      Plan:    I have personally reviewed and addressed the Medicare Annual Wellness questionnaire and have noted the following in the patient's chart:  A. Medical and social history B. Use of alcohol, tobacco or illicit drugs  C. Current medications and supplements D. Functional ability and status E.  Nutritional status F.  Physical activity G. Advance directives H. List of other physicians I.  Hospitalizations, surgeries, and ER visits in previous 12 months J.  [place] such as hearing and vision if needed, cognitive and depression L. Referrals and appointments   In addition, I have reviewed and discussed with patient certain preventive protocols, quality metrics, and best practice recommendations. A written personalized care plan for preventive services as well as general preventive health recommendations were provided to patient.   Signed,  Clemetine Marker, LPN Nurse Health Advisor   Nurse Notes: pt doing well and appreciative of visit today. Scheduled for second covid vaccine tomorrow.

## 2019-04-18 NOTE — Patient Instructions (Signed)
Mark Hooper , Thank you for taking time to come for your Medicare Wellness Visit. I appreciate your ongoing commitment to your health goals. Please review the following plan we discussed and let me know if I can assist you in the future.   Screening recommendations/referrals: Colonoscopy: Done 10/27/14. Referral sent to gastroenterology today for repeat screening colonoscopy.  Recommended yearly ophthalmology/optometry visit for glaucoma screening and checkup Recommended yearly dental visit for hygiene and checkup  Vaccinations: Influenza vaccine: done 12/03/18 Pneumococcal vaccine: done 02/05/15 Tdap vaccine: due Shingles vaccine: completed    Covid-19: 1st dose 03/29/19  Advanced directives: Advance directive discussed with you today. I have provided a copy for you to complete at home and have notarized. Once this is complete please bring a copy in to our office so we can scan it into your chart.  Conditions/risks identified: If you wish to quit smoking, help is available. For free tobacco cessation program offerings call the [facility] at [phone] or Live Well Line at [phone]. You may also visit [email or web address] or email [email or web address] for more information on other programs.   Next appointment: Please follow up in one year for your Medicare Annual Wellness visit.    Preventive Care 73 Years and Older, Male Preventive care refers to lifestyle choices and visits with your health care provider that can promote health and wellness. What does preventive care include?  A yearly physical exam. This is also called an annual well check.  Dental exams once or twice a year.  Routine eye exams. Ask your health care provider how often you should have your eyes checked.  Personal lifestyle choices, including:  Daily care of your teeth and gums.  Regular physical activity.  Eating a healthy diet.  Avoiding tobacco and drug use.  Limiting alcohol  use.  Practicing safe sex.  Taking low doses of aspirin every day.  Taking vitamin and mineral supplements as recommended by your health care provider. What happens during an annual well check? The services and screenings done by your health care provider during your annual well check will depend on your age, overall health, lifestyle risk factors, and family history of disease. Counseling  Your health care provider may ask you questions about your:  Alcohol use.  Tobacco use.  Drug use.  Emotional well-being.  Home and relationship well-being.  Sexual activity.  Eating habits.  History of falls.  Memory and ability to understand (cognition).  Work and work Statistician. Screening  You may have the following tests or measurements:  Height, weight, and BMI.  Blood pressure.  Lipid and cholesterol levels. These may be checked every 5 years, or more frequently if you are over 10 years old.  Skin check.  Lung cancer screening. You may have this screening every year starting at age 23 if you have a 30-pack-year history of smoking and currently smoke or have quit within the past 15 years.  Fecal occult blood test (FOBT) of the stool. You may have this test every year starting at age 16.  Flexible sigmoidoscopy or colonoscopy. You may have a sigmoidoscopy every 5 years or a colonoscopy every 10 years starting at age 5.  Prostate cancer screening. Recommendations will vary depending on your family history and other risks.  Hepatitis C blood test.  Hepatitis B blood test.  Sexually transmitted disease (STD) testing.  Diabetes screening. This is done by checking your blood sugar (glucose) after you have not eaten for a while (fasting). You may have  this done every 1-3 years.  Abdominal aortic aneurysm (AAA) screening. You may need this if you are a current or former smoker.  Osteoporosis. You may be screened starting at age 21 if you are at high risk. Talk with your  health care provider about your test results, treatment options, and if necessary, the need for more tests. Vaccines  Your health care provider may recommend certain vaccines, such as:  Influenza vaccine. This is recommended every year.  Tetanus, diphtheria, and acellular pertussis (Tdap, Td) vaccine. You may need a Td booster every 10 years.  Zoster vaccine. You may need this after age 40.  Pneumococcal 13-valent conjugate (PCV13) vaccine. One dose is recommended after age 21.  Pneumococcal polysaccharide (PPSV23) vaccine. One dose is recommended after age 11. Talk to your health care provider about which screenings and vaccines you need and how often you need them. This information is not intended to replace advice given to you by your health care provider. Make sure you discuss any questions you have with your health care provider. Document Released: 03/02/2015 Document Revised: 10/24/2015 Document Reviewed: 12/05/2014 Elsevier Interactive Patient Education  2017 [place] Prevention in the Home Falls can cause injuries. They can happen to people of all ages. There are many things you can do to make your home safe and to help prevent falls. What can I do on the outside of my home?  Regularly fix the edges of walkways and driveways and fix any cracks.  Remove anything that might make you trip as you walk through a door, such as a raised step or threshold.  Trim any bushes or trees on the path to your home.  Use bright outdoor lighting.  Clear any walking paths of anything that might make someone trip, such as rocks or tools.  Regularly check to see if handrails are loose or broken. Make sure that both sides of any steps have handrails.  Any raised decks and porches should have guardrails on the edges.  Have any leaves, snow, or ice cleared regularly.  Use sand or salt on walking paths during winter.  Clean up any spills in your garage right away. This includes oil  or grease spills. What can I do in the bathroom?  Use night lights.  Install grab bars by the toilet and in the tub and shower. Do not use towel bars as grab bars.  Use non-skid mats or decals in the tub or shower.  If you need to sit down in the shower, use a plastic, non-slip stool.  Keep the floor dry. Clean up any water that spills on the floor as soon as it happens.  Remove soap buildup in the tub or shower regularly.  Attach bath mats securely with double-sided non-slip rug tape.  Do not have throw rugs and other things on the floor that can make you trip. What can I do in the bedroom?  Use night lights.  Make sure that you have a light by your bed that is easy to reach.  Do not use any sheets or blankets that are too big for your bed. They should not hang down onto the floor.  Have a firm chair that has side arms. You can use this for support while you get dressed.  Do not have throw rugs and other things on the floor that can make you trip. What can I do in the kitchen?  Clean up any spills right away.  Avoid walking on wet floors.  Keep items that you use a lot in easy-to-reach places.  If you need to reach something above you, use a strong step stool that has a grab bar.  Keep electrical cords out of the way.  Do not use floor polish or wax that makes floors slippery. If you must use wax, use non-skid floor wax.  Do not have throw rugs and other things on the floor that can make you trip. What can I do with my stairs?  Do not leave any items on the stairs.  Make sure that there are handrails on both sides of the stairs and use them. Fix handrails that are broken or loose. Make sure that handrails are as long as the stairways.  Check any carpeting to make sure that it is firmly attached to the stairs. Fix any carpet that is loose or worn.  Avoid having throw rugs at the top or bottom of the stairs. If you do have throw rugs, attach them to the floor with  carpet tape.  Make sure that you have a light switch at the top of the stairs and the bottom of the stairs. If you do not have them, ask someone to add them for you. What else can I do to help prevent falls?  Wear shoes that:  Do not have high heels.  Have rubber bottoms.  Are comfortable and fit you well.  Are closed at the toe. Do not wear sandals.  If you use a stepladder:  Make sure that it is fully opened. Do not climb a closed stepladder.  Make sure that both sides of the stepladder are locked into place.  Ask someone to hold it for you, if possible.  Clearly mark and make sure that you can see:  Any grab bars or handrails.  First and last steps.  Where the edge of each step is.  Use tools that help you move around (mobility aids) if they are needed. These include:  Canes.  Walkers.  Scooters.  Crutches.  Turn on the lights when you go into a dark area. Replace any light bulbs as soon as they burn out.  Set up your furniture so you have a clear path. Avoid moving your furniture around.  If any of your floors are uneven, fix them.  If there are any pets around you, be aware of where they are.  Review your medicines with your doctor. Some medicines can make you feel dizzy. This can increase your chance of falling. Ask your doctor what other things that you can do to help prevent falls. This information is not intended to replace advice given to you by your health care provider. Make sure you discuss any questions you have with your health care provider. Document Released: 11/30/2008 Document Revised: 07/12/2015 Document Reviewed: 03/10/2014 Elsevier Interactive Patient Education  2017 [employer].

## 2019-04-22 ENCOUNTER — Ambulatory Visit
Admission: RE | Admit: 2019-04-22 | Discharge: 2019-04-22 | Disposition: A | Discharge status: Home / Self Care | Payer: Medicare Other | Source: Ambulatory Visit | Attending: Thoracic Surgery (Cardiothoracic Vascular Surgery) | Admitting: Thoracic Surgery (Cardiothoracic Vascular Surgery)

## 2019-04-22 ENCOUNTER — Other Ambulatory Visit: Payer: Self-pay

## 2019-04-22 DIAGNOSIS — I712 Thoracic aortic aneurysm, without rupture, unspecified: Secondary | ICD-10-CM

## 2019-04-22 MED ORDER — IOPAMIDOL (ISOVUE-370) INJECTION 76%
75.0000 mL | Freq: Once | INTRAVENOUS | Status: AC | PRN
  Administered 2019-04-22: 75 mL via INTRAVENOUS

## 2019-04-26 ENCOUNTER — Ambulatory Visit: Payer: Medicare Other | Admitting: Thoracic Surgery (Cardiothoracic Vascular Surgery)

## 2019-05-02 ENCOUNTER — Encounter: Payer: Self-pay | Admitting: *Deleted

## 2019-05-03 ENCOUNTER — Encounter: Payer: Self-pay | Admitting: Thoracic Surgery (Cardiothoracic Vascular Surgery)

## 2019-05-03 ENCOUNTER — Other Ambulatory Visit: Payer: Self-pay

## 2019-05-03 ENCOUNTER — Ambulatory Visit (INDEPENDENT_AMBULATORY_CARE_PROVIDER_SITE_OTHER): Payer: Medicare Other | Admitting: Thoracic Surgery (Cardiothoracic Vascular Surgery)

## 2019-05-03 VITALS — BP 135/78 | HR 71 | Temp 97.7°F | Resp 20 | Ht 72.0 in | Wt 240.0 lb

## 2019-05-03 DIAGNOSIS — I700 Atherosclerosis of aorta: Secondary | ICD-10-CM | POA: Diagnosis not present

## 2019-05-03 DIAGNOSIS — I7123 Aneurysm of the descending thoracic aorta, without rupture: Secondary | ICD-10-CM

## 2019-05-03 DIAGNOSIS — I712 Thoracic aortic aneurysm, without rupture: Secondary | ICD-10-CM | POA: Diagnosis not present

## 2019-05-03 NOTE — Progress Notes (Signed)
[address]       [place] [address]             [phone]       HPI: Mr. Beel returns for follow-up of his descending thoracic aortic atherosclerosis and aneurysms  Mark Hooper is a 74 year old man with a history of tobacco abuse, hyperlipidemia, thoracic aortic atherosclerosis, penetrating atherosclerotic ulcers/aneurysms, and abdominal aortic aneurysm.  He was found to have aortic disease in August 2019.  He was waiting in line at [place] airport when he had a syncopal episode. A CT angiogram showed severe atherosclerotic disease involving the distal arch and descending thoracic aorta.  He has been followed since then.  He continues to struggle with tobacco cessation.  He is currently trying to quit again.  He is on Chantix.  He says the cravings are worse in the mornings and has been using some nicotine lozenges, which she thinks is helping.  He is down to about 6 cigarettes a day.  He denies any chest pain, pressure, or tightness.  He continues to play golf about 4 times a week.  Past Medical History:  Diagnosis Date  . Abdominal aortic aneurysm (AAA) ([place]) 02/02/2018  . Aneurysm, aortic ([place])   . Basal cell carcinoma 2016  . CHF (congestive heart failure) ([place])   . Diverticulitis   . DVT (deep venous thrombosis) ([place])   . Glaucoma   . Hypercholesteremia   . Thoracic aortic atherosclerosis ([facility]) 02/02/2018  . Wears dentures    partial upper    Current Outpatient Medications  Medication Sig Dispense Refill  . CHANTIX 1 MG tablet TAKE 1 TABLET BY MOUTH TWICE DAILY 56 tablet 5  . ibuprofen (ADVIL,MOTRIN) 200 MG tablet Take 200 mg by mouth as needed.    . latanoprost (XALATAN) 0.005 % ophthalmic solution 1 drop at bedtime.    . simvastatin (ZOCOR) 20 MG tablet TAKE 1 TABLET DAILY 90 tablet 3   No current facility-administered medications for this visit.    Physical Exam BP 135/78 (BP Location: Right Arm, Patient Position: Sitting, Cuff Size:  Large)   Pulse 71   Temp 97.7 F (36.5 C) (Temporal)   Resp 20   Ht 6' (1.829 m)   Wt 240 lb (108.9 kg)   SpO2 95% Comment: RA  BMI 32.55 kg/m  Obese 74 year old man in no acute distress Alert and oriented x3 with no focal deficits Cardiac regular rate and rhythm with a normal S1 and S2, no audible murmur Lungs clear with equal breath sounds bilaterally Abdomen no palpable mass No peripheral edema  Diagnostic Tests: CT ANGIOGRAPHY CHEST WITH CONTRAST  TECHNIQUE: Multidetector CT imaging of the chest was performed using the standard protocol during bolus administration of intravenous contrast. Multiplanar CT image reconstructions and MIPs were obtained to evaluate the vascular anatomy.  CONTRAST:  7mL ISOVUE-370 IOPAMIDOL (ISOVUE-370) INJECTION 76%  COMPARISON:  CT chest angiogram, 10/15/2018, 02/02/2018, CT abdomen pelvis, 09/21/2013  FINDINGS: Cardiovascular: Preferential opacification of the thoracic aorta. The aortic valve measures 2.7 cm in caliber. The sinuses of Valsalva are enlarged measuring up to 4.1 cm in caliber. The tubular ascending thoracic aorta measures up to 3.7 x 3.6 cm. There is a redemonstrated focal aneurysm of the distal aortic arch with a heavy burden of mural thrombus, measuring up to 5.2 x 4.4 cm in caliber (series 5, image 111), extending from the left subclavian origin through the isthmus. This is enlarged over time on prior examination dated  10/15/2018 at which time it measured 5.0 x 4.2 cm and 02/02/2018, at which time it measured 4.8 x 4.1 cm when measured similarly. There is severe mixed atherosclerosis and mural thrombus of the tortuous descending thoracic aorta, with a focal aneurysm measuring up to 3.9 x 3.7 cm, also slightly enlarged compared to prior examination dating 10/15/2018, at which time it measured 3.7 x 3.6 cm, and 02/02/2018, at which time it measured 3.4 x 3.4 cm. No significant change in a focal aneurysm at the left  aspect of the descending thoracic aorta just above the diaphragmatic hiatus measuring 4.2 x 3.1 cm (series 106, image 79). There is an incompletely imaged aneurysm of the infrarenal abdominal aorta measuring at least 4.3 x 4.2 cm, enlarged compared to prior examination dated 01/23/2018 and significantly enlarged compared to remote prior CT of the abdomen pelvis dated 09/21/2013. Normal heart size. Scattered coronary artery calcifications. No pericardial effusion.  Mediastinum/Nodes: No enlarged mediastinal, hilar, or axillary lymph nodes. Thyroid gland, trachea, and esophagus demonstrate no significant findings.  Lungs/Pleura: Mild centrilobular emphysema. Innumerable small irregular, predominantly centrilobular pulmonary nodules most numerous in the bilateral lung apices. Stable, benign 4 mm nodule of the superior segment left lower lobe (series 11, image 80). No pleural effusion or pneumothorax.  Upper Abdomen: No acute abnormality. Hepatic steatosis. Calcific stigmata of chronic pancreatitis.  Musculoskeletal: No chest wall abnormality. No acute or significant osseous findings.  Review of the MIP images confirms the above findings.  IMPRESSION: 1. Severe aortic atherosclerosis with multifocal aneurysm of the distal arch, descending thoracic aorta, and abdominal aorta, enlarging over time as detailed above. Greatest caliber of the imaged aorta is of the distal arch measuring up to 5.2 x 4.4 cm. Aortic Atherosclerosis (ICD10-I70.0). 2. Consider dedicated CT angiogram of the abdomen and pelvis to further evaluate enlarging abdominal aortic aneurysm. 3. Innumerable small irregular, predominantly centrilobular pulmonary nodules most numerous in the bilateral lung apices, favored to represent respiratory bronchiolitis in the setting of smoking. 4.  Emphysema (ICD10-J43.9). 5. Coronary artery disease. 6. Hepatic steatosis. 7. Calcific stigmata of chronic  pancreatitis.   Electronically Signed   By: Eddie Candle M.D.   On: 04/22/2019 11:38  I personally reviewed the CT images and concur with the findings noted above.  Impression: Mark Hooper is a 74 year old man with history of tobacco abuse, hyperlipidemia, thoracic aortic atherosclerosis, penetrating atherosclerotic ulcers/aneurysms, and abdominal aortic aneurysm.  He was first found to have thoracic aortic atherosclerotic disease in 2019 after a syncopal spell at [place] airport.  Thoracic aortic atherosclerosis-severe with multiple ulcerated plaques.  On Zocor.  Descending thoracic aneurysm-actually has multiple aneurysms some of which are likely ulcerated atherosclerotic plaques.  Largest measures about 5.2 cm.  I now see a dramatic change from his last scan but it may be a millimeter or 2 larger.  Needs continued semiannual follow-up.  Abdominal aortic aneurysm-unfortunately only the very top most portion of the abdominal aneurysm was seen but this area did appear enlarged compared to previous scans.  He needs a CT of the abdomen and pelvis to further evaluate that.  Tobacco abuse-I once again emphasized the critical importance of tobacco cessation for short and long-term health.  He is trying to quit and seems sincere in his efforts.  Hopefully he will be able to stop altogether.  Plan:  CT of abdomen and pelvis to evaluate infrarenal abdominal aortic aneurysm Quit smoking Return in 2 weeks to discuss results of CT abdomen  [name], MD [facility] [phone]

## 2019-05-04 ENCOUNTER — Other Ambulatory Visit: Payer: Self-pay | Admitting: *Deleted

## 2019-05-04 DIAGNOSIS — I714 Abdominal aortic aneurysm, without rupture, unspecified: Secondary | ICD-10-CM

## 2019-05-20 ENCOUNTER — Ambulatory Visit
Admission: RE | Admit: 2019-05-20 | Discharge: 2019-05-20 | Disposition: A | Discharge status: Home / Self Care | Payer: Medicare Other | Source: Ambulatory Visit | Attending: Thoracic Surgery (Cardiothoracic Vascular Surgery) | Admitting: Thoracic Surgery (Cardiothoracic Vascular Surgery)

## 2019-05-20 DIAGNOSIS — I714 Abdominal aortic aneurysm, without rupture, unspecified: Secondary | ICD-10-CM

## 2019-05-24 ENCOUNTER — Other Ambulatory Visit: Payer: Self-pay

## 2019-05-24 ENCOUNTER — Ambulatory Visit (INDEPENDENT_AMBULATORY_CARE_PROVIDER_SITE_OTHER): Payer: Medicare Other | Admitting: Thoracic Surgery (Cardiothoracic Vascular Surgery)

## 2019-05-24 VITALS — BP 123/78 | HR 83 | Temp 97.8°F | Resp 20 | Ht 72.0 in | Wt 237.0 lb

## 2019-05-24 DIAGNOSIS — I712 Thoracic aortic aneurysm, without rupture: Secondary | ICD-10-CM

## 2019-05-24 DIAGNOSIS — I700 Atherosclerosis of aorta: Secondary | ICD-10-CM | POA: Diagnosis not present

## 2019-05-24 DIAGNOSIS — I7123 Aneurysm of the descending thoracic aorta, without rupture: Secondary | ICD-10-CM

## 2019-05-24 NOTE — Progress Notes (Signed)
[address]       [place] [address]             [phone]       HPI: Mr. Mark Hooper returns to discuss the results of his abdominal CT  Mark Hooper is a 74 year old man with a history of tobacco abuse, hyperlipidemia, thoracic aortic atherosclerosis, penetrating atherosclerotic ulcers/aneurysms, and abdominal aneurysm.  He was first found to have aortic disease back in August 2019.  He had a syncopal spell while waiting in line at [facility] airport.  I saw him in the office a couple of weeks ago for follow-up of his ascending aneurysm.  On that scan there was imaging of the top portion of an abdominal aneurysm that was felt to be possibly enlarged.  We ordered an abdominal and pelvic CT and he now returns to discuss the results of that.  Past Medical History:  Diagnosis Date  . Abdominal aortic aneurysm (AAA) ([facility]) 02/02/2018  . Aneurysm, aortic ([facility])   . Basal cell carcinoma 2016  . CHF (congestive heart failure) ([facility])   . Diverticulitis   . DVT (deep venous thrombosis) ([facility])   . Glaucoma   . Hypercholesteremia   . Thoracic aortic atherosclerosis ([facility]) 02/02/2018  . Wears dentures    partial upper    Current Outpatient Medications  Medication Sig Dispense Refill  . CHANTIX 1 MG tablet TAKE 1 TABLET BY MOUTH TWICE DAILY 56 tablet 5  . ibuprofen (ADVIL,MOTRIN) 200 MG tablet Take 200 mg by mouth as needed.    . latanoprost (XALATAN) 0.005 % ophthalmic solution 1 drop at bedtime.    . simvastatin (ZOCOR) 20 MG tablet TAKE 1 TABLET DAILY 90 tablet 3   No current facility-administered medications for this visit.    Physical Exam BP 123/78   Pulse 83   Temp 97.8 F (36.6 C) (Skin)   Resp 20   Ht 6' (1.829 m)   Wt 237 lb (107.5 kg)   SpO2 94% Comment: RA  BMI 32.80 kg/m  74 year old man in no acute distress Alert and oriented x3 with no focal deficits  Diagnostic Tests: CT ABDOMEN AND PELVIS WITHOUT CONTRAST  TECHNIQUE: Multidetector CT  imaging of the abdomen and pelvis was performed following the standard protocol without IV contrast.  COMPARISON:  09/21/2013  FINDINGS: Lower chest: Emphysema. Normal heart size without pericardial or pleural effusion. Tiny hiatal hernia. Descending thoracic aortic dilatation which was detailed on dedicated CTA.  Hepatobiliary: Normal liver. Normal gallbladder, without biliary ductal dilatation.  Pancreas: Chronic calcific pancreatitis. No duct dilatation or acute inflammation.  Spleen: Normal in size, without focal abnormality.  Adrenals/Urinary Tract: Normal adrenal glands. No renal calculi or hydronephrosis. No hydroureter or ureteric calculi. No bladder calculi.  Stomach/Bowel: Normal stomach, without wall thickening.  Extensive colonic diverticulosis. The rectum is underdistended but appears asymmetrically thick walled, eccentric left on 80/2.  Sigmoid wall thickening with surrounding interstitial thickening, including on 71/2. This is at the site of a pericolonic abscess back in 2015.  Colonic stool burden suggests constipation. Normal terminal ileum and appendix. Normal small bowel.  Vascular/Lymphatic: Aortic atherosclerosis. Development of infrarenal abdominal aortic aneurysm, including at 4.4 x 4.2 cm on 35/2. The aorta then tapers down to 2.2 cm on 45/2, before dilating to 3.8 x 3.6 cm just above the bifurcation on 50/2.  New aneurysmal dilatation of the right common iliac artery at 3.0 cm, ectatic at 1.9 cm on the prior.  The left common  iliac is ectatic at 1.7 cm today, similar. Multiple abdominal retroperitoneal nodes, none pathologic by size criteria.  A node within the root of the sigmoid mesocolon measures 8 mm on 59/2 versus 6 mm on the prior. Smaller nodes are seen more inferiorly within the mesocolon and have enlarged since the prior.  No pelvic sidewall adenopathy.  Reproductive: Mild prostatomegaly.  Other: No significant  free fluid.  Musculoskeletal: No acute osseous abnormality.  IMPRESSION: 1. Development of bilobed infrarenal aortic dilatation, maximally 4.4 cm as detailed above. 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 Recommend followup by ultrasound in 1 year. This recommendation follows ACR consensus guidelines: White Paper of the ACR Incidental Findings Committee II on Vascular Findings. J Am Coll Radiol 2013; 10:789-794. Aortic aneurysm NOS (ICD10-I71.9) 2. Pericolonic inflammation surrounding the sigmoid, at the site of an abscess back in 2015. Although this could be chronic, recurrent acute diverticulitis cannot be excluded. Mild regional adenopathy is most likely reactive. 3. Apparent rectal wall thickening could be due to underdistention. Given these findings, as well as the extent of sigmoid wall thickening, consider colonoscopy if not recently performed. 4. Coronary artery atherosclerosis. Aortic Atherosclerosis (ICD10-I70.0). 5. Chronic calcific pancreatitis. 6.  Emphysema (ICD10-J43.9).   Electronically Signed   By: Abigail Miyamoto M.D.   On: 05/20/2019 11:37 I personally reviewed the CT images and concur with the findings noted above  Impression: Mark Hooper is a 74 year old man with a history of tobacco abuse, hyperlipidemia, thoracic and abdominal aortic atherosclerosis, penetrating atherosclerotic ulcers and multiple aneurysms.  Descending thoracic aortic aneurysm-CT last month showed relative stability with the largest area measuring 5.2 cm.  No indication for surgery presently but needs continued semiannual follow-up.  Infrarenal abdominal aortic aneurysm-4.4 cm.  No indication for surgery.  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  We will continue with semiannual follow-up as noted above.  Question diverticulitis-he  will follow-up with his general surgeon regarding those findings  Plan: Return in 6 months with CT angiogram of chest abdomen and pelvis  [name], MD [facility] [phone]

## 2019-05-30 ENCOUNTER — Other Ambulatory Visit: Payer: Self-pay

## 2019-05-30 ENCOUNTER — Ambulatory Visit (INDEPENDENT_AMBULATORY_CARE_PROVIDER_SITE_OTHER): Payer: Medicare Other | Admitting: Internal Medicine

## 2019-05-30 ENCOUNTER — Encounter: Payer: Self-pay | Admitting: Internal Medicine

## 2019-05-30 VITALS — BP 112/64 | HR 70 | Temp 97.5°F | Ht 72.0 in | Wt 236.0 lb

## 2019-05-30 DIAGNOSIS — K5732 Diverticulitis of large intestine without perforation or abscess without bleeding: Secondary | ICD-10-CM

## 2019-05-30 DIAGNOSIS — K5901 Slow transit constipation: Secondary | ICD-10-CM | POA: Diagnosis not present

## 2019-05-30 MED ORDER — AMOXICILLIN-POT CLAVULANATE 875-125 MG PO TABS: 1.0000 | ORAL_TABLET | Freq: Two times a day (BID) | ORAL | 0 refills | Status: AC

## 2019-05-30 NOTE — Patient Instructions (Signed)
Miralax powder - generic is fine.  Start with one capful in 8 oz fluid and drink daily.  Can increase or decrease the dose to get 1-3 stools per day.

## 2019-05-30 NOTE — Progress Notes (Signed)
Date:  05/30/2019   Name:  Mark Hooper   DOB:  [birth year]   MRN:  [id number]   Chief Complaint: Abdominal Pain (Diverticulitis pains. Says he has had this before. Consitpated. No bleeding. X 12 days.)  Abdominal Pain This is a new problem. The current episode started 1 to 4 weeks ago. The problem occurs daily. The problem has been gradually worsening. The pain is located in the LLQ. The pain is mild. The quality of the pain is colicky, cramping and a sensation of fullness. Associated symptoms include constipation. Pertinent negatives include no diarrhea, fever, headaches, nausea or vomiting.  He had a CT last week which suggested diverticulitis and constipation. He tried fiber and fluids but nothing else for constipation. CT abd: 05/20/2019 2. Pericolonic inflammation surrounding the sigmoid, at the site of an abscess back in 2015. Although this could be chronic, recurrent acute diverticulitis cannot be excluded. Mild regional adenopathy is most likely reactive Lab Results  Component Value Date   CREATININE 1.08 12/10/2018   BUN 16 12/10/2018   NA 142 12/10/2018   K 4.8 12/10/2018   CL 105 12/10/2018   CO2 23 12/10/2018   Lab Results  Component Value Date   CHOL 137 12/10/2018   HDL 46 12/10/2018   LDLCALC 69 12/10/2018   TRIG 125 12/10/2018   CHOLHDL 3.0 12/10/2018   Lab Results  Component Value Date   TSH 2.20 08/02/2013   No results found for: HGBA1C Lab Results  Component Value Date   WBC 11.4 (H) 10/26/2017   HGB 17.0 10/26/2017   HCT 49.6 10/26/2017   MCV 93 10/26/2017   PLT 527 (H) 10/26/2017   Lab Results  Component Value Date   ALT 17 12/10/2018   AST 15 12/10/2018   ALKPHOS 73 12/10/2018   BILITOT 0.2 12/10/2018     Review of Systems  Constitutional: Negative for chills (had chills the first few days), fatigue and fever.  Respiratory: Negative for chest tightness and shortness of breath.   Cardiovascular: Negative for chest pain.    Gastrointestinal: Positive for abdominal pain and constipation. Negative for anal bleeding, diarrhea, nausea and vomiting.  Neurological: Negative for dizziness and headaches.    Patient Active Problem List   Diagnosis Date Noted  . Pulmonary nodules/lesions, multiple 02/12/2018  . Thoracic aortic atherosclerosis ([place]) 02/02/2018  . Abdominal aortic aneurysm (AAA) ([place]) 02/02/2018  . Aortic dilatation ([place]) 10/14/2017  . Benign prostatic hyperplasia with incomplete bladder emptying 02/05/2015  . Hx of colonic polyps   . Benign neoplasm of ascending colon   . Benign neoplasm of descending colon   . Benign neoplasm of sigmoid colon   . Tobacco use disorder 09/01/2014  . Basal cell carcinoma of nose 08/29/2014  . Mixed hyperlipidemia 08/29/2014  . Plantar fasciitis 08/29/2014  . History of malignant melanoma of skin 09/08/2013  . Hx of abdominal abscess 07/29/2013    No Known Allergies  Past Surgical History:  Procedure Laterality Date  . basal cell cancer excision    . CATARACT EXTRACTION    . COLONOSCOPY    . COLONOSCOPY WITH PROPOFOL N/A 10/27/2014   Procedure: COLONOSCOPY WITH PROPOFOL;  Surgeon: Lucilla Lame, MD;  Location: [facility];  Service: Endoscopy;  Laterality: N/A;  . POLYPECTOMY  10/27/2014   Procedure: POLYPECTOMY;  Surgeon: Lucilla Lame, MD;  Location: [facility];  Service: Endoscopy;;    Social History   Tobacco Use  . Smoking status: Current Every Day Smoker  Packs/day: 0.50    Years: 55.00    Pack years: 27.50    Types: Cigarettes  . Smokeless tobacco: Never Used  . Tobacco comment: on chantix  Substance Use Topics  . Alcohol use: Yes    Alcohol/week: 5.0 standard drinks    Types: 1 Shots of liquor, 4 Standard drinks or equivalent per week    Comment: rare  . Drug use: Never     Medication list has been reviewed and updated.  Current Meds  Medication Sig  . CHANTIX 1 MG tablet TAKE 1 TABLET BY MOUTH TWICE DAILY  . ibuprofen  (ADVIL,MOTRIN) 200 MG tablet Take 200 mg by mouth as needed.  . latanoprost (XALATAN) 0.005 % ophthalmic solution 1 drop at bedtime.  . simvastatin (ZOCOR) 20 MG tablet TAKE 1 TABLET DAILY    PHQ 2/9 Scores 05/30/2019 04/18/2019 12/10/2018 04/12/2018  PHQ - 2 Score 0 0 0 0  PHQ- 9 Score 0 - - -    BP Readings from Last 3 Encounters:  05/30/19 112/64  05/24/19 123/78  05/03/19 135/78    Physical Exam Vitals and nursing note reviewed.  Constitutional:      General: He is not in acute distress.    Appearance: He is well-developed.  HENT:     Head: Normocephalic and atraumatic.  Pulmonary:     Effort: Pulmonary effort is normal. No respiratory distress.  Abdominal:     General: Bowel sounds are decreased.     Palpations: Abdomen is soft. There is no hepatomegaly or splenomegaly.     Tenderness: There is abdominal tenderness in the left lower quadrant. There is no right CVA tenderness, left CVA tenderness, guarding or rebound.  Musculoskeletal:        General: Normal range of motion.  Skin:    General: Skin is warm and dry.     Findings: No rash.  Neurological:     Mental Status: He is alert and oriented to person, place, and time.  Psychiatric:        Behavior: Behavior normal.        Thought Content: Thought content normal.     Wt Readings from Last 3 Encounters:  05/30/19 236 lb (107 kg)  05/24/19 237 lb (107.5 kg)  05/03/19 240 lb (108.9 kg)    BP 112/64   Pulse 70   Temp (!) 97.5 F (36.4 C) (Temporal)   Ht 6' (1.829 m)   Wt 236 lb (107 kg)   SpO2 96%   BMI 32.01 kg/m   Assessment and Plan: 1. Diverticulitis of large intestine without perforation or abscess without bleeding - amoxicillin-clavulanate (AUGMENTIN) 875-125 MG tablet; Take 1 tablet by mouth 2 (two) times daily for 10 days.  Dispense: 20 tablet; Refill: 0  2. Slow transit constipation Begin Miralax powder and titrate to effect. Continue adequate fluid intake   Partially dictated using Radio producer. Any errors are unintentional.  Halina Maidens, MD [facility]  05/30/2019

## 2019-08-22 ENCOUNTER — Other Ambulatory Visit: Payer: Self-pay | Admitting: Internal Medicine

## 2019-10-16 ENCOUNTER — Other Ambulatory Visit: Payer: Self-pay | Admitting: Internal Medicine

## 2019-10-26 ENCOUNTER — Other Ambulatory Visit: Payer: Self-pay

## 2019-10-26 ENCOUNTER — Telehealth (INDEPENDENT_AMBULATORY_CARE_PROVIDER_SITE_OTHER): Payer: Self-pay | Admitting: Gastroenterology

## 2019-10-26 DIAGNOSIS — Z86010 Personal history of colonic polyps: Secondary | ICD-10-CM

## 2019-10-26 MED ORDER — NA SULFATE-K SULFATE-MG SULF 17.5-3.13-1.6 GM/177ML PO SOLN: 1.0000 | Freq: Once | ORAL | 0 refills | Status: AC

## 2019-10-26 NOTE — Progress Notes (Signed)
Gastroenterology Pre-Procedure Review  Request Date: Friday 11/25/19 Requesting Physician: Dr. Allen Norris  PATIENT REVIEW QUESTIONS: The patient responded to the following health history questions as indicated:    1. Are you having any GI issues? no 2. Do you have a personal history of Polyps? yes (yes 10/27/14 colonoscopy report noted a poly performed by Dr. Lucilla Lame) 3. Do you have a family history of Colon Cancer or Polyps? no 4. Diabetes Mellitus? no 5. Joint replacements in the past 12 months?no 6. Major health problems in the past 3 months?no 7. Any artificial heart valves, MVP, or defibrillator?no    MEDICATIONS & ALLERGIES:    Patient reports the following regarding taking any anticoagulation/antiplatelet therapy:   Plavix, Coumadin, Eliquis, Xarelto, Lovenox, Pradaxa, Brilinta, or Effient? no Aspirin? no  Patient confirms/reports the following medications:  Current Outpatient Medications  Medication Sig Dispense Refill  . ibuprofen (ADVIL,MOTRIN) 200 MG tablet Take 200 mg by mouth as needed.    . latanoprost (XALATAN) 0.005 % ophthalmic solution 1 drop at bedtime.    . simvastatin (ZOCOR) 20 MG tablet TAKE 1 TABLET DAILY 90 tablet 3  . CHANTIX 1 MG tablet TAKE 1 TABLET BY MOUTH TWICE DAILY( EVERY TWELVE HOURS) (Patient not taking: Reported on 10/26/2019) 56 tablet 5   No current facility-administered medications for this visit.    Patient confirms/reports the following allergies:  No Known Allergies  No orders of the defined types were placed in this encounter.   AUTHORIZATION INFORMATION Primary Insurance: 1D#: Group #:  Secondary Insurance: 1D#: Group #:  SCHEDULE INFORMATION: Date: Friday 11/25/19 Time: Location:[facility]

## 2019-11-04 ENCOUNTER — Other Ambulatory Visit: Payer: Self-pay | Admitting: *Deleted

## 2019-11-04 DIAGNOSIS — I714 Abdominal aortic aneurysm, without rupture, unspecified: Secondary | ICD-10-CM

## 2019-11-04 DIAGNOSIS — I712 Thoracic aortic aneurysm, without rupture, unspecified: Secondary | ICD-10-CM

## 2019-11-17 NOTE — Discharge Instructions (Signed)
General Anesthesia, Adult, Care After This sheet gives you information about how to care for yourself after your procedure. Your health care provider may also give you more specific instructions. If you have problems or questions, contact your health care provider. What can I expect after the procedure? After the procedure, the following side effects are common:  Pain or discomfort at the IV site.  Nausea.  Vomiting.  Sore throat.  Trouble concentrating.  Feeling cold or chills.  Weak or tired.  Sleepiness and fatigue.  Soreness and body aches. These side effects can affect parts of the body that were not involved in surgery. Follow these instructions at home:  For at least 24 hours after the procedure:  Have a responsible adult stay with you. It is important to have someone help care for you until you are awake and alert.  Rest as needed.  Do not: ? Participate in activities in which you could fall or become injured. ? Drive. ? Use heavy machinery. ? Drink alcohol. ? Take sleeping pills or medicines that cause drowsiness. ? Make important decisions or sign legal documents. ? Take care of children on your own. Eating and drinking  Follow any instructions from your health care provider about eating or drinking restrictions.  When you feel hungry, start by eating small amounts of foods that are soft and easy to digest (bland), such as toast. Gradually return to your regular diet.  Drink enough fluid to keep your urine pale yellow.  If you vomit, rehydrate by drinking water, juice, or clear broth. General instructions  If you have sleep apnea, surgery and certain medicines can increase your risk for breathing problems. Follow instructions from your health care provider about wearing your sleep device: ? Anytime you are sleeping, including during daytime naps. ? While taking prescription pain medicines, sleeping medicines, or medicines that make you drowsy.  Return to  your normal activities as told by your health care provider. Ask your health care provider what activities are safe for you.  Take over-the-counter and prescription medicines only as told by your health care provider.  If you smoke, do not smoke without supervision.  Keep all follow-up visits as told by your health care provider. This is important. Contact a health care provider if:  You have nausea or vomiting that does not get better with medicine.  You cannot eat or drink without vomiting.  You have pain that does not get better with medicine.  You are unable to pass urine.  You develop a skin rash.  You have a fever.  You have redness around your IV site that gets worse. Get help right away if:  You have difficulty breathing.  You have chest pain.  You have blood in your urine or stool, or you vomit blood. Summary  After the procedure, it is common to have a sore throat or nausea. It is also common to feel tired.  Have a responsible adult stay with you for the first 24 hours after general anesthesia. It is important to have someone help care for you until you are awake and alert.  When you feel hungry, start by eating small amounts of foods that are soft and easy to digest (bland), such as toast. Gradually return to your regular diet.  Drink enough fluid to keep your urine pale yellow.  Return to your normal activities as told by your health care provider. Ask your health care provider what activities are safe for you. This information is not   intended to replace advice given to you by your health care provider. Make sure you discuss any questions you have with your health care provider. Document Revised: 02/06/2017 Document Reviewed: 09/19/2016 Elsevier Patient Education  2020 Elsevier Inc.  

## 2019-11-18 ENCOUNTER — Telehealth: Payer: Self-pay | Admitting: Internal Medicine

## 2019-11-18 NOTE — Telephone Encounter (Signed)
Patient is calling to request an antibody test. Patient has never had COVID. Patient states a lot places are recommending to have your antibodies checked. And is fully vaccinated. Please advise CB- [phone]

## 2019-11-18 NOTE — Telephone Encounter (Signed)
Contacted pt, they will contact [facility]

## 2019-11-21 ENCOUNTER — Encounter: Payer: Self-pay | Admitting: Gastroenterology

## 2019-11-21 ENCOUNTER — Other Ambulatory Visit: Payer: Self-pay

## 2019-11-23 ENCOUNTER — Other Ambulatory Visit
Admission: RE | Admit: 2019-11-23 | Discharge: 2019-11-23 | Disposition: A | Discharge status: Home / Self Care | Payer: Medicare Other | Source: Ambulatory Visit | Attending: Gastroenterology | Admitting: Gastroenterology

## 2019-11-23 ENCOUNTER — Other Ambulatory Visit: Payer: Self-pay

## 2019-11-23 DIAGNOSIS — Z01812 Encounter for preprocedural laboratory examination: Secondary | ICD-10-CM | POA: Insufficient documentation

## 2019-11-23 DIAGNOSIS — Z20822 Contact with and (suspected) exposure to covid-19: Secondary | ICD-10-CM | POA: Insufficient documentation

## 2019-11-23 LAB — SARS CORONAVIRUS 2 (TAT 6-24 HRS): SARS Coronavirus 2: NEGATIVE

## 2019-11-23 NOTE — Anesthesia Preprocedure Evaluation (Addendum)
Anesthesia Evaluation  Patient identified by MRN, date of birth, ID band Patient awake    History of Anesthesia Complications Negative for: history of anesthetic complications  Airway Mallampati: III  TM Distance: >3 FB Neck ROM: Full    Dental no notable dental hx.    Pulmonary Current Smoker (1/2 ppd) and Patient abstained from smoking.,    Pulmonary exam normal        Cardiovascular + DVT  Normal cardiovascular exam  Thoracic aortic aneurysm, stable at 5.2 cm in imaging from 05/2019, see note below   Neuro/Psych negative neurological ROS     GI/Hepatic negative GI ROS, Neg liver ROS,   Endo/Other  Obesity (BMI 31)  Renal/GU negative Renal ROS     Musculoskeletal   Abdominal   Peds  Hematology negative hematology ROS (+)   Anesthesia Other Findings Cardiothoracic note 05/24/19:  Impression: Mark Hooper is a 74 year old man with a history of tobacco abuse, hyperlipidemia, thoracic and abdominal aortic atherosclerosis, penetrating atherosclerotic ulcers and multiple aneurysms.  Descending thoracic aortic aneurysm-CT last month showed relative stability with the largest area measuring 5.2 cm.  No indication for surgery presently but needs continued semiannual follow-up.  Infrarenal abdominal aortic aneurysm-4.4 cm.  No indication for surgery.  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  We will continue with semiannual follow-up as noted above.  Question diverticulitis-he will follow-up with his general surgeon regarding those findings  Plan: Return in 6 months with CT angiogram of chest abdomen and pelvis  Reproductive/Obstetrics                            Anesthesia Physical Anesthesia Plan  ASA: III  Anesthesia Plan: General   Post-op Pain Management:     Induction: Intravenous  PONV Risk Score and Plan: 1 and Propofol infusion, TIVA and Treatment may vary due to age or medical condition  Airway Management Planned: Nasal Cannula and Natural Airway  Additional Equipment: None  Intra-op Plan:   Post-operative Plan:   Informed Consent: I have reviewed the patients History and Physical, chart, labs and discussed the procedure including the risks, benefits and alternatives for the proposed anesthesia with the patient or authorized representative who has indicated his/her understanding and acceptance.       Plan Discussed with: CRNA  Anesthesia Plan Comments:         Anesthesia Quick Evaluation

## 2019-11-25 ENCOUNTER — Encounter
Admission: RE | Disposition: A | Discharge status: Home / Self Care | Payer: Self-pay | Source: Home / Self Care | Attending: Gastroenterology

## 2019-11-25 ENCOUNTER — Ambulatory Visit: Payer: Medicare Other | Admitting: Anesthesiology

## 2019-11-25 ENCOUNTER — Encounter: Payer: Self-pay | Admitting: Gastroenterology

## 2019-11-25 ENCOUNTER — Ambulatory Visit
Admission: RE | Admit: 2019-11-25 | Discharge: 2019-11-25 | Disposition: A | Discharge status: Home / Self Care | Payer: Medicare Other | Attending: Gastroenterology | Admitting: Gastroenterology

## 2019-11-25 ENCOUNTER — Other Ambulatory Visit: Payer: Self-pay

## 2019-11-25 DIAGNOSIS — D120 Benign neoplasm of cecum: Secondary | ICD-10-CM | POA: Diagnosis not present

## 2019-11-25 DIAGNOSIS — Z85828 Personal history of other malignant neoplasm of skin: Secondary | ICD-10-CM | POA: Diagnosis not present

## 2019-11-25 DIAGNOSIS — Z1211 Encounter for screening for malignant neoplasm of colon: Secondary | ICD-10-CM | POA: Insufficient documentation

## 2019-11-25 DIAGNOSIS — I509 Heart failure, unspecified: Secondary | ICD-10-CM | POA: Diagnosis not present

## 2019-11-25 DIAGNOSIS — Z86718 Personal history of other venous thrombosis and embolism: Secondary | ICD-10-CM | POA: Insufficient documentation

## 2019-11-25 DIAGNOSIS — E669 Obesity, unspecified: Secondary | ICD-10-CM | POA: Insufficient documentation

## 2019-11-25 DIAGNOSIS — Z86010 Personal history of colonic polyps: Secondary | ICD-10-CM | POA: Insufficient documentation

## 2019-11-25 DIAGNOSIS — I712 Thoracic aortic aneurysm, without rupture: Secondary | ICD-10-CM | POA: Diagnosis not present

## 2019-11-25 DIAGNOSIS — E7800 Pure hypercholesterolemia, unspecified: Secondary | ICD-10-CM | POA: Diagnosis not present

## 2019-11-25 DIAGNOSIS — D128 Benign neoplasm of rectum: Secondary | ICD-10-CM | POA: Diagnosis not present

## 2019-11-25 DIAGNOSIS — Z8249 Family history of ischemic heart disease and other diseases of the circulatory system: Secondary | ICD-10-CM | POA: Diagnosis not present

## 2019-11-25 DIAGNOSIS — Z6831 Body mass index (BMI) 31.0-31.9, adult: Secondary | ICD-10-CM | POA: Insufficient documentation

## 2019-11-25 DIAGNOSIS — Z79899 Other long term (current) drug therapy: Secondary | ICD-10-CM | POA: Diagnosis not present

## 2019-11-25 DIAGNOSIS — K5730 Diverticulosis of large intestine without perforation or abscess without bleeding: Secondary | ICD-10-CM | POA: Diagnosis not present

## 2019-11-25 DIAGNOSIS — Z8349 Family history of other endocrine, nutritional and metabolic diseases: Secondary | ICD-10-CM | POA: Insufficient documentation

## 2019-11-25 DIAGNOSIS — H409 Unspecified glaucoma: Secondary | ICD-10-CM | POA: Insufficient documentation

## 2019-11-25 DIAGNOSIS — K621 Rectal polyp: Secondary | ICD-10-CM

## 2019-11-25 DIAGNOSIS — K635 Polyp of colon: Secondary | ICD-10-CM | POA: Diagnosis not present

## 2019-11-25 DIAGNOSIS — F17210 Nicotine dependence, cigarettes, uncomplicated: Secondary | ICD-10-CM | POA: Insufficient documentation

## 2019-11-25 HISTORY — PX: COLONOSCOPY WITH PROPOFOL: SHX5780

## 2019-11-25 HISTORY — PX: POLYPECTOMY: SHX5525

## 2019-11-25 SURGERY — COLONOSCOPY WITH PROPOFOL
Anesthesia: General | Site: Rectum

## 2019-11-25 MED ORDER — LACTATED RINGERS IV SOLN: INTRAVENOUS | Status: DC

## 2019-11-25 MED ORDER — PROPOFOL 10 MG/ML IV BOLUS
INTRAVENOUS | Status: DC | PRN
  Administered 2019-11-25 (×2): 100 mg via INTRAVENOUS

## 2019-11-25 MED ORDER — STERILE WATER FOR IRRIGATION IR SOLN
Status: DC | PRN
  Administered 2019-11-25: .05 mL

## 2019-11-25 MED ORDER — LIDOCAINE HCL (CARDIAC) PF 100 MG/5ML IV SOSY
PREFILLED_SYRINGE | INTRAVENOUS | Status: DC | PRN
  Administered 2019-11-25: 50 mg via INTRAVENOUS

## 2019-11-25 SURGICAL SUPPLY — 15 items
ELECT REM PT RETURN 9FT ADLT (ELECTROSURGICAL) ×4
ELECTRODE REM PT RTRN 9FT ADLT (ELECTROSURGICAL) ×2 IMPLANT
FORCEPS BIOP RAD 4 LRG CAP 4 (CUTTING FORCEPS) ×4 IMPLANT
GOWN CVR UNV OPN BCK APRN NK (MISCELLANEOUS) ×4 IMPLANT
GOWN ISOL THUMB LOOP REG UNIV (MISCELLANEOUS) ×8
INJECTOR VARIJECT VIN23 (MISCELLANEOUS) ×2 IMPLANT
KIT PRC NS LF DISP ENDO (KITS) ×2 IMPLANT
KIT PROCEDURE OLYMPUS (KITS) ×4
MANIFOLD NEPTUNE II (INSTRUMENTS) ×4 IMPLANT
MARKER SPOT ENDO TATTOO 5ML (MISCELLANEOUS) ×2 IMPLANT
SNARE SHORT THROW 13M SML OVAL (MISCELLANEOUS) ×4 IMPLANT
SPOT EX ENDOSCOPIC TATTOO (MISCELLANEOUS) ×2
TRAP ETRAP POLY (MISCELLANEOUS) ×4 IMPLANT
VARIJECT INJECTOR VIN23 (MISCELLANEOUS) ×4
WATER STERILE IRR 250ML POUR (IV SOLUTION) ×4 IMPLANT

## 2019-11-25 NOTE — Anesthesia Procedure Notes (Signed)
Procedure Name: General with mask airway Date/Time: 11/25/2019 8:05 AM Performed by: Jeannene Patella, CRNA Pre-anesthesia Checklist: Patient identified, Suction available, Emergency Drugs available, Patient being monitored and Timeout performed Patient Re-evaluated:Patient Re-evaluated prior to induction Oxygen Delivery Method: Nasal cannula

## 2019-11-25 NOTE — Anesthesia Postprocedure Evaluation (Signed)
Anesthesia Post Note  Patient: RAJEEV ESCUE  Procedure(s) Performed: COLONOSCOPY WITH PROPOFOL (N/A Rectum) POLYPECTOMY (Rectum)     Patient location during evaluation: PACU Anesthesia Type: General Level of consciousness: awake and alert Pain management: pain level controlled Vital Signs Assessment: post-procedure vital signs reviewed and stable Respiratory status: spontaneous breathing, nonlabored ventilation, respiratory function stable and patient connected to nasal cannula oxygen Cardiovascular status: blood pressure returned to baseline and stable Postop Assessment: no apparent nausea or vomiting Anesthetic complications: no   No complications documented.  Adele Barthel Sakara Lehtinen

## 2019-11-25 NOTE — Transfer of Care (Signed)
Immediate Anesthesia Transfer of Care Note  Patient: ASBURY HAIR  Procedure(s) Performed: COLONOSCOPY WITH PROPOFOL (N/A Rectum) POLYPECTOMY (Rectum)  Patient Location: PACU  Anesthesia Type: General  Level of Consciousness: awake, alert  and patient cooperative  Airway and Oxygen Therapy: Patient Spontanous Breathing and Patient connected to supplemental oxygen  Post-op Assessment: Post-op Vital signs reviewed, Patient's Cardiovascular Status Stable, Respiratory Function Stable, Patent Airway and No signs of Nausea or vomiting  Post-op Vital Signs: Reviewed and stable  Complications: No complications documented.

## 2019-11-25 NOTE — Op Note (Signed)
[facility]astroenter[facility] Patient Name: Mark Hooper Procedure Date: 11/25/2019 7:55 AM MRN: [id number] Account #: [id number] Date of Birth: 1945-07-09 Admit Type: Outpatient Age: 74 Room: [facility] OR ROOM 01 Gender: Male Note Status: Finalized Procedure:             Colonoscopy Indications:           High risk colon cancer surveillance: Personal history                         of colonic polyps Providers:             Lucilla Lame MD, MD Referring MD:          Halina Maidens, MD (Referring MD) Medicines:             Propofol per Anesthesia Complications:         No immediate complications. Procedure:             Pre-Anesthesia Assessment:                        - Prior to the procedure, a History and Physical was                         performed, and patient medications and allergies were                         reviewed. The patient's tolerance of previous                         anesthesia was also reviewed. The risks and benefits                         of the procedure and the sedation options and risks                         were discussed with the patient. All questions were                         answered, and informed consent was obtained. Prior                         Anticoagulants: The patient has taken no previous                         anticoagulant or antiplatelet agents. ASA Grade                         Assessment: II - A patient with mild systemic disease.                         After reviewing the risks and benefits, the patient                         was deemed in satisfactory condition to undergo the                         procedure.  After obtaining informed consent, the colonoscope was                         passed under direct vision. Throughout the procedure,                         the patient's blood pressure, pulse, and oxygen                         saturations were monitored continuously. The                          Colonoscope was introduced through the anus and                         advanced to the the cecum, identified by appendiceal                         orifice and ileocecal valve. The colonoscopy was                         performed without difficulty. The patient tolerated                         the procedure well. The quality of the bowel                         preparation was good. Findings:      The perianal and digital rectal examinations were normal.      A 8 mm polyp was found in the rectum. The polyp was sessile. The polyp       was removed with a hot snare. Resection and retrieval were complete.      A 2 mm polyp was found in the cecum. The polyp was sessile. The polyp       was removed with a cold biopsy forceps. Resection and retrieval were       complete.      Three sessile polyps were found in the sigmoid colon. The polyps were       large in size. These polyps were removed with a hot snare. Resection and       retrieval were complete. Area was successfully injected with 4 mL Spot       (carbon black) for tattooing.      Multiple small-mouthed diverticula were found in the sigmoid colon and       descending colon. Impression:            - One 8 mm polyp in the rectum, removed with a hot                         snare. Resected and retrieved.                        - One 2 mm polyp in the cecum, removed with a cold                         biopsy forceps. Resected and retrieved.                        -  Three large polyps in the sigmoid colon, removed                         with a hot snare. Resected and retrieved. Injected.                        - Diverticulosis in the sigmoid colon and in the                         descending colon. Recommendation:        - Discharge patient to home.                        - Resume previous diet.                        - Continue present medications.                        - Await pathology results.                        - If  the polyp in the sigmoid adenomatous then                         consider surgical removal. Procedure Code(s):     --- Professional ---                        [phone], Colonoscopy, flexible; with removal of                         tumor(s), polyp(s), or other lesion(s) by snare                         technique                        45380, 59, Colonoscopy, flexible; with biopsy, single                         or multiple                        45381, Colonoscopy, flexible; with directed submucosal                         injection(s), any substance Diagnosis Code(s):     --- Professional ---                        Z86.010, Personal history of colonic polyps                        K62.1, Rectal polyp                        K63.5, Polyp of colon CPT copyright 2019 American Medical Association. All rights reserved. The codes documented in this report are preliminary and upon coder review may  be revised to meet current compliance requirements. Lucilla Lame MD, MD 11/25/2019 8:26:34 AM This report has been signed electronically. Number of Addenda: 0 Note Initiated On: 11/25/2019  7:55 AM Scope Withdrawal Time: 0 hours 12 minutes 17 seconds  Total Procedure Duration: 0 hours 18 minutes 26 seconds  Estimated Blood Loss:  Estimated blood loss: none.      [facility]

## 2019-11-25 NOTE — H&P (Signed)
Lucilla Lame, MD [facility] [address] [place], [place] [address] [phone] Fax : [phone]  Primary Care Physician:  Glean Hess, MD Primary Gastroenterologist:  Dr. Allen Norris  Pre-Procedure History & Physical: HPI:  Mark Hooper is a 74 y.o. male is here for an colonoscopy.   Past Medical History:  Diagnosis Date  . Abdominal aortic aneurysm (AAA) ([place]) 02/02/2018  . Aneurysm, aortic ([place])   . Basal cell carcinoma 2016  . CHF (congestive heart failure) ([place])   . Diverticulitis   . DVT (deep venous thrombosis) ([place])   . Glaucoma   . Hypercholesteremia   . Thoracic aortic atherosclerosis ([place]) 02/02/2018  . Wears dentures    partial upper    Past Surgical History:  Procedure Laterality Date  . basal cell cancer excision    . CATARACT EXTRACTION    . COLONOSCOPY    . COLONOSCOPY WITH PROPOFOL N/A 10/27/2014   Procedure: COLONOSCOPY WITH PROPOFOL;  Surgeon: Lucilla Lame, MD;  Location: [place];  Service: Endoscopy;  Laterality: N/A;  . POLYPECTOMY  10/27/2014   Procedure: POLYPECTOMY;  Surgeon: Lucilla Lame, MD;  Location: [place];  Service: Endoscopy;;    Prior to Admission medications   Medication Sig Start Date End Date Taking? Authorizing Provider  latanoprost (XALATAN) 0.005 % ophthalmic solution 1 drop at bedtime.   Yes [provider]  simvastatin (ZOCOR) 20 MG tablet TAKE 1 TABLET DAILY 10/16/19  Yes Glean Hess, MD  CHANTIX 1 MG tablet TAKE 1 TABLET BY MOUTH TWICE DAILY( EVERY TWELVE HOURS) Patient not taking: Reported on 10/26/2019 08/22/19   Glean Hess, MD    Allergies as of 10/26/2019  . (No Known Allergies)    Family History  Problem Relation Age of Onset  . Hyperlipidemia Father   . CAD Father   . Prostate cancer Neg Hx   . Bladder Cancer Neg Hx   . Kidney cancer Neg Hx     Social History   Socioeconomic History  . Marital status: Married    Spouse name: Not on file  . Number of  children: 2  . Years of education: Not on file  . Highest education level: Associate degree: academic program  Occupational History  . Occupation: Armed forces operational officer  Tobacco Use  . Smoking status: Current Every Day Smoker    Packs/day: 0.50    Years: 55.00    Pack years: 27.50    Types: Cigarettes  . Smokeless tobacco: Never Used  . Tobacco comment: on chantix  Vaping Use  . Vaping Use: Never used  Substance and Sexual Activity  . Alcohol use: Yes    Alcohol/week: 5.0 standard drinks    Types: 1 Shots of liquor, 4 Standard drinks or equivalent per week    Comment: rare  . Drug use: Never  . Sexual activity: Not on file  Other Topics Concern  . Not on file  Social History Narrative  . Not on file   Social Determinants of Health   Financial Resource Strain: Low Risk   . Difficulty of Paying Living Expenses: Not hard at all  Food Insecurity: No Food Insecurity  . Worried About Charity fundraiser in the Last Year: Never true  . Ran Out of Food in the Last Year: Never true  Transportation Needs: No Transportation Needs  . Lack of Transportation (Medical): No  . Lack of Transportation (Non-Medical): No  Physical Activity: Sufficiently Active  . Days of Exercise per Week:  3 days  . Minutes of Exercise per Session: 60 min  Stress: No Stress Concern Present  . Feeling of Stress : Not at all  Social Connections: Moderately Integrated  . Frequency of Communication with Friends and Family: More than three times a week  . Frequency of Social Gatherings with Friends and Family: Three times a week  . Attends Religious Services: More than 4 times per year  . Active Member of Clubs or Organizations: No  . Attends Archivist Meetings: Never  . Marital Status: Married  Human resources officer Violence: Not At Risk  . Fear of Current or Ex-Partner: No  . Emotionally Abused: No  . Physically Abused: No  . Sexually Abused: No    Review of Systems: See HPI, otherwise negative  ROS  Physical Exam: BP 137/77   Pulse 64   Temp 98 F (36.7 C) (Temporal)   Ht 6' (1.829 m)   Wt 103.4 kg   SpO2 95%   BMI 30.92 kg/m  General:   Alert,  pleasant and cooperative in NAD Head:  Normocephalic and atraumatic. Neck:  Supple; no masses or thyromegaly. Lungs:  Clear throughout to auscultation.    Heart:  Regular rate and rhythm. Abdomen:  Soft, nontender and nondistended. Normal bowel sounds, without guarding, and without rebound.   Neurologic:  Alert and  oriented x4;  grossly normal neurologically. / Impression/Plan: Mark Hooper is here for an colonoscopy to be performed for a history of adenomatous polyps on    Risks, benefits, limitations, and alternatives regarding  colonoscopy have been reviewed with the patient.  Questions have been answered.  All parties agreeable.   Lucilla Lame, MD  11/25/2019, 7:19 AM

## 2019-11-28 ENCOUNTER — Encounter: Payer: Self-pay | Admitting: Gastroenterology

## 2019-11-28 LAB — SURGICAL PATHOLOGY

## 2019-11-29 ENCOUNTER — Other Ambulatory Visit: Payer: Medicare Other

## 2019-11-29 ENCOUNTER — Encounter: Payer: Medicare Other | Admitting: Thoracic Surgery (Cardiothoracic Vascular Surgery)

## 2019-11-30 ENCOUNTER — Ambulatory Visit
Admission: RE | Admit: 2019-11-30 | Discharge: 2019-11-30 | Disposition: A | Discharge status: Home / Self Care | Payer: Medicare Other | Source: Ambulatory Visit | Attending: Thoracic Surgery (Cardiothoracic Vascular Surgery) | Admitting: Thoracic Surgery (Cardiothoracic Vascular Surgery)

## 2019-11-30 ENCOUNTER — Other Ambulatory Visit: Payer: Self-pay

## 2019-11-30 DIAGNOSIS — I714 Abdominal aortic aneurysm, without rupture, unspecified: Secondary | ICD-10-CM

## 2019-11-30 DIAGNOSIS — I712 Thoracic aortic aneurysm, without rupture, unspecified: Secondary | ICD-10-CM

## 2019-11-30 MED ORDER — IOPAMIDOL (ISOVUE-370) INJECTION 76%
75.0000 mL | Freq: Once | INTRAVENOUS | Status: AC | PRN
  Administered 2019-11-30: 75 mL via INTRAVENOUS

## 2019-12-01 ENCOUNTER — Encounter: Payer: Medicare Other | Admitting: Thoracic Surgery (Cardiothoracic Vascular Surgery)

## 2019-12-06 ENCOUNTER — Ambulatory Visit (INDEPENDENT_AMBULATORY_CARE_PROVIDER_SITE_OTHER): Payer: Medicare Other | Admitting: Thoracic Surgery (Cardiothoracic Vascular Surgery)

## 2019-12-06 ENCOUNTER — Encounter: Payer: Self-pay | Admitting: Thoracic Surgery (Cardiothoracic Vascular Surgery)

## 2019-12-06 ENCOUNTER — Other Ambulatory Visit: Payer: Self-pay

## 2019-12-06 VITALS — BP 129/79 | HR 87 | Temp 97.7°F | Resp 21 | Ht 72.0 in | Wt 232.4 lb

## 2019-12-06 DIAGNOSIS — I77819 Aortic ectasia, unspecified site: Secondary | ICD-10-CM | POA: Diagnosis not present

## 2019-12-06 NOTE — Progress Notes (Signed)
[address]       [place] [address]             [id number]     HPI: [name] returns for follow-up of his multiple aortic aneurysms.  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  He had a syncopal spell while [place] at the airport [place].  That was the first time he was found to have aneurysmal disease.  I have been following him since then.  Last saw him 6 months ago.  At that time the CT of the chest showed evidence of an infrarenal abdominal aneurysm.  He then had an abdominal and pelvic CT which showed a 4.4 cm infrarenal aneurysm and bilateral iliac aneurysms right greater than left.  In the interim since last visit he has been feeling well.  His golf game has not been going well.  He continues to smoke about half a pack of cigarettes daily.  His Chantix was recalled so he has not been able to use that.  Nicotine substitutes have not been effective.  Past Medical History:  Diagnosis Date  . Abdominal aortic aneurysm (AAA) ([place]) 02/02/2018  . Aneurysm, aortic ([place])   . Basal cell carcinoma 2016  . CHF (congestive heart failure) ([place])   . Diverticulitis   . DVT (deep venous thrombosis) ([place])   . Glaucoma   . Hypercholesteremia   . Thoracic aortic atherosclerosis ([place]) 02/02/2018  . Wears dentures    partial upper    Current Outpatient Medications  Medication Sig Dispense Refill  . CHANTIX 1 MG tablet TAKE 1 TABLET BY MOUTH TWICE DAILY( EVERY TWELVE HOURS) 56 tablet 5  . latanoprost (XALATAN) 0.005 % ophthalmic solution 1 drop at bedtime.    . simvastatin (ZOCOR) 20 MG tablet TAKE 1 TABLET DAILY 90 tablet 3   No current facility-administered medications for this visit.    Physical Exam BP 129/79 (BP Location: Right Arm, Patient  Position: Sitting, Cuff Size: Normal)   Pulse 87   Temp 97.7 F (36.5 C)   Resp (!) 21   Ht 6' (1.829 m)   Wt 232 lb 6.4 oz (105.4 kg)   SpO2 94% Comment: RA  BMI 31.39 kg/m  74 year old man in no acute distress Alert and oriented x3 with no focal deficits No carotid bruits Cardiac regular rate and rhythm Lungs clear bilaterally with no rales or wheezing No peripheral edema  Diagnostic Tests: CT angio chest abdomen and pelvis IMPRESSION: 1. Stable irregular aneurysmal dilatation throughout the aortic isthmus and descending thoracic aorta, maximum diameter 4.7 cm in the region of the aortic isthmus, unchanged using similar measurement technique. Recommend semi-annual imaging followup by CTA or MRA and referral to cardiothoracic surgery if not already obtained. This recommendation follows 2010 ACCF/AHA/AATS/ACR/ASA/SCA/SCAI/SIR/STS/SVM Guidelines for the Diagnosis and Management of Patients With Thoracic Aortic Disease. Circulation. 2010; 121: B147-W29. Aortic aneurysm NOS (ICD10-I71.9). 2. Stable irregular aneurysmal dilatation of the entire infrarenal abdominal aorta, maximum diameter 4.7 cm, unchanged using similar measurement technique. Recommend follow-up CT/MR every 6 months and vascular consultation. This recommendation follows ACR consensus guidelines: White Paper of the ACR Incidental Findings Committee II on Vascular Findings. J Am Coll Radiol 2013; 10:789-794. 3. Stable bilateral common iliac artery aneurysms, right greater than left. 4. Moderate diffuse colonic diverticulosis. Nonspecific mild-to-moderate colonic wall  thickening throughout the mid to distal sigmoid colon, not substantially changed, presumably due to chronic diverticular disease. Correlation with the patient's colon cancer screening history is recommended. If screening is not up-to-date, appropriate screening should be considered. 5. Chronic pancreatitis. 6. Mild prostatomegaly. 7. Aortic  Atherosclerosis (ICD10-I70.0) and Emphysema (ICD10-J43.9).   Electronically Signed   By: Ilona Sorrel M.D.   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   Thoracic aortic atherosclerosis-on simvastatin  Hyperlipidemia-on simvastatin  Thoracic aortic penetrating ulcer/aneurysms-stable.  There is a great degree of variation from the radiologist radiologist in terms of the numbers they report.  Today reported to be 4.7 cm.  6 months ago reported to be 5.2 cm.  Looking scan to scan there has been no interval change.   Infrarenal abdominal and iliac aneurysms-stable for 6 months.  Given the complexity of his aortic disease I think it would be reasonable to go ahead and consult vascular surgery although I do not think there is a definite indication for surgery right now.  Tobacco abuse-importance of tobacco cessation emphasized.  Plan: Refer to vascular surgery Return in 6 months with CT angiogram of chest abdomen and pelvis  I spent 20 minutes in review of records, review of images, and in consultation with [name] today [name], MD [facility] [phone]

## 2019-12-20 ENCOUNTER — Encounter: Payer: Medicare Other | Admitting: Internal Medicine

## 2020-01-02 ENCOUNTER — Other Ambulatory Visit: Payer: Self-pay

## 2020-01-02 ENCOUNTER — Ambulatory Visit (INDEPENDENT_AMBULATORY_CARE_PROVIDER_SITE_OTHER): Payer: Medicare Other | Admitting: Vascular Surgery

## 2020-01-02 ENCOUNTER — Encounter: Payer: Self-pay | Admitting: Vascular Surgery

## 2020-01-02 VITALS — BP 134/94 | HR 73 | Temp 98.7°F | Resp 18 | Ht 72.0 in | Wt 232.0 lb

## 2020-01-02 DIAGNOSIS — I714 Abdominal aortic aneurysm, without rupture, unspecified: Secondary | ICD-10-CM

## 2020-01-02 NOTE — Progress Notes (Signed)
[facility] [place]  Patient name: Mark Hooper MRN: [id number] DOB: 24-Sep-1945 Sex: male  REASON FOR CONSULT: Evaluation of abdominal aortic aneurysm and right common iliac artery aneurysm  HPI: Mark Hooper is a 74 y.o. male, here today for discussion of incidental finding of aneurysmal disease.  He had finding of thoracic abdominal and iliac artery aneurysm and this has been followed with serial CT scans.  He has had some progression of his infrarenal abdominal aortic aneurysm and is here today for further discussion of this.  He has seen Dr. Roxan Hockey for evaluation of his thoracic aneurysm and has plans for follow-up CT in 6 months.  He has no symptoms referable to his aneurysm.  He has no family history of aneurysm.  He is a long-term smoker and has attempted to stop and is down to half pack per day.  He is quite active playing golf 4 days a week.  Past Medical History:  Diagnosis Date  . Abdominal aortic aneurysm (AAA) ([place]) 02/02/2018  . Aneurysm, aortic ([place])   . Basal cell carcinoma 2016  . CHF (congestive heart failure) ([place])   . Diverticulitis   . DVT (deep venous thrombosis) ([place])   . Glaucoma   . Hypercholesteremia   . Thoracic aortic atherosclerosis ([place]) 02/02/2018  . Wears dentures    partial upper    Family History  Problem Relation Age of Onset  . Hyperlipidemia Father   . CAD Father   . Prostate cancer Neg Hx   . Bladder Cancer Neg Hx   . Kidney cancer Neg Hx     SOCIAL HISTORY: Social History   Socioeconomic History  . Marital status: Married    Spouse name: Not on file  . Number of children: 2  . Years of education: Not on file  . Highest education level: Associate degree: academic program  Occupational History  . Occupation: Armed forces operational officer  Tobacco Use  . Smoking status: Current Every Day Smoker    Packs/day: 0.50    Years: 55.00    Pack years: 27.50    Types: Cigarettes  . Smokeless tobacco: Never Used  .  Tobacco comment: on chantix  Vaping Use  . Vaping Use: Never used  Substance and Sexual Activity  . Alcohol use: Yes    Alcohol/week: 5.0 standard drinks    Types: 1 Shots of liquor, 4 Standard drinks or equivalent per week    Comment: rare  . Drug use: Never  . Sexual activity: Not on file  Other Topics Concern  . Not on file  Social History Narrative  . Not on file   Social Determinants of Health   Financial Resource Strain: Low Risk   . Difficulty of Paying Living Expenses: Not hard at all  Food Insecurity: No Food Insecurity  . Worried About Charity fundraiser in the Last Year: Never true  . Ran Out of Food in the Last Year: Never true  Transportation Needs: No Transportation Needs  . Lack of Transportation (Medical): No  . Lack of Transportation (Non-Medical): No  Physical Activity: Sufficiently Active  . Days of Exercise per Week: 3 days  . Minutes of Exercise per Session: 60 min  Stress: No Stress Concern Present  . Feeling of Stress : Not at all  Social Connections: Moderately Integrated  . Frequency of Communication with Friends and Family: More than three times a week  . Frequency of Social Gatherings with Friends and Family: Three  times a week  . Attends Religious Services: More than 4 times per year  . Active Member of Clubs or Organizations: No  . Attends Archivist Meetings: Never  . Marital Status: Married  Human resources officer Violence: Not At Risk  . Fear of Current or Ex-Partner: No  . Emotionally Abused: No  . Physically Abused: No  . Sexually Abused: No    No Known Allergies  Current Outpatient Medications  Medication Sig Dispense Refill  . latanoprost (XALATAN) 0.005 % ophthalmic solution 1 drop at bedtime.    . simvastatin (ZOCOR) 20 MG tablet TAKE 1 TABLET DAILY 90 tablet 3   No current facility-administered medications for this visit.    REVIEW OF SYSTEMS:  [X]  denotes positive finding, [ ]  denotes negative finding Cardiac   Comments:  Chest pain or chest pressure:    Shortness of breath upon exertion:    Short of breath when lying flat:    Irregular heart rhythm:        Vascular    Pain in calf, thigh, or hip brought on by ambulation:    Pain in feet at night that wakes you up from your sleep:     Blood clot in your veins:    Leg swelling:         Pulmonary    Oxygen at home:    Productive cough:     Wheezing:         Neurologic    Sudden weakness in arms or legs:     Sudden numbness in arms or legs:     Sudden onset of difficulty speaking or slurred speech:    Temporary loss of vision in one eye:     Problems with dizziness:         Gastrointestinal    Blood in stool:     Vomited blood:         Genitourinary    Burning when urinating:     Blood in urine:        Psychiatric    Major depression:         Hematologic    Bleeding problems:    Problems with blood clotting too easily:        Skin    Rashes or ulcers:        Constitutional    Fever or chills:      PHYSICAL EXAM: Vitals:   01/02/20 1055  BP: (!) 134/94  Pulse: 73  Resp: 18  Temp: 98.7 F (37.1 C)  TempSrc: Other (Comment)  SpO2: 94%  Weight: 232 lb (105.2 kg)  Height: 6' (1.829 m)    GENERAL: The patient is a well-nourished male, in no acute distress. The vital signs are documented above. VASCULAR: Carotid arteries without bruits bilaterally.  2+ femoral 2+ popliteal and 2+ dorsalis pedis pulses bilaterally.  No evidence of femoral or popliteal aneurysm by physical exam PULMONARY: There is good air exchange ABDOMEN: Soft and non-tender.  Obese no aneurysm palpable MUSCULOSKELETAL: There are no major deformities or cyanosis. NEUROLOGIC: No focal weakness or paresthesias are detected. SKIN: There are no ulcers or rashes noted. PSYCHIATRIC: The patient has a normal affect.  DATA:   CT scan from October 2021 was reviewed.  This revealed a 4.7 cm infrarenal aneurysm which was up slightly from 4.4cm 6 months  earlier.  He also has a 3.6 right common iliac artery aneurysm with some mural thrombus and this was unchanged since April 2021.  MEDICAL ISSUES:  I had long discussion with the patient regarding his multiple aneurysms.  Explained the threshold of elective repair for each of these.  Explained that his right common iliac artery is protein that level.  I would recommend that we see him in 6 months after his CT scan as planned with Dr. Roxan Hockey.  If he has evidence of continued or rapid growth would recommend repair.  I did discuss open surgical repair and also stent graft repair in the differences of each of these.  I also discussed symptoms of leaking aneurysm and he knows to present immediately to the emergency room should this occur   Rosetta Posner, MD [facility] [place] Office phone [phone]

## 2020-01-18 ENCOUNTER — Encounter: Payer: Self-pay | Admitting: Internal Medicine

## 2020-01-18 ENCOUNTER — Ambulatory Visit (INDEPENDENT_AMBULATORY_CARE_PROVIDER_SITE_OTHER): Payer: Medicare Other | Admitting: Internal Medicine

## 2020-01-18 ENCOUNTER — Other Ambulatory Visit: Payer: Self-pay

## 2020-01-18 VITALS — BP 122/76 | HR 70 | Temp 97.7°F | Ht 72.0 in | Wt 232.0 lb

## 2020-01-18 DIAGNOSIS — F17200 Nicotine dependence, unspecified, uncomplicated: Secondary | ICD-10-CM

## 2020-01-18 DIAGNOSIS — E782 Mixed hyperlipidemia: Secondary | ICD-10-CM

## 2020-01-18 DIAGNOSIS — I714 Abdominal aortic aneurysm, without rupture, unspecified: Secondary | ICD-10-CM

## 2020-01-18 DIAGNOSIS — R3914 Feeling of incomplete bladder emptying: Secondary | ICD-10-CM | POA: Diagnosis not present

## 2020-01-18 DIAGNOSIS — I700 Atherosclerosis of aorta: Secondary | ICD-10-CM | POA: Diagnosis not present

## 2020-01-18 DIAGNOSIS — N401 Enlarged prostate with lower urinary tract symptoms: Secondary | ICD-10-CM

## 2020-01-18 DIAGNOSIS — D72829 Elevated white blood cell count, unspecified: Secondary | ICD-10-CM

## 2020-01-18 DIAGNOSIS — R739 Hyperglycemia, unspecified: Secondary | ICD-10-CM

## 2020-01-18 LAB — POCT URINALYSIS DIPSTICK
Bilirubin, UA: NEGATIVE
Blood, UA: NEGATIVE
Glucose, UA: NEGATIVE
Ketones, UA: NEGATIVE
Leukocytes, UA: NEGATIVE
Nitrite, UA: NEGATIVE
Protein, UA: NEGATIVE
Spec Grav, UA: 1.025 (ref 1.010–1.025)
Urobilinogen, UA: 0.2 E.U./dL
pH, UA: 6 (ref 5.0–8.0)

## 2020-01-18 NOTE — Progress Notes (Signed)
Date:  01/18/2020   Name:  Mark Hooper   DOB:  Oct 20, 1945   MRN:  [id number]   Chief Complaint: Annual Exam  Mark Hooper is a 74 y.o. male who presents today for his Complete Annual Exam. He feels well. He reports exercising plays golf x4 days a week. He reports he is sleeping fairly well. He is still smoking <1/2 ppd cigarettes.  Colonoscopy: 11/2019  Immunization History  Administered Date(s) Administered  . Influenza, High Dose Seasonal PF 10/26/2017  . Influenza-Unspecified 12/19/2014, 12/03/2018, 11/19/2019  . PFIZER SARS-COV-2 Vaccination 03/29/2019, 04/19/2019  . Pneumococcal Conjugate-13 02/05/2015  . Pneumococcal Polysaccharide-23 01/05/2012  . Zoster Recombinat (Shingrix) 12/10/2018, 02/15/2019    Benign Prostatic Hypertrophy This is a chronic (seen by urology) problem. The problem is unchanged. Irritative symptoms do not include frequency. Obstructive symptoms include dribbling, incomplete emptying, a slower stream and a weak stream. Pertinent negatives include no chills or dysuria. Past treatments include tamsulosin and finasteride.  Hyperlipidemia This is a chronic problem. The problem is controlled. Exacerbating diseases include obesity. Pertinent negatives include no chest pain, myalgias or shortness of breath. (Thoracic aneurysm) Current antihyperlipidemic treatment includes statins. The current treatment provides significant improvement of lipids.  Thoracic and AAA - being followed with CT every 6 months.  The aneurysms are slightly larger but no surgery needed yet.  He continues on statin therapy and is still trying to cut back on smoking.   CTA 11/2019: IMPRESSION: 1. Stable irregular aneurysmal dilatation throughout the aortic isthmus and descending thoracic aorta, maximum diameter 4.7 cm in the region of the aortic isthmus, unchanged using similar measurement technique. Recommend semi-annual imaging followup by CTA or MRA and referral to  cardiothoracic surgery if not already obtained. This recommendation follows 2010 ACCF/AHA/AATS/ACR/ASA/SCA/SCAI/SIR/STS/SVM Guidelines for the Diagnosis and Management of Patients With Thoracic Aortic Disease. Circulation. 2010; 121: Z329-J24. Aortic aneurysm NOS (ICD10-I71.9). 2. Stable irregular aneurysmal dilatation of the entire infrarenal abdominal aorta, maximum diameter 4.7 cm, unchanged using similar measurement technique. Recommend follow-up CT/MR every 6 months and vascular consultation. This recommendation follows ACR consensus guidelines: White Paper of the ACR Incidental Findings Committee II on Vascular Findings. J Am Coll Radiol 2013; 10:789-794. 3. Stable bilateral common iliac artery aneurysms, right greater than left.  Lab Results  Component Value Date   CREATININE 1.08 12/10/2018   BUN 16 12/10/2018   NA 142 12/10/2018   K 4.8 12/10/2018   CL 105 12/10/2018   CO2 23 12/10/2018   Lab Results  Component Value Date   CHOL 137 12/10/2018   HDL 46 12/10/2018   LDLCALC 69 12/10/2018   TRIG 125 12/10/2018   CHOLHDL 3.0 12/10/2018   Lab Results  Component Value Date   TSH 2.20 08/02/2013   No results found for: HGBA1C Lab Results  Component Value Date   WBC 11.4 (H) 10/26/2017   HGB 17.0 10/26/2017   HCT 49.6 10/26/2017   MCV 93 10/26/2017   PLT 527 (H) 10/26/2017   Lab Results  Component Value Date   ALT 17 12/10/2018   AST 15 12/10/2018   ALKPHOS 73 12/10/2018   BILITOT 0.2 12/10/2018     Review of Systems  Constitutional: Negative for appetite change, chills, diaphoresis, fatigue and unexpected weight change.  HENT: Negative for hearing loss, tinnitus, trouble swallowing and voice change.   Eyes: Negative for visual disturbance.  Respiratory: Negative for choking, shortness of breath and wheezing.   Cardiovascular: Negative for chest pain, palpitations and leg swelling.  No claudication sx  Gastrointestinal: Negative for abdominal pain,  blood in stool, constipation and diarrhea.  Genitourinary: Positive for incomplete emptying. Negative for difficulty urinating, dysuria and frequency.  Musculoskeletal: Negative for arthralgias, back pain and myalgias.  Skin: Negative for color change and rash.  Neurological: Negative for dizziness, syncope and headaches.  Hematological: Negative for adenopathy.  Psychiatric/Behavioral: Negative for dysphoric mood and sleep disturbance.    Patient Active Problem List   Diagnosis Date Noted  . Polyp of sigmoid colon   . Rectal polyp   . Pulmonary nodules/lesions, multiple 02/12/2018  . Thoracic aortic atherosclerosis ([place]) 02/02/2018  . Abdominal aortic aneurysm (AAA) ([place]) 02/02/2018  . Aortic dilatation ([place]) 10/14/2017  . Benign prostatic hyperplasia with incomplete bladder emptying 02/05/2015  . Hx of colonic polyps   . Benign neoplasm of ascending colon   . Benign neoplasm of descending colon   . Benign neoplasm of sigmoid colon   . Tobacco use disorder 09/01/2014  . Basal cell carcinoma of nose 08/29/2014  . Mixed hyperlipidemia 08/29/2014  . Plantar fasciitis 08/29/2014  . History of malignant melanoma of skin 09/08/2013  . Hx of abdominal abscess 07/29/2013    No Known Allergies  Past Surgical History:  Procedure Laterality Date  . basal cell cancer excision    . CATARACT EXTRACTION    . COLONOSCOPY    . COLONOSCOPY WITH PROPOFOL N/A 10/27/2014   Procedure: COLONOSCOPY WITH PROPOFOL;  Surgeon: Lucilla Lame, MD;  Location: [facility];  Service: Endoscopy;  Laterality: N/A;  . COLONOSCOPY WITH PROPOFOL N/A 11/25/2019   Procedure: COLONOSCOPY WITH PROPOFOL;  Surgeon: Lucilla Lame, MD;  Location: [facility];  Service: Endoscopy;  Laterality: N/A;  priority 4  . POLYPECTOMY  10/27/2014   Procedure: POLYPECTOMY;  Surgeon: Lucilla Lame, MD;  Location: [facility];  Service: Endoscopy;;  . POLYPECTOMY  11/25/2019   Procedure: POLYPECTOMY;  Surgeon: Lucilla Lame, MD;  Location: [facility];  Service: Endoscopy;;    Social History   Tobacco Use  . Smoking status: Current Every Day Smoker    Packs/day: 0.50    Years: 55.00    Pack years: 27.50    Types: Cigarettes  . Smokeless tobacco: Never Used  . Tobacco comment: on chantix  Vaping Use  . Vaping Use: Never used  Substance Use Topics  . Alcohol use: Yes    Alcohol/week: 5.0 standard drinks    Types: 1 Shots of liquor, 4 Standard drinks or equivalent per week    Comment: rare  . Drug use: Never     Medication list has been reviewed and updated.  Current Meds  Medication Sig  . latanoprost (XALATAN) 0.005 % ophthalmic solution 1 drop at bedtime.  . simvastatin (ZOCOR) 20 MG tablet TAKE 1 TABLET DAILY    PHQ 2/9 Scores 01/18/2020 05/30/2019 04/18/2019 12/10/2018  PHQ - 2 Score 0 0 0 0  PHQ- 9 Score 0 0 - -    GAD 7 : Generalized Anxiety Score 01/18/2020 05/30/2019  Nervous, Anxious, on Edge 0 0  Control/stop worrying 0 0  Worry too much - different things 0 0  Trouble relaxing 0 0  Restless 0 0  Easily annoyed or irritable 0 0  Afraid - awful might happen 0 0  Total GAD 7 Score 0 0  Anxiety Difficulty - Not difficult at all    BP Readings from Last 3 Encounters:  01/18/20 122/76  01/02/20 (!) 134/94  12/06/19 129/79    Physical  Exam Vitals and nursing note reviewed.  Constitutional:      Appearance: Normal appearance. He is well-developed.  HENT:     Head: Normocephalic.     Right Ear: Tympanic membrane, ear canal and external ear normal.     Left Ear: Tympanic membrane, ear canal and external ear normal.     Nose: Nose normal.     Mouth/Throat:     Pharynx: Uvula midline.  Eyes:     Conjunctiva/sclera: Conjunctivae normal.     Pupils: Pupils are equal, round, and reactive to light.  Neck:     Thyroid: No thyromegaly.     Vascular: No carotid bruit.  Cardiovascular:     Rate and Rhythm: Normal rate and regular rhythm.  No extrasystoles are  present.    Pulses:          Dorsalis pedis pulses are 1+ on the right side and 1+ on the left side.       Posterior tibial pulses are 0 on the right side and 1+ on the left side.     Heart sounds: Normal heart sounds. No murmur heard.      Comments: Toes slightly dusky but warm and non tender Pulses decreased peripherally Pulmonary:     Effort: Pulmonary effort is normal.     Breath sounds: Normal breath sounds. No wheezing.  Chest:     Breasts:        Right: No mass.        Left: No mass.  Abdominal:     General: Bowel sounds are normal.     Palpations: Abdomen is soft.     Tenderness: There is no abdominal tenderness.  Musculoskeletal:        General: Normal range of motion.     Cervical back: Normal range of motion and neck supple.     Right lower leg: No edema.     Left lower leg: No edema.  Lymphadenopathy:     Cervical: No cervical adenopathy.  Skin:    General: Skin is warm and dry.  Neurological:     Mental Status: He is alert and oriented to person, place, and time.     Deep Tendon Reflexes: Reflexes are normal and symmetric.  Psychiatric:        Attention and Perception: Attention normal.        Mood and Affect: Mood normal.        Speech: Speech normal.        Behavior: Behavior normal.        Thought Content: Thought content normal.        Judgment: Judgment normal.     Wt Readings from Last 3 Encounters:  01/18/20 232 lb (105.2 kg)  01/02/20 232 lb (105.2 kg)  12/06/19 232 lb 6.4 oz (105.4 kg)    BP 122/76   Pulse 70   Temp 97.7 F (36.5 C) (Oral)   Ht 6' (1.829 m)   Wt 232 lb (105.2 kg)   SpO2 96%   BMI 31.46 kg/m   Assessment and Plan: 1. Abdominal aortic aneurysm (AAA) without rupture ([place]) Followed by VS and Cardiology Size stable Continue statin therapy; continue attempts to quit smoking  2. Thoracic aortic atherosclerosis ([facility]) Continue statin therapy and smoking cessation efforts  3. Mixed hyperlipidemia Tolerating statin  medication without side effects at this time LDL is at goal of < 70 on current dose Continue same therapy without change at this time unless LDL is > 70 -  Comprehensive metabolic panel - Lipid panel  4. Benign prostatic hyperplasia with incomplete bladder emptying Per patient, sx are stable and he did not benefit from Flomax or Proscar Continue to monitor sx and PSA - PSA - POCT urinalysis dipstick  5. Leukocytosis, unspecified type Noted on last several labs - uncertain significance - CBC with Differential/Platelet  6. Hyperglycemia - Hemoglobin A1c  7. Tobacco use disorder Chantix discontinued Pt did not benefit from gum or patches; he will continue to work on gradually cutting back on his own.   Partially dictated using Editor, commissioning. Any errors are unintentional.  Halina Maidens, MD [facility]  01/18/2020

## 2020-01-19 LAB — COMPREHENSIVE METABOLIC PANEL
ALT: 12 IU/L (ref 0–44)
AST: 11 IU/L (ref 0–40)
Albumin/Globulin Ratio: 1.5 (ref 1.2–2.2)
Albumin: 4.4 g/dL (ref 3.7–4.7)
Alkaline Phosphatase: 80 IU/L (ref 44–121)
BUN/Creatinine Ratio: 16 (ref 10–24)
BUN: 15 mg/dL (ref 8–27)
Bilirubin Total: 0.4 mg/dL (ref 0.0–1.2)
CO2: 21 mmol/L (ref 20–29)
Calcium: 10.3 mg/dL — ABNORMAL HIGH (ref 8.6–10.2)
Chloride: 100 mmol/L (ref 96–106)
Creatinine, Ser: 0.92 mg/dL (ref 0.76–1.27)
GFR calc Af Amer: 95 mL/min/{1.73_m2} (ref >59)
GFR calc non Af Amer: 82 mL/min/{1.73_m2} (ref >59)
Globulin, Total: 2.9 g/dL (ref 1.5–4.5)
Glucose: 96 mg/dL (ref 65–99)
Potassium: 4.6 mmol/L (ref 3.5–5.2)
Sodium: 139 mmol/L (ref 134–144)
Total Protein: 7.3 g/dL (ref 6.0–8.5)

## 2020-01-19 LAB — LIPID PANEL
Chol/HDL Ratio: 3.1 ratio (ref 0.0–5.0)
Cholesterol, Total: 155 mg/dL (ref 100–199)
HDL: 50 mg/dL (ref >39)
LDL Chol Calc (NIH): 81 mg/dL (ref 0–99)
Triglycerides: 140 mg/dL (ref 0–149)
VLDL Cholesterol Cal: 24 mg/dL (ref 5–40)

## 2020-01-19 LAB — CBC WITH DIFFERENTIAL/PLATELET
Basophils Absolute: 0.1 10*3/uL (ref 0.0–0.2)
Basos: 1 %
EOS (ABSOLUTE): 0 10*3/uL (ref 0.0–0.4)
Eos: 0 %
Hematocrit: 52.7 % — ABNORMAL HIGH (ref 37.5–51.0)
Hemoglobin: 18 g/dL — ABNORMAL HIGH (ref 13.0–17.7)
Immature Grans (Abs): 0 10*3/uL (ref 0.0–0.1)
Immature Granulocytes: 0 %
Lymphocytes Absolute: 2.2 10*3/uL (ref 0.7–3.1)
Lymphs: 22 %
MCH: 32.4 pg (ref 26.6–33.0)
MCHC: 34.2 g/dL (ref 31.5–35.7)
MCV: 95 fL (ref 79–97)
Monocytes Absolute: 0.6 10*3/uL (ref 0.1–0.9)
Monocytes: 6 %
Neutrophils Absolute: 7.1 10*3/uL — ABNORMAL HIGH (ref 1.4–7.0)
Neutrophils: 71 %
Platelets: 256 10*3/uL (ref 150–450)
RBC: 5.55 x10E6/uL (ref 4.14–5.80)
RDW: 12.8 % (ref 11.6–15.4)
WBC: 9.9 10*3/uL (ref 3.4–10.8)

## 2020-01-19 LAB — PSA: Prostate Specific Ag, Serum: 2.4 ng/mL (ref 0.0–4.0)

## 2020-01-19 LAB — HEMOGLOBIN A1C
Est. average glucose Bld gHb Est-mCnc: 126 mg/dL
Hgb A1c MFr Bld: 6 % — ABNORMAL HIGH (ref 4.8–5.6)

## 2020-04-18 ENCOUNTER — Ambulatory Visit (INDEPENDENT_AMBULATORY_CARE_PROVIDER_SITE_OTHER): Payer: Medicare Other

## 2020-04-18 ENCOUNTER — Other Ambulatory Visit: Payer: Self-pay

## 2020-04-18 VITALS — BP 104/62 | HR 80 | Temp 98.2°F | Resp 16 | Ht 72.0 in | Wt 230.8 lb

## 2020-04-18 DIAGNOSIS — Z0000 Encounter for general adult medical examination without abnormal findings: Secondary | ICD-10-CM | POA: Diagnosis not present

## 2020-04-18 NOTE — Progress Notes (Signed)
Subjective:   Mark Hooper is a 75 y.o. male who presents for Medicare Annual/Subsequent preventive examination.  Review of Systems     Cardiac Risk Factors include: advanced age (>51men, >77 women);dyslipidemia;male gender;smoking/ tobacco exposure;obesity (BMI >30kg/m2)     Objective:    Today's Vitals   04/18/20 1405  BP: 104/62  Pulse: 80  Resp: 16  Temp: 98.2 F (36.8 C)  TempSrc: Oral  SpO2: 95%  Weight: 230 lb 12.8 oz (104.7 kg)  Height: 6' (1.829 m)   Body mass index is 31.3 kg/m.  Advanced Directives 04/18/2020 11/25/2019 04/18/2019 04/12/2018 02/06/2016 10/27/2014  Does Patient Have a Medical Advance Directive? No No No Yes Yes Yes  Type of Advance Directive - - Public librarian;Living will Living will Mark Hooper;Living will  Copy of Mark Hooper in Chart? - - - No - copy requested - -  Would patient like information on creating a medical advance directive? Yes (MAU/Ambulatory/Procedural Areas - Information given) No - Patient declined Yes (MAU/Ambulatory/Procedural Areas - Information given) - - -    Current Medications (verified) Outpatient Encounter Medications as of 04/18/2020  Medication Sig  . latanoprost (XALATAN) 0.005 % ophthalmic solution 1 drop at bedtime.  . simvastatin (ZOCOR) 20 MG tablet TAKE 1 TABLET DAILY   No facility-administered encounter medications on file as of 04/18/2020.    Allergies (verified) Patient has no known allergies.   History: Past Medical History:  Diagnosis Date  . Abdominal aortic aneurysm (AAA) ([place]) 02/02/2018  . Aneurysm, aortic ([place])   . Basal cell carcinoma 2016  . CHF (congestive heart failure) ([place])   . Diverticulitis   . DVT (deep venous thrombosis) ([place])   . Glaucoma   . Hypercholesteremia   . Thoracic aortic atherosclerosis ([place]) 02/02/2018  . Wears dentures    partial upper   Past Surgical History:  Procedure Laterality Date  . basal cell cancer excision     . CATARACT EXTRACTION    . COLONOSCOPY    . COLONOSCOPY WITH PROPOFOL N/A 10/27/2014   Procedure: COLONOSCOPY WITH PROPOFOL;  Surgeon: Lucilla Lame, MD;  Location: [facility];  Service: Endoscopy;  Laterality: N/A;  . COLONOSCOPY WITH PROPOFOL N/A 11/25/2019   Procedure: COLONOSCOPY WITH PROPOFOL;  Surgeon: Lucilla Lame, MD;  Location: [facility];  Service: Endoscopy;  Laterality: N/A;  priority 4  . POLYPECTOMY  10/27/2014   Procedure: POLYPECTOMY;  Surgeon: Lucilla Lame, MD;  Location: [facility];  Service: Endoscopy;;  . POLYPECTOMY  11/25/2019   Procedure: POLYPECTOMY;  Surgeon: Lucilla Lame, MD;  Location: [facility];  Service: Endoscopy;;   Family History  Problem Relation Age of Onset  . Hyperlipidemia Father   . CAD Father   . Prostate cancer Neg Hx   . Bladder Cancer Neg Hx   . Kidney cancer Neg Hx    Social History   Socioeconomic History  . Marital status: Married    Spouse name: Not on file  . Number of children: 2  . Years of education: Not on file  . Highest education level: Associate degree: academic program  Occupational History  . Occupation: Armed forces operational officer  Tobacco Use  . Smoking status: Current Every Day Smoker    Packs/day: 0.50    Years: 55.00    Pack years: 27.50    Types: Cigarettes  . Smokeless tobacco: Never Used  . Tobacco comment: smoking cessation information given  Vaping Use  . Vaping Use:  Never used  Substance and Sexual Activity  . Alcohol use: Yes    Alcohol/week: 5.0 standard drinks    Types: 1 Shots of liquor, 4 Standard drinks or equivalent per week    Comment: rare  . Drug use: Never  . Sexual activity: Not on file  Other Topics Concern  . Not on file  Social History Narrative  . Not on file   Social Determinants of Health   Financial Resource Strain: Low Risk   . Difficulty of Paying Living Expenses: Not hard at all  Food Insecurity: Not on file  Transportation Needs: No Transportation Needs   . Lack of Transportation (Medical): No  . Lack of Transportation (Non-Medical): No  Physical Activity: Sufficiently Active  . Days of Exercise per Week: 7 days  . Minutes of Exercise per Session: 30 min  Stress: No Stress Concern Present  . Feeling of Stress : Not at all  Social Connections: Moderately Integrated  . Frequency of Communication with Friends and Family: More than three times a week  . Frequency of Social Gatherings with Friends and Family: More than three times a week  . Attends Religious Services: More than 4 times per year  . Active Member of Clubs or Organizations: No  . Attends Archivist Meetings: Never  . Marital Status: Married    Tobacco Counseling Ready to quit: Yes Counseling given: Yes Comment: smoking cessation information given   Clinical Intake:  Pre-visit preparation completed: Yes  Pain : No/denies pain     BMI - recorded: 31.3 Nutritional Status: BMI > 30  Obese Nutritional Risks: None Diabetes: No  How often do you need to have someone help you when you read instructions, pamphlets, or other written materials from your doctor or pharmacy?: 1 - Never    Interpreter Needed?: No  Information entered by :: Clemetine Marker LPN   Activities of Daily Living In your present state of health, do you have any difficulty performing the following activities: 04/18/2020 11/25/2019  Hearing? N N  Comment declines hearing aids -  Vision? N N  Difficulty concentrating or making decisions? N N  Walking or climbing stairs? N N  Dressing or bathing? N N  Doing errands, shopping? N -  Preparing Food and eating ? N -  Using the Toilet? N -  In the past six months, have you accidently leaked urine? N -  Do you have problems with loss of bowel control? N -  Managing your Medications? N -  Managing your Finances? N -  Housekeeping or managing your Housekeeping? N -  Some recent data might be hidden    Patient Care Team: Mark Hess, MD  as PCP - General (Internal Medicine) Mark Mantis, MD (Dermatology) Mark Nakayama, MD as Consulting Physician (Cardiothoracic Surgery)  Indicate any recent Medical Services you may have received from other than [facility] providers in the past year (date may be approximate).     Assessment:   This is a routine wellness examination for Mark Hooper.  Hearing/Vision screen  Hearing Screening   125Hz  250Hz  500Hz  1000Hz  2000Hz  3000Hz  4000Hz  6000Hz  8000Hz   Right ear:           Left ear:           Comments:  Pt denies hearing difficulty  Vision Screening Comments: [facility] with glaucoma specialist.   Dietary issues and exercise activities discussed: Current Exercise Habits: Home exercise routine, Type of exercise: walking, Time (Minutes):  30, Frequency (Times/Week): 7, Weekly Exercise (Minutes/Week): 210, Intensity: Mild, Exercise limited by: cardiac condition(s)  Goals    . Quit Smoking     Pt wants to try chantix again to help quit smoking. Currently using nicotine patches. Smoking cessation information given.   Pt has cut back to 1/2 pack per day as of 04/18/19.     . Quit smoking / using tobacco     Did not do well with Chantix due to nausea; will try Nicotine patches      Depression Screen PHQ 2/9 Scores 04/18/2020 01/18/2020 05/30/2019 04/18/2019 12/10/2018 04/12/2018 02/02/2018  PHQ - 2 Score 0 0 0 0 0 0 0  PHQ- 9 Score - 0 0 - - - -    Fall Risk Fall Risk  04/18/2020 01/18/2020 05/30/2019 04/18/2019 04/12/2018  Falls in the past year? 1 0 0 0 0  Comment - - - - -  Number falls in past yr: 0 - 0 0 0  Injury with Fall? 0 - 0 0 0  Risk for fall due to : No Fall Risks - No Fall Risks No Fall Risks -  Follow up Falls prevention discussed Falls evaluation completed Falls evaluation completed Falls prevention discussed Falls prevention discussed    FALL RISK PREVENTION PERTAINING TO THE HOME:  Any stairs in or around the home? Yes  If so, are there any without  handrails? No  Home free of loose throw rugs in walkways, pet beds, electrical cords, etc? Yes  Adequate lighting in your home to reduce risk of falls? Yes   ASSISTIVE DEVICES UTILIZED TO PREVENT FALLS:  Life alert? No  Use of a cane, walker or w/c? No  Grab bars in the bathroom? No  Shower chair or bench in shower? Yes  Elevated toilet seat or a handicapped toilet? No   TIMED UP AND GO:  Was the test performed? Yes .  Length of time to ambulate 10 feet: 4 sec.   Gait steady and fast without use of assistive device  Cognitive Function: Normal cognitive status assessed by direct observation by this Nurse Health Advisor. No abnormalities found.       6CIT Screen 04/12/2018 02/06/2016  What Year? 0 points 0 points  What month? 0 points 0 points  What time? 0 points 0 points  Count back from 20 0 points 0 points  Months in reverse 0 points 0 points  Repeat phrase 0 points 0 points  Total Score 0 0    Immunizations Immunization History  Administered Date(s) Administered  . Influenza, High Dose Seasonal PF 10/26/2017  . Influenza-Unspecified 12/19/2014, 12/03/2018, 11/19/2019  . PFIZER(Purple Top)SARS-COV-2 Vaccination 03/29/2019, 04/19/2019, 11/28/2019  . Pneumococcal Conjugate-13 02/05/2015  . Pneumococcal Polysaccharide-23 01/05/2012  . Zoster Recombinat (Shingrix) 12/10/2018, 02/15/2019    TDAP status: Due, Education has been provided regarding the importance of this vaccine. Advised may receive this vaccine at local pharmacy or Health Dept. Aware to provide a copy of the vaccination record if obtained from local pharmacy or Health Dept. Verbalized acceptance and understanding.  Flu Vaccine status: Up to date  Pneumococcal vaccine status: Up to date  Covid-19 vaccine status: Completed vaccines  Qualifies for Shingles Vaccine? Yes   Zostavax completed Yes   Shingrix Completed?: Yes  Screening Tests Health Maintenance  Topic Date Due  . TETANUS/TDAP  05/29/2020  (Originally 08/03/1964)  . COVID-19 Vaccine (4 - Booster for Pfizer series) 05/28/2020  . COLONOSCOPY (Pts 45-35yrs Insurance coverage will need to be confirmed)  11/24/2024  . INFLUENZA VACCINE  Completed  . PNA vac Low Risk Adult  Completed  . Hepatitis C Screening  Addressed  . HPV VACCINES  Aged Out    Health Maintenance  There are no preventive care reminders to display for this patient.  Colorectal cancer screening: Type of screening: Colonoscopy. Completed 11/25/19. Repeat every 5 years  Lung Cancer Screening: (Low Dose CT Chest recommended if Age 34-80 years, 30 pack-year currently smoking OR have quit w/in 15years.) does not qualify.   Lung Cancer Screening Referral:  Additional Screening:  Hepatitis C Screening: does qualify; Completed 02/05/15  Vision Screening: Recommended annual ophthalmology exams for early detection of glaucoma and other disorders of the eye. Is the patient up to date with their annual eye exam?  Yes  Who is the provider or what is the name of the office in which the patient attends annual eye exams? [facility]  Dental Screening: Recommended annual dental exams for proper oral hygiene  Community Resource Referral / Chronic Care Management: CRR required this visit?  No   CCM required this visit?  No      Plan:     I have personally reviewed and noted the following in the patient's chart:   . Medical and social history . Use of alcohol, tobacco or illicit drugs  . Current medications and supplements . Functional ability and status . Nutritional status . Physical activity . Advanced directives . List of other physicians . Hospitalizations, surgeries, and ER visits in previous 12 months . Vitals . Screenings to include cognitive, depression, and falls . Referrals and appointments  In addition, I have reviewed and discussed with patient certain preventive protocols, quality metrics, and best practice recommendations. A written personalized care  plan for preventive services as well as general preventive health recommendations were provided to patient.     Clemetine Marker, LPN   [id number]   Nurse Notes: none

## 2020-04-18 NOTE — Patient Instructions (Signed)
Mark Hooper , Thank you for taking time to come for your Medicare Wellness Visit. I appreciate your ongoing commitment to your health goals. Please review the following plan we discussed and let me know if I can assist you in the future.   Screening recommendations/referrals: Colonoscopy: done 11/25/19 Recommended yearly ophthalmology/optometry visit for glaucoma screening and checkup Recommended yearly dental visit for hygiene and checkup  Vaccinations: Influenza vaccine: done 11/19/19 Pneumococcal vaccine: done 02/05/15 Tdap vaccine: due Shingles vaccine: done 12/10/18 & 02/15/19   Covid-19: done 03/29/19, 04/19/19 & 11/28/19  Advanced directives: Advance directive discussed with you today. I have provided a copy for you to complete at home and have notarized. Once this is complete please bring a copy in to our office so we can scan it into your chart.  Conditions/risks identified: Keep up the great work!  Next appointment: Follow up in one year for your annual wellness visit.   Preventive Care 68 Years and Older, Male Preventive care refers to lifestyle choices and visits with your health care provider that can promote health and wellness. What does preventive care include?  A yearly physical exam. This is also called an annual well check.  Dental exams once or twice a year.  Routine eye exams. Ask your health care provider how often you should have your eyes checked.  Personal lifestyle choices, including:  Daily care of your teeth and gums.  Regular physical activity.  Eating a healthy diet.  Avoiding tobacco and drug use.  Limiting alcohol use.  Practicing safe sex.  Taking low doses of aspirin every day.  Taking vitamin and mineral supplements as recommended by your health care provider. What happens during an annual well check? The services and screenings done by your health care provider during your annual well check will depend on your age, overall health,  lifestyle risk factors, and family history of disease. Counseling  Your health care provider may ask you questions about your:  Alcohol use.  Tobacco use.  Drug use.  Emotional well-being.  Home and relationship well-being.  Sexual activity.  Eating habits.  History of falls.  Memory and ability to understand (cognition).  Work and work Statistician. Screening  You may have the following tests or measurements:  Height, weight, and BMI.  Blood pressure.  Lipid and cholesterol levels. These may be checked every 5 years, or more frequently if you are over 85 years old.  Skin check.  Lung cancer screening. You may have this screening every year starting at age 75 if you have a 30-pack-year history of smoking and currently smoke or have quit within the past 15 years.  Fecal occult blood test (FOBT) of the stool. You may have this test every year starting at age 54.  Flexible sigmoidoscopy or colonoscopy. You may have a sigmoidoscopy every 5 years or a colonoscopy every 10 years starting at age 4.  Prostate cancer screening. Recommendations will vary depending on your family history and other risks.  Hepatitis C blood test.  Hepatitis B blood test.  Sexually transmitted disease (STD) testing.  Diabetes screening. This is done by checking your blood sugar (glucose) after you have not eaten for a while (fasting). You may have this done every 1-3 years.  Abdominal aortic aneurysm (AAA) screening. You may need this if you are a current or former smoker.  Osteoporosis. You may be screened starting at age 56 if you are at high risk. Talk with your health care provider about your test results, treatment  options, and if necessary, the need for more tests. Vaccines  Your health care provider may recommend certain vaccines, such as:  Influenza vaccine. This is recommended every year.  Tetanus, diphtheria, and acellular pertussis (Tdap, Td) vaccine. You may need a Td booster  every 10 years.  Zoster vaccine. You may need this after age 90.  Pneumococcal 13-valent conjugate (PCV13) vaccine. One dose is recommended after age 12.  Pneumococcal polysaccharide (PPSV23) vaccine. One dose is recommended after age 50. Talk to your health care provider about which screenings and vaccines you need and how often you need them. This information is not intended to replace advice given to you by your health care provider. Make sure you discuss any questions you have with your health care provider. Document Released: 03/02/2015 Document Revised: 10/24/2015 Document Reviewed: 12/05/2014 Elsevier Interactive Patient Education  2017 [place] Prevention in the Home Falls can cause injuries. They can happen to people of all ages. There are many things you can do to make your home safe and to help prevent falls. What can I do on the outside of my home?  Regularly fix the edges of walkways and driveways and fix any cracks.  Remove anything that might make you trip as you walk through a door, such as a raised step or threshold.  Trim any bushes or trees on the path to your home.  Use bright outdoor lighting.  Clear any walking paths of anything that might make someone trip, such as rocks or tools.  Regularly check to see if handrails are loose or broken. Make sure that both sides of any steps have handrails.  Any raised decks and porches should have guardrails on the edges.  Have any leaves, snow, or ice cleared regularly.  Use sand or salt on walking paths during winter.  Clean up any spills in your garage right away. This includes oil or grease spills. What can I do in the bathroom?  Use night lights.  Install grab bars by the toilet and in the tub and shower. Do not use towel bars as grab bars.  Use non-skid mats or decals in the tub or shower.  If you need to sit down in the shower, use a plastic, non-slip stool.  Keep the floor dry. Clean up any  water that spills on the floor as soon as it happens.  Remove soap buildup in the tub or shower regularly.  Attach bath mats securely with double-sided non-slip rug tape.  Do not have throw rugs and other things on the floor that can make you trip. What can I do in the bedroom?  Use night lights.  Make sure that you have a light by your bed that is easy to reach.  Do not use any sheets or blankets that are too big for your bed. They should not hang down onto the floor.  Have a firm chair that has side arms. You can use this for support while you get dressed.  Do not have throw rugs and other things on the floor that can make you trip. What can I do in the kitchen?  Clean up any spills right away.  Avoid walking on wet floors.  Keep items that you use a lot in easy-to-reach places.  If you need to reach something above you, use a strong step stool that has a grab bar.  Keep electrical cords out of the way.  Do not use floor polish or wax that makes floors slippery.  If you must use wax, use non-skid floor wax.  Do not have throw rugs and other things on the floor that can make you trip. What can I do with my stairs?  Do not leave any items on the stairs.  Make sure that there are handrails on both sides of the stairs and use them. Fix handrails that are broken or loose. Make sure that handrails are as long as the stairways.  Check any carpeting to make sure that it is firmly attached to the stairs. Fix any carpet that is loose or worn.  Avoid having throw rugs at the top or bottom of the stairs. If you do have throw rugs, attach them to the floor with carpet tape.  Make sure that you have a light switch at the top of the stairs and the bottom of the stairs. If you do not have them, ask someone to add them for you. What else can I do to help prevent falls?  Wear shoes that:  Do not have high heels.  Have rubber bottoms.  Are comfortable and fit you well.  Are closed  at the toe. Do not wear sandals.  If you use a stepladder:  Make sure that it is fully opened. Do not climb a closed stepladder.  Make sure that both sides of the stepladder are locked into place.  Ask someone to hold it for you, if possible.  Clearly mark and make sure that you can see:  Any grab bars or handrails.  First and last steps.  Where the edge of each step is.  Use tools that help you move around (mobility aids) if they are needed. These include:  Canes.  Walkers.  Scooters.  Crutches.  Turn on the lights when you go into a dark area. Replace any light bulbs as soon as they burn out.  Set up your furniture so you have a clear path. Avoid moving your furniture around.  If any of your floors are uneven, fix them.  If there are any pets around you, be aware of where they are.  Review your medicines with your doctor. Some medicines can make you feel dizzy. This can increase your chance of falling. Ask your doctor what other things that you can do to help prevent falls. This information is not intended to replace advice given to you by your health care provider. Make sure you discuss any questions you have with your health care provider. Document Released: 11/30/2008 Document Revised: 07/12/2015 Document Reviewed: 03/10/2014 Elsevier Interactive Patient Education  2017 [employer].

## 2020-05-10 ENCOUNTER — Other Ambulatory Visit: Payer: Self-pay | Admitting: *Deleted

## 2020-05-10 DIAGNOSIS — I712 Thoracic aortic aneurysm, without rupture, unspecified: Secondary | ICD-10-CM

## 2020-05-30 ENCOUNTER — Ambulatory Visit
Admission: RE | Admit: 2020-05-30 | Discharge: 2020-05-30 | Disposition: A | Discharge status: Home / Self Care | Payer: Medicare Other | Source: Ambulatory Visit | Attending: Thoracic Surgery (Cardiothoracic Vascular Surgery) | Admitting: Thoracic Surgery (Cardiothoracic Vascular Surgery)

## 2020-05-30 ENCOUNTER — Other Ambulatory Visit: Payer: Self-pay

## 2020-05-30 DIAGNOSIS — I712 Thoracic aortic aneurysm, without rupture, unspecified: Secondary | ICD-10-CM

## 2020-05-30 MED ORDER — IOPAMIDOL (ISOVUE-370) INJECTION 76%
75.0000 mL | Freq: Once | INTRAVENOUS | Status: AC | PRN
  Administered 2020-05-30: 75 mL via INTRAVENOUS

## 2020-06-05 ENCOUNTER — Ambulatory Visit: Payer: Medicare Other | Admitting: Thoracic Surgery (Cardiothoracic Vascular Surgery)

## 2020-07-03 ENCOUNTER — Ambulatory Visit (INDEPENDENT_AMBULATORY_CARE_PROVIDER_SITE_OTHER): Payer: Medicare Other | Admitting: Thoracic Surgery (Cardiothoracic Vascular Surgery)

## 2020-07-03 ENCOUNTER — Encounter: Payer: Self-pay | Admitting: Thoracic Surgery (Cardiothoracic Vascular Surgery)

## 2020-07-03 ENCOUNTER — Other Ambulatory Visit: Payer: Self-pay

## 2020-07-03 VITALS — BP 138/92 | HR 87 | Resp 20 | Ht 72.0 in | Wt 230.0 lb

## 2020-07-03 DIAGNOSIS — I712 Thoracic aortic aneurysm, without rupture, unspecified: Secondary | ICD-10-CM

## 2020-07-03 DIAGNOSIS — I7123 Aneurysm of the descending thoracic aorta, without rupture: Secondary | ICD-10-CM

## 2020-07-03 NOTE — Progress Notes (Signed)
[address]       [place] [address]             [phone]     HPI: Mr. 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  These issues first came to attention when he [place].  He has been followed since then.  Last saw him in October.  He was doing well at that time.  He was still smoking.  He had slight enlargement of his abdominal aneurysm so he was referred to Dr. Donnetta Hutching.  He saw him back in November.  In the interim since his last visit he has been feeling well.  He still playing golf several times a week.  He denies chest pain, pressure, tightness, and shortness of breath.  He does not have claudication.  He quit smoking about 8 days ago.  He still having cravings but so far has been able to avoid cigarettes.  Past Medical History:  Diagnosis Date  . Abdominal aortic aneurysm (AAA) ([facility]) 02/02/2018  . Aneurysm, aortic ([facility])   . Basal cell carcinoma 2016  . CHF (congestive heart failure) ([facility])   . Diverticulitis   . DVT (deep venous thrombosis) ([facility])   . Glaucoma   . Hypercholesteremia   . Thoracic aortic atherosclerosis ([facility]) 02/02/2018  . Wears dentures    partial upper    Current Outpatient Medications  Medication Sig Dispense Refill  . latanoprost (XALATAN) 0.005 % ophthalmic solution 1 drop at bedtime.    . simvastatin (ZOCOR) 20 MG tablet TAKE 1 TABLET DAILY 90 tablet 3   No current facility-administered medications for this visit.    Physical Exam BP (!) 138/92 (BP Location: Right Arm, Patient Position: Sitting)   Pulse 87   Resp 20   Ht 6' (1.829 m)   Wt 230 lb (104.3 kg)   SpO2 93% Comment: RA  BMI  31.82 kg/m  75 year old man in no acute distress Alert and oriented x3 with no focal deficits Lungs clear bilaterally Cardiac regular rate and rhythm, No murmur No peripheral edema Palpable distal pulses  Diagnostic Tests: CT ANGIOGRAPHY CHEST, ABDOMEN AND PELVIS  TECHNIQUE: Multidetector CT imaging through the chest, abdomen and pelvis was performed using the standard protocol during bolus administration of intravenous contrast. Multiplanar reconstructed images and MIPs were obtained and reviewed to evaluate the vascular anatomy.  CONTRAST:  30mL ISOVUE-370 IOPAMIDOL (ISOVUE-370) INJECTION 76%  COMPARISON:  11/30/2019  FINDINGS: CTA CHEST FINDINGS  Cardiovascular: Ascending thoracic aorta measures 3.6 cm and stable. Typical three-vessel arch anatomy. The great vessels are patent. Again noted is aneurysmal dilatation of the aortic isthmus just distal to the left subclavian artery. Maximum size of the aortic isthmus aneurysm is 5.1 cm on the sagittal images, sequence 8, image 127 and this is stable from the previous examination. The aortic isthmus measures up to 4.7 cm on the axial images on sequence 4, image 44 and this is similar to the previous examination. Again noted is irregular mural thrombus in the descending thoracic aorta with areas of ulcerative plaque. Distal descending thoracic aorta measures 3.5 cm in AP dimension on sequence 8, image 122 and this is stable. Irregular ulcerative plaque involving the anterior distal  descending thoracic aorta is similar to the previous examination. There is concern for a penetrating ulcer along the left side of the distal descending thoracic aorta on sequence 4, image 113. This penetrating ulcer measures up to 1.5 cm and unchanged. Heart size is normal. No large filling defects within the pulmonary arteries.  Mediastinum/Nodes: No chest lymphadenopathy. Esophagus is unremarkable.  Lungs/Pleura: Trachea and mainstem  bronchi are patent. No large pleural effusions. Centrilobular emphysema in the upper lungs. Scattered vague ground-glass densities in both lungs. Scattered punctate nodular and branching densities throughout both lungs. These findings are similar to the prior examination and likely related to post inflammatory changes. No large areas of airspace disease or consolidation. No pleural effusions.  Musculoskeletal: No acute bone abnormality.  Review of the MIP images confirms the above findings.  CTA ABDOMEN AND PELVIS FINDINGS  VASCULAR  Aorta: Atherosclerotic plaque involving the abdominal aorta. Proximal suprarenal abdominal aorta at the level of the celiac trunk measures 3.2 cm in the AP dimension and stable. Proximal infrarenal abdominal aortic aneurysm measures 4.3 cm and stable. Distal infrarenal abdominal aortic aneurysm measures up to 3.8 cm and previously measured 3.6 cm. Increased mural thrombus along the right side of the distal abdominal aortic aneurysm.  Celiac: Patent without evidence of aneurysm, dissection, vasculitis or significant stenosis.  SMA: Patent without evidence of aneurysm, dissection, vasculitis or significant stenosis.  Renals: Two right renal arteries are patent. Single left renal artery is widely patent. No evidence for aneurysm, dissection or significant stenosis involving the renal arteries.  IMA: Patent.  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 Right internal and right external iliac arteries are patent. Left common iliac artery measures up to 2.3 cm and stable. Left internal and left external iliac arteries are patent. Stable stenosis at the origin of the left internal iliac artery.  Veins: IVC and renal veins are patent. Main portal venous system is patent.  Review of the MIP images confirms the above findings.  NON-VASCULAR  Hepatobiliary: Normal  appearance of the liver and gallbladder. No biliary dilatation.  Pancreas: Punctate calcifications throughout the pancreas are suggestive for old inflammation. No acute inflammation or duct dilatation.  Spleen: Normal in size without focal abnormality.  Adrenals/Urinary Tract: Normal appearance of both kidneys without hydronephrosis. Normal appearance of the adrenal glands. Normal appearance of the urinary bladder.  Stomach/Bowel: Extensive diverticulosis involving the sigmoid colon without acute inflammation. Normal appendix. Normal stomach.  Lymphatic: No abdominal or pelvic lymph node enlargement.  Reproductive: Stable appearance of the prostate with calcifications.  Other: Negative for ascites.  Musculoskeletal: No acute bone abnormality.  Review of the MIP images confirms the above findings.  IMPRESSION: 1. Stable aneurysmal disease involving the aortic isthmus just beyond the left subclavian artery. Maximum dimension of the aortic isthmus is 5.1 cm based on the sagittal images and this is unchanged since 11/30/2019. Again noted is irregular mural thrombus involving the aortic isthmus and throughout the descending thoracic aorta. Stable penetrating ulcer and ulcerative plaques involving the descending thoracic aorta. 2. Abdominal aortic aneurysm. The proximal aspect of the infrarenal abdominal aortic aneurysm is stable in size measuring 4.3 cm. The distal aspect of the infrarenal abdominal aortic aneurysm has minimally enlarged measuring up to 3.8 cm. 3. 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 4.  Aortic Atherosclerosis (ICD10-I70.0). 5. Stable lung findings with emphysema and  post inflammatory changes.   Electronically Signed   By: Markus Daft M.D.   On: 05/30/2020 15:26 I personally reviewed the CT images.  There is been no change in his thoracic aortic  atherosclerosis and thoracic aneurysms.  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  Thoracic aortic atherosclerosis/penetrating ulcer/descending thoracic aneurysms-findings on CT are stable.  Has severe atherosclerotic disease.  He is on a statin.  Needs continued semiannual follow-up.  Importance of blood pressure control and tobacco cessation emphasized.  His blood pressure was mildly elevated today.  He has a cuff at home but has not been using it.  I recommended that he checked it on a regular basis.  Abdominal aneurysm-will be seen by Dr. Donnetta Hutching in the near future.  He will call and schedule an appointment with him.  Tobacco abuse-he has not smoked in 8 days.  I congratulated him for that.  He is trying his best to quit.   Plan: Follow-up with Dr. Donnetta Hutching Return in 6 months with CT angiogram of chest abdomen pelvis  I spent over 20 minutes in review of records, images, and in consultation with Mr. Friedlander today. [name], MD [facility] [phone]

## 2020-08-22 ENCOUNTER — Ambulatory Visit (INDEPENDENT_AMBULATORY_CARE_PROVIDER_SITE_OTHER): Payer: Medicare Other | Admitting: Vascular Surgery

## 2020-08-22 ENCOUNTER — Other Ambulatory Visit: Payer: Self-pay

## 2020-08-22 ENCOUNTER — Encounter: Payer: Self-pay | Admitting: Vascular Surgery

## 2020-08-22 VITALS — BP 143/88 | HR 64 | Temp 98.2°F | Ht 72.0 in | Wt 228.4 lb

## 2020-08-22 DIAGNOSIS — I714 Abdominal aortic aneurysm, without rupture, unspecified: Secondary | ICD-10-CM

## 2020-08-22 NOTE — Progress Notes (Signed)
[facility]  Patient name: Mark Hooper MRN: [id number] DOB: Jul 03, 1945 Sex: male  REASON FOR VISIT: Follow-up known aneurysmal disease  HPI: Mark Hooper is a 75 y.o. male here today for follow-up.  He has Diffuse aneurysms of his thoracic aorta, abdominal and iliac vessels.  He had a recent visit with Dr. Roxan Hockey regarding his thoracic aneurysm and is scheduled for follow-up of this in 6 months.  He has no symptoms referable to his aneurysm.  He is quite active playing golf 4 days a week.  Past Medical History:  Diagnosis Date   Abdominal aortic aneurysm (AAA) ([facility]) 02/02/2018   Aneurysm, aortic ([facility])    Basal cell carcinoma 2016   CHF (congestive heart failure) ([facility])    Diverticulitis    DVT (deep venous thrombosis) ([facility])    Glaucoma    Hypercholesteremia    Thoracic aortic atherosclerosis ([facility]) 02/02/2018   Wears dentures    partial upper    Family History  Problem Relation Age of Onset   Hyperlipidemia Father    CAD Father    Prostate cancer Neg Hx    Bladder Cancer Neg Hx    Kidney cancer Neg Hx     SOCIAL HISTORY: Social History   Tobacco Use   Smoking status: Every Day    Packs/day: 0.50    Years: 55.00    Pack years: 27.50    Types: Cigarettes   Smokeless tobacco: Never   Tobacco comments:    smoking cessation information given  Substance Use Topics   Alcohol use: Yes    Alcohol/week: 5.0 standard drinks    Types: 1 Shots of liquor, 4 Standard drinks or equivalent per week    Comment: rare    No Known Allergies  Current Outpatient Medications  Medication Sig Dispense Refill   latanoprost (XALATAN) 0.005 % ophthalmic solution 1 drop at bedtime.     simvastatin (ZOCOR) 20 MG tablet TAKE 1 TABLET DAILY 90 tablet 3   No current facility-administered medications for this visit.    REVIEW OF SYSTEMS:  [X]  denotes positive finding, [ ]  denotes negative finding Cardiac   Comments:  Chest pain or chest pressure:    Shortness of breath upon exertion:    Short of breath when lying flat:    Irregular heart rhythm:        Vascular    Pain in calf, thigh, or hip brought on by ambulation:    Pain in feet at night that wakes you up from your sleep:     Blood clot in your veins:    Leg swelling:           PHYSICAL EXAM: Vitals:   08/22/20 1108  BP: (!) 143/88  Pulse: 64  Temp: 98.2 F (36.8 C)  TempSrc: Temporal  SpO2: 96%  Weight: 228 lb 6.4 oz (103.6 kg)  Height: 6' (1.829 m)    GENERAL: The patient is a well-nourished male, in no acute distress. The vital signs are documented above. CARDIOVASCULAR: 2+ radial pulses bilaterally.  2+ femoral pulses bilaterally.  I do not palpate an aneurysm PULMONARY: There is good air exchange  MUSCULOSKELETAL: There are no major deformities or cyanosis. NEUROLOGIC: No focal weakness or paresthesias are detected. SKIN: There are no ulcers or rashes noted. PSYCHIATRIC: The patient has a normal affect.  DATA:  CT scan from 4/22 was reviewed with the patient.  This was compared to CT scan in October 2021.  He  has had no increase in size of his abdominal aortic aneurysm.  Maximal diameter by radiologist measurement currently is 4.3.  This is slightly smaller than the measurement from October 2021 at 4.7.  His right common iliac artery is 3.4 cm also down slightly from the measurement obtained in October 2021  MEDICAL ISSUES: I again discussed these findings with patient.  Discussed open and stent graft repair of aneurysm.  I feel it was appropriate to continue with observation and would recommend treatment if he has any progression.  We will see him again in 6 months after his next CT angio of the chest abdomen and pelvis    Rosetta Posner, MD FACS [facility] [phone]  Note: Portions of this report may have been transcribed using voice recognition software.  Every  effort has been made to ensure accuracy; however, inadvertent computerized transcription errors may still be present.

## 2020-10-10 ENCOUNTER — Other Ambulatory Visit: Payer: Self-pay | Admitting: Internal Medicine

## 2020-11-21 ENCOUNTER — Other Ambulatory Visit: Payer: Self-pay | Admitting: *Deleted

## 2020-11-21 DIAGNOSIS — I7121 Aneurysm of the ascending aorta, without rupture: Secondary | ICD-10-CM

## 2020-11-21 DIAGNOSIS — I714 Abdominal aortic aneurysm, without rupture, unspecified: Secondary | ICD-10-CM

## 2020-12-16 ENCOUNTER — Other Ambulatory Visit: Payer: Self-pay | Admitting: Internal Medicine

## 2020-12-16 NOTE — Telephone Encounter (Signed)
Last RF 10/10/20 #90 requested too soon

## 2020-12-31 ENCOUNTER — Other Ambulatory Visit: Payer: Self-pay

## 2020-12-31 ENCOUNTER — Ambulatory Visit
Admission: RE | Admit: 2020-12-31 | Discharge: 2020-12-31 | Disposition: A | Discharge status: Home / Self Care | Payer: Medicare Other | Source: Ambulatory Visit | Attending: Thoracic Surgery (Cardiothoracic Vascular Surgery) | Admitting: Thoracic Surgery (Cardiothoracic Vascular Surgery)

## 2020-12-31 ENCOUNTER — Ambulatory Visit (INDEPENDENT_AMBULATORY_CARE_PROVIDER_SITE_OTHER): Payer: Medicare Other | Admitting: Physician Assistant

## 2020-12-31 VITALS — BP 131/77 | HR 91 | Resp 20 | Ht 69.5 in | Wt 230.4 lb

## 2020-12-31 DIAGNOSIS — R0989 Other specified symptoms and signs involving the circulatory and respiratory systems: Secondary | ICD-10-CM | POA: Diagnosis not present

## 2020-12-31 DIAGNOSIS — I712 Thoracic aortic aneurysm, without rupture, unspecified: Secondary | ICD-10-CM | POA: Insufficient documentation

## 2020-12-31 DIAGNOSIS — I714 Abdominal aortic aneurysm, without rupture, unspecified: Secondary | ICD-10-CM

## 2020-12-31 DIAGNOSIS — I7121 Aneurysm of the ascending aorta, without rupture: Secondary | ICD-10-CM

## 2020-12-31 MED ORDER — IOPAMIDOL (ISOVUE-370) INJECTION 76%
75.0000 mL | Freq: Once | INTRAVENOUS | Status: AC | PRN
  Administered 2020-12-31: 75 mL via INTRAVENOUS

## 2020-12-31 NOTE — Patient Instructions (Addendum)
Patient is counseled regarding the importance of long term risk factor modification as they pertain to the presence of Aortic Disease including avoiding the use of all tobacco products, dietary modifications and medical therapy for diabetes, cholesterol and lipid management.  Do not resume smoking cigarettes or any other tobacco products.  Make every effort to maintain a "heart-healthy" lifestyle with regular physical exercise and adherence to a low-fat, low-carbohydrate diet.  Continue to seek regular follow-up appointments with your primary care physician and/or cardiologist.

## 2020-12-31 NOTE — Progress Notes (Signed)
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   These issues first came to attention when he had a syncopal spell while [place] in 2019.  He has been followed since that time.  He was last evaluated by Dr. Roxan Hockey on 07/02/2020 at which time he continued to be doing well.  He remained active playing golf several times per day.  He had also quit smoking.  His thoracic aneurysm was stable at that time.  He was also noted to have a descending aneurysm and is followed by Dr. Donnetta Hutching.  The patient presents today for 6 month follow up.  The patient continues to be nicotine free.  He states he hasn't smoked in 6 months.  He continues to play golf 4-5 times per week.  He notes that he can completed 18 holes, however is worn out by the time he gets home.  He states this is a change for him.  He is also noticing some shortness of breath.  He also mentions some sharp pains up along the right side of his neck.  He denies chest pain, but does state he gets some pain under his left scapula.   Current Outpatient Medications on File Prior to Visit  Medication Sig Dispense Refill   latanoprost (XALATAN) 0.005 % ophthalmic solution 1 drop at bedtime.     simvastatin (ZOCOR) 20 MG tablet TAKE 1 TABLET DAILY 90 tablet 0   No current facility-administered medications on file prior to visit.   BP 131/77 (BP Location: Right Arm, Patient Position: Sitting, Cuff Size: Large)   Pulse 91   Resp 20   Ht 5' 9.5" (1.765 m)   Wt 230 lb 6.4 oz (104.5 kg)   SpO2 95% Comment: RA  BMI 33.54 kg/m   Physical Exam  Gen: no apparent  distress Neck: Right sided Carotid Bruit Heart: RRR Lungs: CTA bilaterally Ext: no edema Neuro: grossly intact  CTA Results: IMPRESSION: CT angiogram of chest, abdomen, pelvis again demonstrates aneurysm disease, including:   -aneurysm of proximal descending thoracic aorta, estimated 5.1 cm on sagittal images, unchanged. Aortic aneurysm NOS (ICD10-I71.9).   -aneurysm distal thoracic aorta at the T10-T11 level secondary to penetrating aortic ulcer, with greatest transverse diameter 4.2 cm, unchanged   -infrarenal abdominal aortic aneurysm, maximum dimension 4.4 cm just below the renal arteries, unchanged   -right common iliac artery aneurysm, estimated 4 cm on coronal images, unchanged   -ectasias a of left common iliac artery, unchanged, 2.2 cm   Atherosclerosis and coronary artery disease. Aortic Atherosclerosis (ICD10-I70.0).   Changes of centrilobular emphysema, with geographic ground-glass of the lower lobes most compatible with small airway disease. Emphysema (ICD10-J43.9).   Additional ancillary findings as above.  A/P:  Mark Hooper has a known Thoracic aortic atherosclerosis/penetrating ulcer/descending thoracic aneurysms.  These overall remain stable on CTA CAP from today.  He does however have a new complaint of sharp pain along the right side of his neck.  He was noted to have a bruit on exam.  The patient states he was never told  he had this prior nor has he had an ultrasound of his neck to his knowledge.  He also notes a change in his energy level after playing golf.  This mainly consists of fatigue which he states wasn't present 6 months ago.  He is also experiencing some shortness of breath and subscapular pain.  Patient commended on remaining smoke free.   Right Carotid Bruit- new on my exam and per patient, will obtain a carotid duplex.  He is due to follow up with Dr. Donnetta Hutching for his descending aortic aneurysm and I have asked our office to arrange this follow  up once Duplex is obtained Fatigue- this is new for patient as he plays golf several times per week and is associated with shortness of breath.  He has no atherosclerotic disease and COPD, but this could also be related to undiagnosed CAD.  I have referred him to Cardiology for evaluation to ensure underlying CAD is not the source of his new symptoms Plan- medical workup as above for new symptoms complaints.. will also have patient RTC in 6 months with repeat CTA CAP with Dr. Roxan Hockey  Risk Modification:  Statin:  Yes  Smoking cessation instruction/counseling given:  commended patient for quitting and reviewed strategies for preventing relapses  Patient was counseled on importance of Blood Pressure Control.  Despite Medical intervention if the patient notices persistently elevated blood pressure readings.  They are instructed to contact their Primary Care Physician  Please avoid use of Fluoroquinolones as this can potentially increase your risk of Aortic Rupture and/or Dissection  Patient educated on signs and symptoms of Aortic Dissection, handout also provided in AVS  Mark Vaughan, PA-C 12/31/20

## 2021-01-02 ENCOUNTER — Ambulatory Visit
Admission: RE | Admit: 2021-01-02 | Discharge: 2021-01-02 | Disposition: A | Discharge status: Home / Self Care | Payer: Medicare Other | Source: Ambulatory Visit | Attending: Physician Assistant | Admitting: Physician Assistant

## 2021-01-02 ENCOUNTER — Other Ambulatory Visit: Payer: Self-pay

## 2021-01-02 DIAGNOSIS — R0989 Other specified symptoms and signs involving the circulatory and respiratory systems: Secondary | ICD-10-CM | POA: Insufficient documentation

## 2021-01-04 ENCOUNTER — Other Ambulatory Visit
Admission: RE | Admit: 2021-01-04 | Discharge: 2021-01-04 | Disposition: A | Discharge status: Home / Self Care | Payer: Medicare Other | Attending: Internal Medicine | Admitting: Internal Medicine

## 2021-01-04 ENCOUNTER — Encounter: Payer: Self-pay | Admitting: Internal Medicine

## 2021-01-04 ENCOUNTER — Other Ambulatory Visit: Payer: Self-pay

## 2021-01-04 ENCOUNTER — Ambulatory Visit (INDEPENDENT_AMBULATORY_CARE_PROVIDER_SITE_OTHER): Payer: Medicare Other | Admitting: Internal Medicine

## 2021-01-04 VITALS — BP 154/90 | HR 63 | Ht 72.0 in | Wt 234.0 lb

## 2021-01-04 DIAGNOSIS — I712 Thoracic aortic aneurysm, without rupture, unspecified: Secondary | ICD-10-CM | POA: Diagnosis present

## 2021-01-04 DIAGNOSIS — R5383 Other fatigue: Secondary | ICD-10-CM | POA: Diagnosis present

## 2021-01-04 DIAGNOSIS — R0609 Other forms of dyspnea: Secondary | ICD-10-CM | POA: Diagnosis not present

## 2021-01-04 DIAGNOSIS — Z01812 Encounter for preprocedural laboratory examination: Secondary | ICD-10-CM

## 2021-01-04 DIAGNOSIS — R072 Precordial pain: Secondary | ICD-10-CM | POA: Insufficient documentation

## 2021-01-04 DIAGNOSIS — R0602 Shortness of breath: Secondary | ICD-10-CM | POA: Insufficient documentation

## 2021-01-04 DIAGNOSIS — R079 Chest pain, unspecified: Secondary | ICD-10-CM

## 2021-01-04 DIAGNOSIS — R030 Elevated blood-pressure reading, without diagnosis of hypertension: Secondary | ICD-10-CM

## 2021-01-04 DIAGNOSIS — I714 Abdominal aortic aneurysm, without rupture, unspecified: Secondary | ICD-10-CM

## 2021-01-04 LAB — BASIC METABOLIC PANEL
Anion gap: 4 — ABNORMAL LOW (ref 5–15)
BUN: 16 mg/dL (ref 8–23)
CO2: 27 mmol/L (ref 22–32)
Calcium: 9.7 mg/dL (ref 8.9–10.3)
Chloride: 107 mmol/L (ref 98–111)
Creatinine, Ser: 1.01 mg/dL (ref 0.61–1.24)
GFR, Estimated: >60 mL/min (ref >60)
Glucose, Bld: 116 mg/dL — ABNORMAL HIGH (ref 70–99)
Potassium: 4.9 mmol/L (ref 3.5–5.1)
Sodium: 138 mmol/L (ref 135–145)

## 2021-01-04 MED ORDER — ASPIRIN EC 81 MG PO TBEC: 81.0000 mg | DELAYED_RELEASE_TABLET | Freq: Every day | ORAL | Status: AC

## 2021-01-04 MED ORDER — METOPROLOL TARTRATE 50 MG PO TABS: 50.0000 mg | ORAL_TABLET | Freq: Once | ORAL | 0 refills | Status: DC

## 2021-01-04 NOTE — Progress Notes (Signed)
New Outpatient Visit Date: 01/04/2021  Referring Provider: Ellwood Handler, PA [facility]  Chief Complaint: Fatigue, shortness of breath, and chest pain  HPI:  Mark Hooper is a 75 y.o. male who is being seen today for the evaluation of shortness of breath, fatigue, and left scapular pain at the request of Ms. Hooper. He has a history of thoracic and abdominal aortic aneurysms followed by Drs. Hendrickson and Early as well as hyperlipidemia.  His chart also indicates a history of CHF and DVT, though Mark Hooper does not recall ever having been given these diagnoses.  Over the last month, Mark Hooper has noticed increasing fatigue especially after playing a round of golf.  He typically plays 5 days a week and previously did not have any limitations.  He also notices some exertional dyspnea, especially when walking uphill or going up stairs.  This is new over the last month.  He occasionally feels a sharp pain in the chest, predominantly in the upper back behind the left shoulder blade.  It seems to come on randomly and only lasts about 30 seconds at a time.  He denies palpitations, lightheadedness, and edema.  His syncopal event in 2019 that led to incidental finding of his aortic aneurysms occurred while he was waiting in line at the airport in [place].  He attributes his syncope to not having had anything to eat or drink.  He has not had any further episodes.  He denies prior ischemia evaluation.  He recently underwent carotid Doppler for evaluation of neck pain, which showed less than 50% carotid stenoses bilaterally.  --------------------------------------------------------------------------------------------------  Cardiovascular History & Procedures: Cardiovascular Problems: Thoracic and abdominal aortic aneurysms with penetrating ulcer in the descending aorta Fatigue and dyspnea on exertion Coronary artery calcification  Risk Factors: PAD,  hyperlipidemia, male gender, age greater than 37, prior tobacco use, and obesity  Cath/PCI: None  CV Surgery: None  EP Procedures and Devices: None  Non-Invasive Evaluation(s): TTE (10/12/2017, [facility], [place]): Normal LV size with mild LVH.  LVEF 60-65% with normal wall motion and grade 1 diastolic dysfunction.  Normal RV size and function.  Mildly dilated aortic root.  No significant valvular abnormality.  Recent CV Pertinent Labs: Lab Results  Component Value Date   CHOL 155 01/18/2020   HDL 50 01/18/2020   LDLCALC 81 01/18/2020   TRIG 140 01/18/2020   CHOLHDL 3.1 01/18/2020   INR 1.1 08/11/2013   K 4.6 01/18/2020   K 3.3 (L) 08/11/2013   MG 2.1 08/11/2013   BUN 15 01/18/2020   BUN 8 08/11/2013   CREATININE 0.92 01/18/2020   CREATININE 0.83 08/11/2013    --------------------------------------------------------------------------------------------------  Past Medical History:  Diagnosis Date   Abdominal aortic aneurysm (AAA) 02/02/2018   Aneurysm, aortic (HCC)    Basal cell carcinoma 2016   CHF (congestive heart failure) (HCC)    Diverticulitis    DVT (deep venous thrombosis) (HCC)    Glaucoma    Hypercholesteremia    Thoracic aortic atherosclerosis ([facility]) 02/02/2018   Wears dentures    partial upper    Past Surgical History:  Procedure Laterality Date   basal cell cancer excision     CATARACT EXTRACTION     COLONOSCOPY     COLONOSCOPY WITH PROPOFOL N/A 10/27/2014   Procedure: COLONOSCOPY WITH PROPOFOL;  Surgeon: Lucilla Lame, MD;  Location: [facility];  Service: Endoscopy;  Laterality: N/A;   COLONOSCOPY WITH PROPOFOL N/A 11/25/2019   Procedure: COLONOSCOPY  WITH PROPOFOL;  Surgeon: Lucilla Lame, MD;  Location: [facility];  Service: Endoscopy;  Laterality: N/A;  priority 4   POLYPECTOMY  10/27/2014   Procedure: POLYPECTOMY;  Surgeon: Lucilla Lame, MD;  Location: [facility];  Service: Endoscopy;;   POLYPECTOMY  11/25/2019    Procedure: POLYPECTOMY;  Surgeon: Lucilla Lame, MD;  Location: [facility];  Service: Endoscopy;;    Current Meds  Medication Sig   latanoprost (XALATAN) 0.005 % ophthalmic solution 1 drop at bedtime.   simvastatin (ZOCOR) 20 MG tablet TAKE 1 TABLET DAILY    Allergies: Patient has no known allergies.  Social History   Tobacco Use   Smoking status: Former    Packs/day: 0.50    Years: 55.00    Pack years: 27.50    Types: Cigarettes    Quit date: 07/01/2020    Years since quitting: 0.5   Smokeless tobacco: Never   Tobacco comments:    smoking cessation information given  Vaping Use   Vaping Use: Never used  Substance Use Topics   Alcohol use: Yes    Alcohol/week: 5.0 standard drinks    Types: 1 Shots of liquor, 4 Standard drinks or equivalent per week    Comment: rare   Drug use: Never    Family History  Problem Relation Age of Onset   Heart disease Father        CABG x 3   Hyperlipidemia Father    CAD Father    Prostate cancer Neg Hx    Bladder Cancer Neg Hx    Kidney cancer Neg Hx     Review of Systems: A 12-system review of systems was performed and was negative except as noted in the HPI.  --------------------------------------------------------------------------------------------------  Physical Exam: BP (!) 154/90 (BP Location: Right Arm, Patient Position: Sitting, Cuff Size: Normal)   Pulse 63   Ht 6' (1.829 m)   Wt 234 lb (106.1 kg)   SpO2 99%   BMI 31.74 kg/m   General: NAD. HEENT: No conjunctival pallor or scleral icterus. Facemask in place. Neck: Supple without lymphadenopathy, thyromegaly, JVD, or HJR. No carotid bruit. Lungs: Normal work of breathing. Clear to auscultation bilaterally without wheezes or crackles. Heart: Regular rate and rhythm without murmurs, rubs, or gallops. Non-displaced PMI. Abd: Bowel sounds present. Soft, NT/ND without hepatosplenomegaly Ext: No lower extremity edema. Radial, PT, and DP pulses are 2+  bilaterally Skin: Warm and dry without rash. Neuro: CNIII-XII intact. Strength and fine-touch sensation intact in upper and lower extremities bilaterally. Psych: Normal mood and affect.  EKG: Normal sinus rhythm without abnormalities.  Lab Results  Component Value Date   WBC 9.9 01/18/2020   HGB 18.0 (H) 01/18/2020   HCT 52.7 (H) 01/18/2020   MCV 95 01/18/2020   PLT 256 01/18/2020    Lab Results  Component Value Date   NA 139 01/18/2020   K 4.6 01/18/2020   CL 100 01/18/2020   CO2 21 01/18/2020   BUN 15 01/18/2020   CREATININE 0.92 01/18/2020   GLUCOSE 96 01/18/2020   ALT 12 01/18/2020    Lab Results  Component Value Date   CHOL 155 01/18/2020   HDL 50 01/18/2020   LDLCALC 81 01/18/2020   TRIG 140 01/18/2020   CHOLHDL 3.1 01/18/2020     --------------------------------------------------------------------------------------------------  ASSESSMENT AND PLAN: Fatigue, dyspnea on exertion, and chest pain: Symptoms have been present for about a month and are nonspecific.  Physical exam and EKG today are unrevealing.  Given his  history of significant aortic disease, Mark Hooper is at risk for ischemic heart disease.  His recent CTA of the chest, abdomen, and pelvis showed evidence of coronary artery calcification.  I also question occlusion of the mid RCA, though this may be from motion artifact on this not gated study.  I have recommended repeating an echocardiogram (outside study in 2019 was largely unremarkable) and obtaining a dedicated coronary CTA.  We have agreed to start aspirin 81 mg daily as well in the setting of significant PAD.  Thoracic aortic aneurysm and abdominal aortic aneurysm: No significant symptoms reported though I wonder if sporadic posterior chest pain could be a manifestation of his aortic disease.  I advised Mark Hooper to call 911 if he has more persistent back pain, as this could be an indication of impending aortic rupture or dissection.  We have  agreed to add aspirin 81 mg daily.  Simvastatin can be continued for now, though statin escalation should be considered in the future given the last LDL was above goal at 81 in 01/2020.  Elevated blood pressure: Mark Hooper does not carry a diagnosis of hypertension but is noted to have moderately elevated blood pressure today.  Prior readings at other practices have been somewhat labile.  I have asked him to monitor his blood pressure at home.  If it is consistently above 140/90, pharmacotherapy will need to be instituted.  Follow-up: Return to clinic in 1 month.  Nelva Bush, MD 01/04/2021 8:52 AM

## 2021-01-04 NOTE — Patient Instructions (Signed)
Medication Instructions:   Your physician has recommended you make the following change in your medication:   START Aspirin 81 mg daily - you may purchase over-the-counter  *If you need a refill on your cardiac medications before your next appointment, please call your pharmacy*   Lab Work:  BMET today at the [facility] at [facility]  -  Please go to the [facility] will check in at the front desk to the right as you walk into the atrium.   If you have labs (blood work) drawn today and your tests are completely normal, you will receive your results only by: [place] (if you have MyChart) OR A paper copy in the mail If you have any lab test that is abnormal or we need to change your treatment, we will call you to review the results.   Testing/Procedures:  1) Your physician has requested that you have an echocardiogram. Echocardiography is a painless test that uses sound waves to create images of your heart. It provides your doctor with information about the size and shape of your heart and how well your heart's chambers and valves are working. This procedure takes approximately one hour. There are no restrictions for this procedure.  2) Your cardiac CT has been scheduled Thursday 01/17/21 at 1:15 PM at the below location:  [facility] [place], [place] [address] [phone]  Please arrive 15 mins early for check-in and test prep.  Please follow these instructions carefully (unless otherwise directed):  Hold all erectile dysfunction medications at least 3 days (72 hrs) prior to test.  On the Night Before the Test:  Be sure to Drink plenty of water. Do not consume any caffeinated/decaffeinated beverages or chocolate 12 hours prior to your test. Do not take any antihistamines 12 hours prior to your test.   On the Day of the Test:  Drink plenty of water until 1 hour prior to the test. Do not eat  any food 4 hours prior to the test. You may take your regular medications prior to the test.  Take metoprolol (Lopressor) two hours prior to test. HOLD Furosemide/Hydrochlorothiazide morning of the test.        After the Test: Drink plenty of water. After receiving IV contrast, you may experience a mild flushed feeling. This is normal. On occasion, you may experience a mild rash up to 24 hours after the test. This is not dangerous. If this occurs, you can take Benadryl 25 mg and increase your fluid intake. If you experience trouble breathing, this can be serious. If it is severe call 911 IMMEDIATELY. If it is mild, please call our office. If you take any of these medications: Glipizide/Metformin, Avandament, Glucavance, please do not take 48 hours after completing test unless otherwise instructed.  For non-scheduling related questions, please contact the cardiac imaging nurse navigator should you have any questions/concerns: Marchia Bond, Cardiac Imaging Nurse Navigator Gordy Clement, Cardiac Imaging Nurse Navigator [facility] Direct Office Dial: [phone]   For scheduling needs, including cancellations and rescheduling, please call [place], [phone].   Follow-Up: At [facility], you and your health needs are our priority.  As part of our continuing mission to provide you with exceptional heart care, we have created designated Provider Care Teams.  These Care Teams include your primary Cardiologist (physician) and Advanced Practice Providers (APPs -  Physician Assistants and Nurse Practitioners) who all work together to  provide you with the care you need, when you need it.  We recommend signing up for the patient portal called "MyChart".  Sign up information is provided on this After Visit Summary.  MyChart is used to connect with patients for Virtual Visits (Telemedicine).  Patients are able to view lab/test results, encounter notes, upcoming  appointments, etc.  Non-urgent messages can be sent to your provider as well.   To learn more about what you can do with MyChart, go to [email or web address].    Your next appointment:   1 month(s)  The format for your next appointment:   In Person  Provider:   You may see Dr. Harrell Gave End or one of the following Advanced Practice Providers on your designated Care Team:   Murray Hodgkins, NP Christell Faith, PA-C Cadence Kathlen Mody, PA-C}   Other Instructions  Please monitor your blood pressure at home.

## 2021-01-16 ENCOUNTER — Telehealth (HOSPITAL_COMMUNITY): Payer: Self-pay | Admitting: *Deleted

## 2021-01-16 ENCOUNTER — Telehealth (HOSPITAL_COMMUNITY): Payer: Self-pay | Admitting: Emergency Medicine

## 2021-01-16 NOTE — Telephone Encounter (Signed)
Reaching out to patient to offer assistance regarding upcoming cardiac imaging study; pt verbalizes understanding of appt date/time, parking situation and where to check in, pre-test NPO status and medications ordered, and verified current allergies; name and call back number provided for further questions should they arise  Gordy Clement RN Navigator Cardiac Imaging [facility] [phone] office [phone] cell  Patient to take 50mg  metoprolol tartrate two hours prior to cardiac CT scan.

## 2021-01-16 NOTE — Telephone Encounter (Signed)
the patient calling to clarify directions to [facility]  I gave him the address [address] [place], [place], [place] [facility] [phone] Office  [phone] Cell

## 2021-01-17 ENCOUNTER — Other Ambulatory Visit: Payer: Self-pay

## 2021-01-17 ENCOUNTER — Ambulatory Visit
Admission: RE | Admit: 2021-01-17 | Discharge: 2021-01-17 | Disposition: A | Discharge status: Home / Self Care | Payer: Medicare Other | Source: Ambulatory Visit | Attending: Internal Medicine | Admitting: Internal Medicine

## 2021-01-17 DIAGNOSIS — I2510 Atherosclerotic heart disease of native coronary artery without angina pectoris: Secondary | ICD-10-CM | POA: Insufficient documentation

## 2021-01-17 DIAGNOSIS — I7123 Aneurysm of the descending thoracic aorta, without rupture: Secondary | ICD-10-CM | POA: Diagnosis not present

## 2021-01-17 DIAGNOSIS — R0609 Other forms of dyspnea: Secondary | ICD-10-CM | POA: Insufficient documentation

## 2021-01-17 DIAGNOSIS — I712 Thoracic aortic aneurysm, without rupture, unspecified: Secondary | ICD-10-CM | POA: Insufficient documentation

## 2021-01-17 DIAGNOSIS — J439 Emphysema, unspecified: Secondary | ICD-10-CM | POA: Insufficient documentation

## 2021-01-17 DIAGNOSIS — R079 Chest pain, unspecified: Secondary | ICD-10-CM | POA: Diagnosis not present

## 2021-01-17 DIAGNOSIS — R5383 Other fatigue: Secondary | ICD-10-CM | POA: Insufficient documentation

## 2021-01-17 DIAGNOSIS — I700 Atherosclerosis of aorta: Secondary | ICD-10-CM | POA: Insufficient documentation

## 2021-01-17 MED ORDER — IOHEXOL 350 MG/ML SOLN
100.0000 mL | Freq: Once | INTRAVENOUS | Status: AC | PRN
  Administered 2021-01-17: 100 mL via INTRAVENOUS

## 2021-01-17 MED ORDER — NITROGLYCERIN 0.4 MG SL SUBL
0.8000 mg | SUBLINGUAL_TABLET | Freq: Once | SUBLINGUAL | Status: AC
  Administered 2021-01-17: 0.8 mg via SUBLINGUAL

## 2021-01-17 NOTE — Progress Notes (Signed)
Patient tolerated CT well. Drank water after. Vital signs stable encourage to drink water throughout day.Reasons explained and verbalized understanding. Ambulated steady gait.  

## 2021-01-21 ENCOUNTER — Other Ambulatory Visit: Payer: Self-pay

## 2021-01-21 ENCOUNTER — Ambulatory Visit (INDEPENDENT_AMBULATORY_CARE_PROVIDER_SITE_OTHER): Payer: Medicare Other | Admitting: Internal Medicine

## 2021-01-21 ENCOUNTER — Encounter: Payer: Self-pay | Admitting: Internal Medicine

## 2021-01-21 VITALS — BP 118/70 | HR 79 | Ht 72.0 in | Wt 230.6 lb

## 2021-01-21 DIAGNOSIS — I6523 Occlusion and stenosis of bilateral carotid arteries: Secondary | ICD-10-CM

## 2021-01-21 DIAGNOSIS — E782 Mixed hyperlipidemia: Secondary | ICD-10-CM

## 2021-01-21 DIAGNOSIS — R3914 Feeling of incomplete bladder emptying: Secondary | ICD-10-CM

## 2021-01-21 DIAGNOSIS — R5383 Other fatigue: Secondary | ICD-10-CM

## 2021-01-21 DIAGNOSIS — J432 Centrilobular emphysema: Secondary | ICD-10-CM

## 2021-01-21 DIAGNOSIS — R030 Elevated blood-pressure reading, without diagnosis of hypertension: Secondary | ICD-10-CM

## 2021-01-21 DIAGNOSIS — R7303 Prediabetes: Secondary | ICD-10-CM | POA: Diagnosis not present

## 2021-01-21 DIAGNOSIS — N401 Enlarged prostate with lower urinary tract symptoms: Secondary | ICD-10-CM | POA: Diagnosis not present

## 2021-01-21 MED ORDER — SIMVASTATIN 20 MG PO TABS
20.0000 mg | ORAL_TABLET | Freq: Every day | ORAL | 3 refills | Status: DC
Start: 2021-01-21 — End: 2021-03-22

## 2021-01-21 NOTE — Progress Notes (Signed)
Date:  01/21/2021   Name:  Mark Hooper   DOB:  05-14-1945   MRN:  [id number]   Chief Complaint: Annual Exam JONTHAN LEITE is a 75 y.o. male who presents today for his Complete Annual Exam. He feels well. He reports exercising / playing golf. He reports he is sleeping well.   Colonoscopy: 11/2019  Immunization History  Administered Date(s) Administered   Influenza, High Dose Seasonal PF 10/26/2017   Influenza-Unspecified 12/03/2018, 11/19/2019, 11/17/2020   PFIZER(Purple Top)SARS-COV-2 Vaccination 03/29/2019, 04/19/2019, 11/28/2019   Pneumococcal Conjugate-13 02/05/2015   Pneumococcal Polysaccharide-23 01/05/2012   Zoster Recombinat (Shingrix) 12/10/2018, 02/15/2019    Lab Results  Component Value Date   PSA1 2.4 01/18/2020   PSA1 2.0 12/10/2018   PSA1 4.1 (H) 10/26/2017   PSA 2.0 08/02/2013     Hyperlipidemia This is a chronic problem. The problem is controlled. Associated symptoms include chest pain (pain in right neck). Pertinent negatives include no myalgias or shortness of breath. Current antihyperlipidemic treatment includes statins. The current treatment provides significant improvement of lipids.  Chest Pain  This is a new (seen by Cardiology - stress test done) problem. Pertinent negatives include no abdominal pain, back pain, diaphoresis, dizziness, headaches, palpitations or shortness of breath.  His past medical history is significant for hyperlipidemia. Recent imaging shows stable aneurysms and mild centrilobular emphysema.  CT Angio: IMPRESSION: CT angiogram of chest, abdomen, pelvis again demonstrates aneurysm disease, including:   -aneurysm of proximal descending thoracic aorta, estimated 5.1 cm on sagittal images, unchanged. Aortic aneurysm NOS (ICD10-I71.9).   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 Aortic Atherosclerosis (ICD10-I70.0).   Changes of centrilobular emphysema, with geographic ground-glass of the lower lobes most compatible with small airway disease. Emphysema (ICD10-J43.9).    Lab Results  Component Value Date   NA 138 01/04/2021   K 4.9 01/04/2021   CO2 27 01/04/2021   GLUCOSE 116 (H) 01/04/2021   BUN 16 01/04/2021   CREATININE 1.01 01/04/2021   CALCIUM 9.7 01/04/2021   GFRNONAA >60 01/04/2021   Lab Results  Component Value Date   CHOL 155 01/18/2020   HDL 50 01/18/2020   LDLCALC 81 01/18/2020   TRIG 140 01/18/2020   CHOLHDL 3.1 01/18/2020   Lab Results  Component Value Date   TSH 2.20 08/02/2013   Lab Results  Component Value Date   HGBA1C 6.0 (H) 01/18/2020   Lab Results  Component Value Date   WBC 9.9 01/18/2020   HGB 18.0 (H) 01/18/2020   HCT 52.7 (H) 01/18/2020   MCV 95 01/18/2020   PLT 256 01/18/2020   Lab Results  Component Value Date   ALT 12 01/18/2020   AST 11 01/18/2020   ALKPHOS 80 01/18/2020   BILITOT 0.4 01/18/2020   No results found for: 25OHVITD2, 25OHVITD3, VD25OH   Review of Systems  Constitutional:  Positive for fatigue (after playing golf he has much less energy than usual). Negative for appetite change, chills, diaphoresis and unexpected weight change.  HENT:  Negative for hearing loss, tinnitus, trouble swallowing and voice change.   Eyes:  Negative for visual disturbance.  Respiratory:  Negative for choking, chest tightness, shortness of breath and wheezing.   Cardiovascular:  Positive for chest pain (pain in right neck). Negative for palpitations and leg swelling.  Gastrointestinal:  Negative for abdominal pain, blood in stool, constipation and diarrhea.   Genitourinary:  Positive for frequency (3-4 times at night). Negative for difficulty urinating and dysuria.  Musculoskeletal:  Negative for arthralgias, back pain and myalgias.  Skin:  Negative for color change and rash.  Neurological:  Negative for dizziness, syncope and headaches.  Hematological:  Negative for adenopathy.  Psychiatric/Behavioral:  Negative for dysphoric mood and sleep disturbance. The patient is not nervous/anxious.    Patient Active Problem List   Diagnosis Date Noted   Fatigue 01/04/2021   Dyspnea on exertion 01/04/2021   Chest pain 01/04/2021   Elevated blood pressure reading 01/04/2021   Thoracic aortic aneurysm without rupture 12/31/2020   Polyp of sigmoid colon    Rectal polyp    Pulmonary nodules/lesions, multiple 02/12/2018   Thoracic aortic atherosclerosis ([place]) 02/02/2018   Abdominal aortic aneurysm (AAA) without rupture 02/02/2018   Aortic dilatation ([place]) 10/14/2017   Benign prostatic hyperplasia with incomplete bladder emptying 02/05/2015   Hx of colonic polyps    Benign neoplasm of ascending colon    Benign neoplasm of descending colon    Benign neoplasm of sigmoid colon    Tobacco use disorder 09/01/2014   Basal cell carcinoma of nose 08/29/2014   Mixed hyperlipidemia 08/29/2014   Plantar fasciitis 08/29/2014   History of malignant melanoma of skin 09/08/2013   Hx of abdominal abscess 07/29/2013    No Known Allergies  Past Surgical History:  Procedure Laterality Date   basal cell cancer excision     CATARACT EXTRACTION     COLONOSCOPY     COLONOSCOPY WITH PROPOFOL N/A 10/27/2014   Procedure: COLONOSCOPY WITH PROPOFOL;  Surgeon: Lucilla Lame, MD;  Location: [place];  Service: Endoscopy;  Laterality: N/A;   COLONOSCOPY WITH PROPOFOL N/A 11/25/2019   Procedure: COLONOSCOPY WITH PROPOFOL;  Surgeon: Lucilla Lame, MD;  Location: [place];  Service: Endoscopy;  Laterality: N/A;  priority 4   POLYPECTOMY  10/27/2014    Procedure: POLYPECTOMY;  Surgeon: Lucilla Lame, MD;  Location: [place];  Service: Endoscopy;;   POLYPECTOMY  11/25/2019   Procedure: POLYPECTOMY;  Surgeon: Lucilla Lame, MD;  Location: [place];  Service: Endoscopy;;    Social History   Tobacco Use   Smoking status: Former    Packs/day: 1.00    Years: 55.00    Pack years: 55.00    Types: Cigarettes    Quit date: 07/01/2020    Years since quitting: 0.5   Smokeless tobacco: Never   Tobacco comments:    smoking cessation information given  Vaping Use   Vaping Use: Never used  Substance Use Topics   Alcohol use: Not Currently   Drug use: Never     Medication list has been reviewed and updated.  Current Meds  Medication Sig   aspirin EC 81 MG tablet Take 1 tablet (81 mg total) by mouth daily. Swallow whole.   latanoprost (XALATAN) 0.005 % ophthalmic solution 1 drop at bedtime.   metoprolol tartrate (LOPRESSOR) 50 MG tablet Take 1 tablet (50 mg total) by mouth once for 1 dose. Take TWO hours prior to CT procedure   simvastatin (ZOCOR) 20 MG tablet TAKE 1 TABLET DAILY    PHQ 2/9 Scores 01/21/2021 08/22/2020 04/18/2020 01/18/2020  PHQ - 2 Score 0 0 0  0  PHQ- 9 Score 0 - - 0    GAD 7 : Generalized Anxiety Score 01/21/2021 01/18/2020 05/30/2019  Nervous, Anxious, on Edge 0 0 0  Control/stop worrying 0 0 0  Worry too much - different things 0 0 0  Trouble relaxing 0 0 0  Restless 0 0 0  Easily annoyed or irritable 0 0 0  Afraid - awful might happen 0 0 0  Total GAD 7 Score 0 0 0  Anxiety Difficulty Not difficult at all - Not difficult at all    BP Readings from Last 3 Encounters:  01/21/21 118/70  01/17/21 114/71  01/04/21 (!) 154/90    Physical Exam Vitals and nursing note reviewed.  Constitutional:      Appearance: Normal appearance. He is well-developed.  HENT:     Head: Normocephalic.     Right Ear: Tympanic membrane, ear canal and external ear normal.     Left Ear: Tympanic membrane, ear canal  and external ear normal.     Nose: Nose normal.  Eyes:     Conjunctiva/sclera: Conjunctivae normal.     Pupils: Pupils are equal, round, and reactive to light.  Neck:     Thyroid: No thyromegaly.     Vascular: No carotid bruit.  Cardiovascular:     Rate and Rhythm: Normal rate and regular rhythm.     Heart sounds: Normal heart sounds.  Pulmonary:     Effort: Pulmonary effort is normal.     Breath sounds: Normal breath sounds. No wheezing.  Chest:  Breasts:    Right: No mass.     Left: No mass.  Abdominal:     General: Bowel sounds are normal.     Palpations: Abdomen is soft.     Tenderness: There is no abdominal tenderness.  Musculoskeletal:        General: Normal range of motion.     Cervical back: Normal range of motion and neck supple.  Lymphadenopathy:     Cervical: No cervical adenopathy.  Skin:    General: Skin is warm and dry.  Neurological:     Mental Status: He is alert and oriented to person, place, and time.     Deep Tendon Reflexes: Reflexes are normal and symmetric.  Psychiatric:        Attention and Perception: Attention normal.        Mood and Affect: Mood normal.        Thought Content: Thought content normal.    Wt Readings from Last 3 Encounters:  01/21/21 230 lb 9.6 oz (104.6 kg)  01/04/21 234 lb (106.1 kg)  12/31/20 230 lb 6.4 oz (104.5 kg)    BP 118/70   Pulse 79   Ht 6' (1.829 m)   Wt 230 lb 9.6 oz (104.6 kg)   SpO2 98%   BMI 31.27 kg/m   Assessment and Plan: 1. Mixed hyperlipidemia Tolerating statin medication without side effects at this time LDL is almost at goal of < 70 on current dose Continue same therapy without change at this time. May suggest a dose increase after labs return. - Lipid panel - simvastatin (ZOCOR) 20 MG tablet; Take 1 tablet (20 mg total) by mouth daily.  Dispense: 90 tablet; Refill: 3  2. Elevated blood pressure reading Noted in Cardiology office but normal today Pt will monitor intermittently at home -  Comprehensive metabolic panel  3. Prediabetes Check labs and advise - Hemoglobin A1c  4. Benign prostatic hyperplasia with incomplete bladder emptying  No benefit from tamsulosin or finasteride Urology recommended surgery but he declined - he will live with the sx for now. - PSA  5. Bilateral carotid artery stenosis Recent US showed < 50% bilaterally He continues on statin and aspirin  6. Centrilobular emphysema ([place]) Seen on recent scan He smoked for 55 years and only quit recently Will give samples of Breztri 2 puffs bid - call for Rx if helpful  7. Fatigue, unspecified type Uncertain cause - no apparent cardiac source - TSH - CBC with Differential/Platelet   Partially dictated using Editor, commissioning. Any errors are unintentional.  Halina Maidens, MD [facility]  01/21/2021

## 2021-01-22 ENCOUNTER — Telehealth: Payer: Self-pay | Admitting: *Deleted

## 2021-01-22 LAB — CBC WITH DIFFERENTIAL/PLATELET
Basophils Absolute: 0.1 10*3/uL (ref 0.0–0.2)
Basos: 1 %
EOS (ABSOLUTE): 0.1 10*3/uL (ref 0.0–0.4)
Eos: 1 %
Hematocrit: 49.9 % (ref 37.5–51.0)
Hemoglobin: 17.3 g/dL (ref 13.0–17.7)
Immature Grans (Abs): 0 10*3/uL (ref 0.0–0.1)
Immature Granulocytes: 0 %
Lymphocytes Absolute: 2.5 10*3/uL (ref 0.7–3.1)
Lymphs: 29 %
MCH: 32 pg (ref 26.6–33.0)
MCHC: 34.7 g/dL (ref 31.5–35.7)
MCV: 92 fL (ref 79–97)
Monocytes Absolute: 0.6 10*3/uL (ref 0.1–0.9)
Monocytes: 7 %
Neutrophils Absolute: 5.3 10*3/uL (ref 1.4–7.0)
Neutrophils: 62 %
Platelets: 277 10*3/uL (ref 150–450)
RBC: 5.41 x10E6/uL (ref 4.14–5.80)
RDW: 12.5 % (ref 11.6–15.4)
WBC: 8.6 10*3/uL (ref 3.4–10.8)

## 2021-01-22 LAB — COMPREHENSIVE METABOLIC PANEL
ALT: 15 IU/L (ref 0–44)
AST: 15 IU/L (ref 0–40)
Albumin/Globulin Ratio: 1.6 (ref 1.2–2.2)
Albumin: 4.5 g/dL (ref 3.7–4.7)
Alkaline Phosphatase: 74 IU/L (ref 44–121)
BUN/Creatinine Ratio: 12 (ref 10–24)
BUN: 13 mg/dL (ref 8–27)
Bilirubin Total: 0.4 mg/dL (ref 0.0–1.2)
CO2: 24 mmol/L (ref 20–29)
Calcium: 10.7 mg/dL — ABNORMAL HIGH (ref 8.6–10.2)
Chloride: 103 mmol/L (ref 96–106)
Creatinine, Ser: 1.08 mg/dL (ref 0.76–1.27)
Globulin, Total: 2.9 g/dL (ref 1.5–4.5)
Glucose: 105 mg/dL — ABNORMAL HIGH (ref 70–99)
Potassium: 4.8 mmol/L (ref 3.5–5.2)
Sodium: 141 mmol/L (ref 134–144)
Total Protein: 7.4 g/dL (ref 6.0–8.5)
eGFR: 72 mL/min/{1.73_m2} (ref >59)

## 2021-01-22 LAB — LIPID PANEL
Chol/HDL Ratio: 3.2 ratio (ref 0.0–5.0)
Cholesterol, Total: 159 mg/dL (ref 100–199)
HDL: 49 mg/dL (ref >39)
LDL Chol Calc (NIH): 81 mg/dL (ref 0–99)
Triglycerides: 173 mg/dL — ABNORMAL HIGH (ref 0–149)
VLDL Cholesterol Cal: 29 mg/dL (ref 5–40)

## 2021-01-22 LAB — HEMOGLOBIN A1C
Est. average glucose Bld gHb Est-mCnc: 123 mg/dL
Hgb A1c MFr Bld: 5.9 % — ABNORMAL HIGH (ref 4.8–5.6)

## 2021-01-22 LAB — PSA: Prostate Specific Ag, Serum: 2.4 ng/mL (ref 0.0–4.0)

## 2021-01-22 LAB — TSH: TSH: 5.86 u[IU]/mL — ABNORMAL HIGH (ref 0.450–4.500)

## 2021-01-22 NOTE — Telephone Encounter (Signed)
Result note read to pt, verbalizes understanding. Aware of pending labs.

## 2021-01-28 ENCOUNTER — Encounter: Payer: Self-pay | Admitting: Vascular Surgery

## 2021-01-28 ENCOUNTER — Other Ambulatory Visit: Payer: Self-pay

## 2021-01-28 ENCOUNTER — Ambulatory Visit (INDEPENDENT_AMBULATORY_CARE_PROVIDER_SITE_OTHER): Payer: Medicare Other | Admitting: Vascular Surgery

## 2021-01-28 VITALS — BP 114/71 | HR 69 | Temp 97.9°F | Resp 18 | Ht 72.0 in | Wt 229.0 lb

## 2021-01-28 DIAGNOSIS — I7141 Pararenal abdominal aortic aneurysm, without rupture: Secondary | ICD-10-CM

## 2021-01-28 DIAGNOSIS — I7123 Aneurysm of the descending thoracic aorta, without rupture: Secondary | ICD-10-CM

## 2021-01-28 DIAGNOSIS — I723 Aneurysm of iliac artery: Secondary | ICD-10-CM | POA: Diagnosis not present

## 2021-01-28 NOTE — Progress Notes (Signed)
Office Note     CC: Carotid artery stenosis Requesting Provider:  Glean Hess, MD  HPI: Mark Hooper is a 75 y.o. ([birth year]) male presenting at the request of .Glean Hess, MD.  Mark Hooper was recently seen by his PCP with right neck pain and right carotid bruit, prompting carotid duplex ultrasonography and appointment with vascular surgery.  [name] is well-known to our service line, followed by Dr. Donnetta Hutching for aneurysmal disease of his aorta extending into the right common iliac artery.  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 Roxan Hockey and Dr. Donnetta Hutching.  He was last seen in November by Dr. Roxan Hockey with 6-month CT that demonstrated no appreciable changes to his multiple aneurysms.  On exam today, [name] was doing well.  He denied new onset chest, abdominal, back pain.  Last month, he appreciated some chest pain however this resolved. He is currently scheduled for an echocardiogram on 02/07/2021.  He denied symptoms of claudication, rest pain, tissue loss.    The pt is  on a statin for cholesterol management.  The pt is  on a daily aspirin.   Other AC:  - The pt is  on medication for hypertension.   The pt is not diabetic.  Tobacco hx:  former  Past Medical History:  Diagnosis Date   Abdominal aortic aneurysm (AAA) 02/02/2018   Aneurysm, aortic (HCC)    Basal cell carcinoma 2016   Benign neoplasm of ascending colon    Benign neoplasm of descending colon    Benign neoplasm of sigmoid colon    CHF (congestive heart failure) (HCC)    Diverticulitis    DVT (deep venous thrombosis) (HCC)    Glaucoma    Hypercholesteremia    Polyp of sigmoid colon    Rectal polyp    Thoracic aortic atherosclerosis ([place]) 02/02/2018   Wears dentures    partial upper    Past Surgical History:  Procedure Laterality Date   basal cell cancer excision     CATARACT EXTRACTION     COLONOSCOPY     COLONOSCOPY WITH PROPOFOL N/A 10/27/2014   Procedure: COLONOSCOPY  WITH PROPOFOL;  Surgeon: Lucilla Lame, MD;  Location: [facility];  Service: Endoscopy;  Laterality: N/A;   COLONOSCOPY WITH PROPOFOL N/A 11/25/2019   Procedure: COLONOSCOPY WITH PROPOFOL;  Surgeon: Lucilla Lame, MD;  Location: [place];  Service: Endoscopy;  Laterality: N/A;  priority 4   POLYPECTOMY  10/27/2014   Procedure: POLYPECTOMY;  Surgeon: Lucilla Lame, MD;  Location: [place];  Service: Endoscopy;;   POLYPECTOMY  11/25/2019   Procedure: POLYPECTOMY;  Surgeon: Lucilla Lame, MD;  Location: [place];  Service: Endoscopy;;    Social History   Socioeconomic History   Marital status: Married    Spouse name: Not on file   Number of children: 2   Years of education: Not on file   Highest education level: Associate degree: academic program  Occupational History   Occupation: business owner  Tobacco Use   Smoking status: Former    Packs/day: 1.00    Years: 55.00    Pack years: 55.00    Types: Cigarettes    Quit date: 07/01/2020    Years since quitting: 0.5   Smokeless tobacco: Never   Tobacco comments:    smoking cessation information given  Vaping Use   Vaping Use: Never used  Substance and Sexual Activity   Alcohol use: Not Currently   Drug use: Never   Sexual  activity: Not on file  Other Topics Concern   Not on file  Social History Narrative   Not on file   Social Determinants of Health   Financial Resource Strain: Low Risk    Difficulty of Paying Living Expenses: Not hard at all  Food Insecurity: Not on file  Transportation Needs: No Transportation Needs   Lack of Transportation (Medical): No   Lack of Transportation (Non-Medical): No  Physical Activity: Sufficiently Active   Days of Exercise per Week: 7 days   Minutes of Exercise per Session: 30 min  Stress: No Stress Concern Present   Feeling of Stress : Not at all  Social Connections: Moderately Integrated   Frequency of Communication with Friends and Family: More than  three times a week   Frequency of Social Gatherings with Friends and Family: More than three times a week   Attends Religious Services: More than 4 times per year   Active Member of [employer] or Organizations: No   Attends Music therapist: Never   Marital Status: Married  Human resources officer Violence: Not At Risk   Fear of Current or Ex-Partner: No   Emotionally Abused: No   Physically Abused: No   Sexually Abused: No    Family History  Problem Relation Age of Onset   Heart disease Father        CABG x 3   Hyperlipidemia Father    CAD Father    Prostate cancer Neg Hx    Bladder Cancer Neg Hx    Kidney cancer Neg Hx     Current Outpatient Medications  Medication Sig Dispense Refill   aspirin EC 81 MG tablet Take 1 tablet (81 mg total) by mouth daily. Swallow whole.     latanoprost (XALATAN) 0.005 % ophthalmic solution 1 drop at bedtime.     simvastatin (ZOCOR) 20 MG tablet Take 1 tablet (20 mg total) by mouth daily. 90 tablet 3   No current facility-administered medications for this visit.    No Known Allergies   REVIEW OF SYSTEMS:   [X]  denotes positive finding, [ ]  denotes negative finding Cardiac  Comments:  Chest pain or chest pressure:    Shortness of breath upon exertion:    Short of breath when lying flat:    Irregular heart rhythm:        Vascular    Pain in calf, thigh, or hip brought on by ambulation:    Pain in feet at night that wakes you up from your sleep:     Blood clot in your veins:    Leg swelling:         Pulmonary    Oxygen at home:    Productive cough:     Wheezing:         Neurologic    Sudden weakness in arms or legs:     Sudden numbness in arms or legs:     Sudden onset of difficulty speaking or slurred speech:    Temporary loss of vision in one eye:     Problems with dizziness:         Gastrointestinal    Blood in stool:     Vomited blood:         Genitourinary    Burning when urinating:     Blood in urine:         Psychiatric    Major depression:         Hematologic    Bleeding problems:  Problems with blood clotting too easily:        Skin    Rashes or ulcers:        Constitutional    Fever or chills:      PHYSICAL EXAMINATION:  There were no vitals filed for this visit.  General:  WDWN in NAD; vital signs documented above Gait: Not observed HENT: WNL, normocephalic Pulmonary: normal non-labored breathing , without wheezing Cardiac: regular HR, Abdomen: soft, NT, no masses Skin: without rashes Vascular Exam/Pulses:  Right Left  Radial 2+ (normal) 2+ (normal)  Ulnar 2+ (normal) 2+ (normal)  Femoral    Popliteal    DP 2+ (normal) 2+ (normal)  PT     Extremities: without ischemic changes, without Gangrene , without cellulitis; without open wounds;  Musculoskeletal: no muscle wasting or atrophy  Neurologic: A&O X 3;  No focal weakness or paresthesias are detected Psychiatric:  The pt has Normal affect.   Non-Invasive Vascular Imaging:       ASSESSMENT/PLAN: CLEAVEN DEMARIO is a 75 y.o. male presenting with neck pain and right carotid bruit.  Duplex ultrasonography demonstrated mild carotid atherosclerotic disease, mainly appreciated at the proximal external carotid artery.  Indications for surgical repair are asymptomatic internal carotid artery stenosis greater than 80%, or symptomatic (stroke, TIA, amaurosis) stenosis greater than 50%.    Fortunately, Treson is asymptomatic with relatively normal internal carotid arteries, devoid of stenosis.  He does not need follow-up imaging.  Imanol was scheduled to see Dr. Donnetta Hutching next month, and therefore I called Dr. Donnetta Hutching and we reviewed Ladarrius's imaging to save him a visit.  There have been no appreciable changes to the zone 3 thoracic aneurysm, zone 4 thoracic aneurysm, pararenal aortic aneurysm, right common iliac artery aneurysm.  All of these aneurysms remain under the size criteria for elective repair per SVS guidelines except for the  right common iliac artery aneurysm.  This aneurysm is unchanged in size.  Arlind is aware of the 4 cm aneurysm which has been managed with short interval surveillance.  Being that this shows no growth Dr. Donnetta Hutching, Pilar Plate, and I agreed to continue monitoring the aneurysm.  Alando is aware that this aneurysm has an increased risk of rupture as compared to the other areas.  Should this aneurysm grow at the 88-month mark, we will discuss elective repair.  Pilar Plate and I discussed the signs and symptoms of rupture. He was asked to call 911 immediately should any of these occur I asked that Thornton to continue his current medical management and that I would see him again in 6 months following repeat CT imaging.  Broadus John, MD [facility] [phone]

## 2021-02-05 LAB — SPECIMEN STATUS REPORT

## 2021-02-05 LAB — T4

## 2021-02-05 LAB — T3 UPTAKE

## 2021-02-07 ENCOUNTER — Ambulatory Visit (INDEPENDENT_AMBULATORY_CARE_PROVIDER_SITE_OTHER): Payer: Medicare Other

## 2021-02-07 ENCOUNTER — Other Ambulatory Visit: Payer: Self-pay

## 2021-02-07 DIAGNOSIS — I712 Thoracic aortic aneurysm, without rupture, unspecified: Secondary | ICD-10-CM

## 2021-02-07 DIAGNOSIS — R079 Chest pain, unspecified: Secondary | ICD-10-CM | POA: Diagnosis not present

## 2021-02-07 DIAGNOSIS — R0609 Other forms of dyspnea: Secondary | ICD-10-CM | POA: Diagnosis not present

## 2021-02-07 DIAGNOSIS — R5383 Other fatigue: Secondary | ICD-10-CM

## 2021-02-07 LAB — ECHOCARDIOGRAM COMPLETE
AR max vel: 2.44 cm2
AV Area VTI: 2.33 cm2
AV Area mean vel: 2.18 cm2
AV Mean grad: 4 mmHg
AV Peak grad: 6.7 mmHg
Ao pk vel: 1.29 m/s
Area-P 1/2: 3.1 cm2
S' Lateral: 3.4 cm
Single Plane A4C EF: 56.3 %

## 2021-02-07 MED ORDER — PERFLUTREN LIPID MICROSPHERE
1.0000 mL | INTRAVENOUS | Status: AC | PRN
  Administered 2021-02-07: 2 mL via INTRAVENOUS

## 2021-02-15 NOTE — Progress Notes (Deleted)
Cardiology Office Note    Date:  02/15/2021   ID:  Mark Hooper, Showers [birth year], MRN [id number]  PCP:  Glean Hess, MD  Cardiologist:  Nelva Bush, MD  Electrophysiologist:  None   Chief Complaint: Follow-up  History of Present Illness:   Mark Hooper is a 75 y.o. male with history of nonobstructive CAD noted on coronary CTA in 01/2021, thoracic and abdominal aortic aneurysms followed by Drs. Hendrickson and Early, syncope in 2019, and HLD who presents for follow-up of coronary CTA and echo.  His chart also indicates a history of CHF and DVT, though the patient has not recalled ever been given these diagnoses.  Of note, upon opening his chart it appears there may have been some confusion/emerging with a separate patient.  He was seen as a new patient by Dr. Saunders Revel on 01/04/2021 for evaluation of shortness of breath, fatigue, and left scapular pain.  At his visit with cardiology, he reported a month-long history of increasing fatigue, particularly after playing a round of golf.  Historically, he would play golf 5 days a week and previously did not have any limitations.  He also noted some exertional dyspnea, particularly when walking uphill or going up stairs.  He occasionally felt a sharp pain in the chest, predominantly in the upper back behind the left shoulder blade that would seem to come on randomly and last about 30 seconds at a time.  He reported his syncopal episode in 2019, that led to incidental findings of his aortic aneurysms, occurred while he was waiting in line at the airport in [place].  He attributed this event to not having had anything to eat or drink.  He denied any further episodes.  Recent carotid artery ultrasound in 12/2020, performed for neck pain, showed less than 50% bilateral ICA stenosis.  Coronary CTA on 01/17/2021 demonstrated a descending thoracic aortic aneurysm with extensive calcified and noncalcified atherosclerotic plaque as well as a  calcium score of 391 which was the 64th percentile.  There was mild nonobstructive CAD involving the proximal to mid LAD as well as the proximal and distal RCA.  Medical therapy and risk factor modification was recommended.  Echo on 02/07/2021 demonstrated an EF of 60 to 65%, no regional wall motion abnormalities, grade 1 diastolic dysfunction, normal RV systolic function and ventricular cavity size, aortic valve sclerosis without evidence of stenosis, borderline dilatation of the aortic root and ascending aorta, and an estimated right atrial pressure of 3 mmHg.  ***   Labs independently reviewed: 01/2021 -Hgb 17.3, PLT 277, BUN 13, serum creatinine 1.08, potassium 4.8, calcium 10.7, albumin 4.5, AST/ALT normal, TSH 5.860, TC 159, TG 173, HDL 49, LDL 81, A1c 5.9  Past Medical History:  Diagnosis Date   Abdominal aortic aneurysm (AAA) 02/02/2018   Aneurysm, aortic ([facility])    Basal cell carcinoma 2016   Benign neoplasm of ascending colon    Benign neoplasm of descending colon    Benign neoplasm of sigmoid colon    CHF (congestive heart failure) ([facility] [facility])    Diverticulitis    DVT (deep venous thrombosis) ([facility])    Glaucoma    Hypercholesteremia    Polyp of sigmoid colon    Rectal polyp    Thoracic aortic atherosclerosis ([facility] [facility]) 02/02/2018   Wears dentures    partial upper    Past Surgical History:  Procedure Laterality Date   basal cell cancer excision     CATARACT EXTRACTION     COLONOSCOPY  COLONOSCOPY WITH PROPOFOL N/A 10/27/2014   Procedure: COLONOSCOPY WITH PROPOFOL;  Surgeon: Lucilla Lame, MD;  Location: [facility];  Service: Endoscopy;  Laterality: N/A;   COLONOSCOPY WITH PROPOFOL N/A 11/25/2019   Procedure: COLONOSCOPY WITH PROPOFOL;  Surgeon: Lucilla Lame, MD;  Location: [facility];  Service: Endoscopy;  Laterality: N/A;  priority 4   POLYPECTOMY  10/27/2014   Procedure: POLYPECTOMY;  Surgeon: Lucilla Lame, MD;  Location: [facility];  Service: Endoscopy;;    POLYPECTOMY  11/25/2019   Procedure: POLYPECTOMY;  Surgeon: Lucilla Lame, MD;  Location: [facility];  Service: Endoscopy;;    Current Medications: No outpatient medications have been marked as taking for the 02/20/21 encounter (Appointment) with Rise Mu, PA-C.    Allergies:   Patient has no known allergies.   Social History   Socioeconomic History   Marital status: Married    Spouse name: Not on file   Number of children: 2   Years of education: Not on file   Highest education level: Associate degree: academic program  Occupational History   Occupation: Armed forces operational officer  Tobacco Use   Smoking status: Former    Packs/day: 1.00    Years: 55.00    Pack years: 55.00    Types: Cigarettes    Quit date: 07/01/2020    Years since quitting: 0.6   Smokeless tobacco: Never   Tobacco comments:    smoking cessation information given  Vaping Use   Vaping Use: Never used  Substance and Sexual Activity   Alcohol use: Not Currently   Drug use: Never   Sexual activity: Not on file  Other Topics Concern   Not on file  Social History Narrative   Not on file   Social Determinants of Health   Financial Resource Strain: Low Risk    Difficulty of Paying Living Expenses: Not hard at all  Food Insecurity: Not on file  Transportation Needs: No Transportation Needs   Lack of Transportation (Medical): No   Lack of Transportation (Non-Medical): No  Physical Activity: Sufficiently Active   Days of Exercise per Week: 7 days   Minutes of Exercise per Session: 30 min  Stress: No Stress Concern Present   Feeling of Stress : Not at all  Social Connections: Moderately Integrated   Frequency of Communication with Friends and Family: More than three times a week   Frequency of Social Gatherings with Friends and Family: More than three times a week   Attends Religious Services: More than 4 times per year   Active Member of [employer] or Organizations: No   Attends Archivist  Meetings: Never   Marital Status: Married     Family History:  The patient's family history includes CAD in his father; Heart disease in his father; Hyperlipidemia in his father. There is no history of Prostate cancer, Bladder Cancer, or Kidney cancer.  ROS:   ROS   EKGs/Labs/Other Studies Reviewed:    Studies reviewed were summarized above. The additional studies were reviewed today: ***  EKG:  EKG is ordered today.  The EKG ordered today demonstrates ***  Recent Labs: 01/21/2021: ALT 15; BUN 13; Creatinine, Ser 1.08; Hemoglobin 17.3; Platelets 277; Potassium 4.8; Sodium 141; TSH 5.860  Recent Lipid Panel    Component Value Date/Time   CHOL 159 01/21/2021 1030   TRIG 173 (H) 01/21/2021 1030   HDL 49 01/21/2021 1030   CHOLHDL 3.2 01/21/2021 1030   LDLCALC 81 01/21/2021 1030    PHYSICAL EXAM:  VS:  There were no vitals taken for this visit.  BMI: There is no height or weight on file to calculate BMI.  Physical Exam  Wt Readings from Last 3 Encounters:  01/28/21 229 lb (103.9 kg)  01/21/21 230 lb 9.6 oz (104.6 kg)  01/04/21 234 lb (106.1 kg)     ASSESSMENT & PLAN:   Nonobstructive CAD:  Thoracic and abdominal aortic aneurysms:  HLD: LDL 81 in 01/2020 with goal being less than 70.  Elevated blood pressure: ***  Hypercalcemia: Recommend patient follow-up with PCP.   {Are you ordering a CV Procedure (e.g. stress test, cath, DCCV, TEE, etc)?   Press F2        :[id number]}     Disposition: F/u with Dr. Saunders Revel or an APP in ***.   Medication Adjustments/Labs and Tests Ordered: Current medicines are reviewed at length with the patient today.  Concerns regarding medicines are outlined above. Medication changes, Labs and Tests ordered today are summarized above and listed in the Patient Instructions accessible in Encounters.   Signed, Christell Faith, PA-C 02/15/2021 8:10 AM     [facility] [address] [place] [address] [place], [place] [address] [phone]

## 2021-02-20 ENCOUNTER — Ambulatory Visit: Payer: Medicare Other | Admitting: Physician Assistant

## 2021-03-20 NOTE — Progress Notes (Signed)
Cardiology Office Note    Date:  03/22/2021   ID:  Mark, Hooper 12/05/1945, MRN [id number]  PCP:  Glean Hess, MD  Cardiologist:  Nelva Bush, MD  Electrophysiologist:  None   Chief Complaint: Follow-up  History of Present Illness:   Mark Hooper is a 76 y.o. male with history of nonobstructive CAD noted on coronary CTA in 01/2021, thoracic and abdominal aortic aneurysms followed by Drs. Hendrickson and Early, syncope in 2019, and HLD who presents for follow-up of coronary CTA and echo.  His chart also indicates a history of CHF and DVT, though the patient has not recalled ever been given these diagnoses.  Of note, upon opening his chart it appears there may have been some confusion/emerging with a separate patient.  He was seen as a new patient by Dr. Saunders Revel on 01/04/2021 for evaluation of shortness of breath, fatigue, and left scapular pain.  At his visit with cardiology, he reported a month-long history of increasing fatigue, particularly after playing a round of golf.  Historically, he would play golf 5 days a week and previously did not have any limitations.  He also noted some exertional dyspnea, particularly when walking uphill or going up stairs.  He occasionally felt a sharp pain in the chest, predominantly in the upper back behind the left shoulder blade that would seem to come on randomly and last about 30 seconds at a time.  He reported his syncopal episode in 2019, that led to incidental findings of his aortic aneurysms, occurred while he was waiting in line at the airport in [place].  He attributed this event to not having had anything to eat or drink.  He denied any further episodes.  Recent carotid artery ultrasound in 12/2020, performed for neck pain, showed less than 50% bilateral ICA stenosis.  Coronary CTA on 01/17/2021 demonstrated a descending thoracic aortic aneurysm with extensive calcified and noncalcified atherosclerotic plaque as well as a  calcium score of 391 which was the 64th percentile.  There was mild nonobstructive CAD involving the proximal to mid LAD as well as the proximal and distal RCA.  Medical therapy and risk factor modification was recommended.  Echo on 02/07/2021 demonstrated an EF of 60 to 65%, no regional wall motion abnormalities, grade 1 diastolic dysfunction, normal RV systolic function and ventricular cavity size, aortic valve sclerosis without evidence of stenosis, borderline dilatation of the aortic root and ascending aorta, and an estimated right atrial pressure of 3 mmHg.  He comes in and is doing very well from a cardiac perspective.  No symptoms of angina or decompensation.  No dizziness, presyncope, or syncope.  He remains active, playing golf multiple days per week without cardiac limitation.  No significant lower extremity swelling or orthopnea.  He is followed by vascular surgery and cardiothoracic surgery for thoracoabdominal aortic aneurysm.  He has been without back or abdominal pain.  Most recent imaging has shown no appreciable changes in his thoracic aneurysms, pararenal aortic aneurysm, and right common iliac artery aneurysm.   Labs independently reviewed: 01/2021 - Hgb 17.3, PLT 277, BUN 13, serum creatinine 1.08, potassium 4.8, albumin 4.5, AST/ALT normal, TSH 5.860, TC 159, TG 173, HDL 49, LDL 81, A1c 5.9    Past Medical History:  Diagnosis Date   Abdominal aortic aneurysm (AAA) 02/02/2018   Aneurysm, aortic ([facility] [place])    Basal cell carcinoma 2016   Benign neoplasm of ascending colon    Benign neoplasm of descending colon  Benign neoplasm of sigmoid colon    CHF (congestive heart failure) ([facility])    Diverticulitis    DVT (deep venous thrombosis) ([facility])    Glaucoma    Hypercholesteremia    Polyp of sigmoid colon    Rectal polyp    Thoracic aortic atherosclerosis ([facility]) 02/02/2018   Wears dentures    partial upper    Past Surgical History:  Procedure Laterality Date   basal cell cancer  excision     CATARACT EXTRACTION     COLONOSCOPY     COLONOSCOPY WITH PROPOFOL N/A 10/27/2014   Procedure: COLONOSCOPY WITH PROPOFOL;  Surgeon: Lucilla Lame, MD;  Location: [facility];  Service: Endoscopy;  Laterality: N/A;   COLONOSCOPY WITH PROPOFOL N/A 11/25/2019   Procedure: COLONOSCOPY WITH PROPOFOL;  Surgeon: Lucilla Lame, MD;  Location: [facility];  Service: Endoscopy;  Laterality: N/A;  priority 4   POLYPECTOMY  10/27/2014   Procedure: POLYPECTOMY;  Surgeon: Lucilla Lame, MD;  Location: [facility];  Service: Endoscopy;;   POLYPECTOMY  11/25/2019   Procedure: POLYPECTOMY;  Surgeon: Lucilla Lame, MD;  Location: [facility];  Service: Endoscopy;;    Current Medications: Current Meds  Medication Sig   aspirin EC 81 MG tablet Take 1 tablet (81 mg total) by mouth daily. Swallow whole.   atorvastatin (LIPITOR) 40 MG tablet Take 1 tablet (40 mg total) by mouth daily.   latanoprost (XALATAN) 0.005 % ophthalmic solution 1 drop at bedtime.   losartan (COZAAR) 25 MG tablet Take 0.5 tablets (12.5 mg total) by mouth daily.   [DISCONTINUED] simvastatin (ZOCOR) 20 MG tablet Take 1 tablet (20 mg total) by mouth daily.    Allergies:   Patient has no known allergies.   Social History   Socioeconomic History   Marital status: Married    Spouse name: Not on file   Number of children: 2   Years of education: Not on file   Highest education level: Associate degree: academic program  Occupational History   Occupation: Armed forces operational officer  Tobacco Use   Smoking status: Former    Packs/day: 1.00    Years: 55.00    Pack years: 55.00    Types: Cigarettes    Quit date: 07/01/2020    Years since quitting: 0.7   Smokeless tobacco: Never   Tobacco comments:    smoking cessation information given  Vaping Use   Vaping Use: Never used  Substance and Sexual Activity   Alcohol use: Not Currently   Drug use: Never   Sexual activity: Not on file  Other Topics Concern   Not  on file  Social History Narrative   Not on file   Social Determinants of Health   Financial Resource Strain: Low Risk    Difficulty of Paying Living Expenses: Not hard at all  Food Insecurity: Not on file  Transportation Needs: No Transportation Needs   Lack of Transportation (Medical): No   Lack of Transportation (Non-Medical): No  Physical Activity: Sufficiently Active   Days of Exercise per Week: 7 days   Minutes of Exercise per Session: 30 min  Stress: No Stress Concern Present   Feeling of Stress : Not at all  Social Connections: Moderately Integrated   Frequency of Communication with Friends and Family: More than three times a week   Frequency of Social Gatherings with Friends and Family: More than three times a week   Attends Religious Services: More than 4 times per year   Active Member of [employer]  or Organizations: No   Attends Archivist Meetings: Never   Marital Status: Married     Family History:  The patient's family history includes CAD in his father; Heart disease in his father; Hyperlipidemia in his father. There is no history of Prostate cancer, Bladder Cancer, or Kidney cancer.  ROS:   12-point review of system is negative unless otherwise noted in HPI.   EKGs/Labs/Other Studies Reviewed:    Studies reviewed were summarized above. The additional studies were reviewed today:  2D echo 02/05/2021: 1. Left ventricular ejection fraction, by estimation, is 60 to 65%. The  left ventricle has normal function. The left ventricle has no regional  wall motion abnormalities. Left ventricular diastolic parameters are  consistent with Grade I diastolic  dysfunction (impaired relaxation).   2. Right ventricular systolic function is normal. The right ventricular  size is normal. Tricuspid regurgitation signal is inadequate for assessing  PA pressure.   3. The mitral valve is normal in structure. No evidence of mitral valve  regurgitation. No evidence of mitral  stenosis.   4. The aortic valve was not well visualized. Aortic valve regurgitation  is not visualized. Aortic valve sclerosis/calcification is present,  without any evidence of aortic stenosis.   5. There is borderline dilatation of the aortic root and of the ascending  aorta, measuring 37 mm.   6. The inferior vena cava is normal in size with greater than 50%  respiratory variability, suggesting right atrial pressure of 3 mmHg.   7. Challenging images, definity used.   Comparison(s): LVEF 60-65%. __________  Carotid artery ultrasound 01/02/2021: IMPRESSION: 1. Right carotid artery system: Less than 50% stenosis secondary to mild scattered atherosclerotic plaque formation.   2. Left carotid artery system: Less than 50% stenosis secondary to mild scattered atherosclerotic plaque formation.   3.  Vertebral artery system: Patent with antegrade flow bilaterally.    EKG:  EKG is not ordered today.    Recent Labs: 01/21/2021: ALT 15; BUN 13; Creatinine, Ser 1.08; Hemoglobin 17.3; Platelets 277; Potassium 4.8; Sodium 141; TSH 5.860  Recent Lipid Panel    Component Value Date/Time   CHOL 159 01/21/2021 1030   TRIG 173 (H) 01/21/2021 1030   HDL 49 01/21/2021 1030   CHOLHDL 3.2 01/21/2021 1030   LDLCALC 81 01/21/2021 1030    PHYSICAL EXAM:    VS:  BP 120/82 (BP Location: Left Arm, Patient Position: Sitting, Cuff Size: Normal)    Pulse 66    Ht 6' (1.829 m)    Wt 231 lb (104.8 kg)    SpO2 96%    BMI 31.33 kg/m   BMI: Body mass index is 31.33 kg/m.  Physical Exam Vitals reviewed.  Constitutional:      Appearance: He is well-developed.  HENT:     Head: Normocephalic and atraumatic.  Eyes:     General:        Right eye: No discharge.        Left eye: No discharge.  Neck:     Vascular: No JVD.  Cardiovascular:     Rate and Rhythm: Normal rate and regular rhythm.     Pulses:          Posterior tibial pulses are 2+ on the right side and 2+ on the left side.     Heart  sounds: Normal heart sounds, S1 normal and S2 normal. Heart sounds not distant. No midsystolic click and no opening snap. No murmur heard.   No friction rub.  Pulmonary:     Effort: Pulmonary effort is normal. No respiratory distress.     Breath sounds: Normal breath sounds. No decreased breath sounds, wheezing or rales.  Chest:     Chest wall: No tenderness.  Abdominal:     General: There is no distension.     Palpations: Abdomen is soft.     Tenderness: There is no abdominal tenderness.  Musculoskeletal:     Cervical back: Normal range of motion.     Right lower leg: No edema.     Left lower leg: No edema.  Skin:    General: Skin is warm and dry.     Nails: There is no clubbing.  Neurological:     Mental Status: He is alert and oriented to person, place, and time.  Psychiatric:        Speech: Speech normal.        Behavior: Behavior normal.        Thought Content: Thought content normal.        Judgment: Judgment normal.    Wt Readings from Last 3 Encounters:  03/22/21 231 lb (104.8 kg)  01/28/21 229 lb (103.9 kg)  01/21/21 230 lb 9.6 oz (104.6 kg)     ASSESSMENT & PLAN:   Nonobstructive CAD: He is doing well without symptoms concerning for angina.  Escalate medical therapy with transition of simvastatin to atorvastatin 40 mg as outlined below.  Otherwise, he remains on aspirin.  No indication for further ischemic testing.  Thoracic and abdominal aortic aneurysms: Stable on recent imaging.  Followed by cardiothoracic surgery and vascular surgery.  No longer smoking.  Optimal blood pressure and lipid control recommended.  Avoid fluoroquinolone use.  HLD: LDL 81 in 01/2020 with goal being less than 70.  Stop simvastatin.  Start atorvastatin 40 mg daily.  Elevated blood pressure: Blood pressure is improved in the office today, though does remain mildly above goal given his comorbid conditions.  Initiate losartan 12.5 mg daily.  Follow-up BMP 1 week after initiating  ARB.    Disposition: F/u with Dr. Saunders Revel or an APP in 4 months.   Medication Adjustments/Labs and Tests Ordered: Current medicines are reviewed at length with the patient today.  Concerns regarding medicines are outlined above. Medication changes, Labs and Tests ordered today are summarized above and listed in the Patient Instructions accessible in Encounters.   Signed, Christell Faith, PA-C 03/22/2021 5:38 PM     [address] [place], [place] [address] [phone]

## 2021-03-22 ENCOUNTER — Ambulatory Visit (INDEPENDENT_AMBULATORY_CARE_PROVIDER_SITE_OTHER): Payer: Medicare Other | Admitting: Physician Assistant

## 2021-03-22 ENCOUNTER — Other Ambulatory Visit: Payer: Self-pay

## 2021-03-22 ENCOUNTER — Encounter: Payer: Self-pay | Admitting: Physician Assistant

## 2021-03-22 VITALS — BP 120/82 | HR 66 | Ht 72.0 in | Wt 231.0 lb

## 2021-03-22 DIAGNOSIS — I712 Thoracic aortic aneurysm, without rupture, unspecified: Secondary | ICD-10-CM

## 2021-03-22 DIAGNOSIS — R030 Elevated blood-pressure reading, without diagnosis of hypertension: Secondary | ICD-10-CM

## 2021-03-22 DIAGNOSIS — I714 Abdominal aortic aneurysm, without rupture, unspecified: Secondary | ICD-10-CM

## 2021-03-22 DIAGNOSIS — I2510 Atherosclerotic heart disease of native coronary artery without angina pectoris: Secondary | ICD-10-CM | POA: Diagnosis not present

## 2021-03-22 DIAGNOSIS — Z79899 Other long term (current) drug therapy: Secondary | ICD-10-CM

## 2021-03-22 DIAGNOSIS — E785 Hyperlipidemia, unspecified: Secondary | ICD-10-CM | POA: Diagnosis not present

## 2021-03-22 MED ORDER — ATORVASTATIN CALCIUM 40 MG PO TABS: 40.0000 mg | ORAL_TABLET | Freq: Every day | ORAL | 1 refills | Status: DC

## 2021-03-22 MED ORDER — LOSARTAN POTASSIUM 25 MG PO TABS
12.5000 mg | ORAL_TABLET | Freq: Every day | ORAL | 1 refills | Status: DC
Start: 2021-03-22 — End: 2021-04-10

## 2021-03-22 NOTE — Patient Instructions (Signed)
Medication Instructions:  - Your physician has recommended you make the following change in your medication:   1) STOP simvastatin  2) START atrovastatin (lipitor) 40 mg: - take 1 tablet by mouth once daily   3) START losartan 25 mg: - take 0.5 tablet (12.5 mg) by mouth once daily   *If you need a refill on your cardiac medications before your next appointment, please call your pharmacy*   Lab Work: - Your physician recommends that you return for lab work in: 1 week- BMP   If you have labs (blood work) drawn today and your tests are completely normal, you will receive your results only by: [place] (if you have MyChart) OR A paper copy in the mail If you have any lab test that is abnormal or we need to change your treatment, we will call you to review the results.   Testing/Procedures: - none ordered   Follow-Up: At [facility], you and your health needs are our priority.  As part of our continuing mission to provide you with exceptional heart care, we have created designated Provider Care Teams.  These Care Teams include your primary Cardiologist (physician) and Advanced Practice Providers (APPs -  Physician Assistants and Nurse Practitioners) who all work together to provide you with the care you need, when you need it.  We recommend signing up for the patient portal called "MyChart".  Sign up information is provided on this After Visit Summary.  MyChart is used to connect with patients for Virtual Visits (Telemedicine).  Patients are able to view lab/test results, encounter notes, upcoming appointments, etc.  Non-urgent messages can be sent to your provider as well.   To learn more about what you can do with MyChart, go to [email or web address].    Your next appointment:   4 month(s)  The format for your next appointment:   In Person  Provider:   You may see Nelva Bush, MD or one of the following Advanced Practice Providers on your designated Care Team:     Christell Faith, PA-C    Other Instructions  LIPITOR (Atorvastatin) Tablets What is this medication? ATORVASTATIN (a TORE va sta tin) treats high cholesterol and reduces the risk of heart attack and stroke. It works by decreasing bad cholesterol and fats (such as LDL, triglycerides) and increasing good cholesterol (HDL) in your blood. It belongs to a group of medications called statins. Changes to diet and exercise are often combined with this medication. This medicine may be used for other purposes; ask your health care provider or pharmacist if you have questions. COMMON BRAND NAME(S): Lipitor What should I tell my care team before I take this medication? They need to know if you have any of these conditions: Diabetes Frequently drink alcohol Kidney disease Liver disease Muscle cramps, pain Stroke Thyroid disease An unusual or allergic reaction to atorvastatin, other medications, foods, dyes, or preservatives Pregnant or trying to get pregnant Breast-feeding How should I use this medication? Take this medication by mouth. Take it as directed on the label at the same time every day. You can take it with or without food. If it upsets your stomach, take it with food. Keep taking it unless your care team tells you to stop. Do not take this medication with grapefruit juice. Talk to your care team about the use of this medication in children. While it may be prescribed for children as young as 10 for selected conditions, precautions do apply. Overdosage: If you think you have taken  too much of this medicine contact a poison control center or emergency room at once. NOTE: This medicine is only for you. Do not share this medicine with others. What if I miss a dose? If you miss a dose, take it as soon as you can. If it is almost time for your next dose, take only that dose. Do not take double or extra doses. What may interact with this medication? Do not take this medication with any of the  following: Dasabuvir; ombitasvir; paritaprevir; ritonavir Ombitasvir; paritaprevir; ritonavir Posaconazole Red yeast rice This medication may also interact with the following: Alcohol Certain antibiotics like erythromycin and clarithromycin Certain antivirals for HIV or hepatitis Certain medications for cholesterol like fenofibrate, gemfibrozil, and niacin Certain medications for fungal infections like ketoconazole and itraconazole Colchicine Cyclosporine Digoxin Estrogen or progestin hormones Grapefruit juice Rifampin This list may not describe all possible interactions. Give your health care provider a list of all the medicines, herbs, non-prescription drugs, or dietary supplements you use. Also tell them if you smoke, drink alcohol, or use illegal drugs. Some items may interact with your medicine. What should I watch for while using this medication? Visit your care team for regular checks on your progress. Tell your care team if your symptoms do not start to get better or if they get worse. Your care team may tell you to stop taking this medication if you develop muscle problems. If your muscle problems do not go away after stopping this medication, contact your care team. Talk to your care team if you wish to become pregnant or think you might be pregnant. This medication can cause serious birth defects. Talk to your care team before breastfeeding. Changes to your treatment plan may be needed. This medication may increase blood sugar. Ask your care team if changes in diet or medications are needed if you have diabetes. If you are going to need surgery or other procedure, tell your care team that you are using this medication. Taking this medication is only part of a total heart healthy program. Ask your care team if there are other changes you can make to improve your overall health. What side effects may I notice from receiving this medication? Side effects that you should report to  your care team as soon as possible: Allergic reactions--skin rash, itching, hives, swelling of the face, lips, tongue, or throat High blood sugar (hyperglycemia)--increased thirst or amount of urine, unusual weakness, fatigue, blurry vision Liver injury--right upper belly pain, loss of appetite, nausea, light-colored stool, dark yellow or brown urine, yellowing skin or eyes, unusual weakness, fatigue Muscle injury--unusual weakness, fatigue, muscle pain, dark yellow or brown urine, decrease in amount of urine Redness, blistering, peeling, or loosening of the skin, including inside the mouth Side effects that usually do not require medical attention (report to your care team if they continue or are bothersome): Diarrhea Nausea Trouble sleeping Upset stomach This list may not describe all possible side effects. Call your doctor for medical advice about side effects. You may report side effects to FDA at 1-800-FDA-1088. Where should I keep my medication? Keep out of the reach of children and pets. Store at room temperature between 20 and 25 degrees C (68 and 77 degrees F). Get rid of any unused medication after the expiration date. To get rid of medications that are no longer needed or have expired: Take the medication to a medication take-back program. Check with your pharmacy or law enforcement to find a location. If you cannot  return the medication, check the label or package insert to see if the medication should be thrown out in the garbage or flushed down the toilet. If you are not sure, ask your care team. If it is safe to put it in the trash, take the medication out of the container. Mix the medication with cat litter, dirt, coffee grounds, or other unwanted substance. Seal the mixture in a bag or container. Put it in the trash. NOTE: This sheet is a summary. It may not cover all possible information. If you have questions about this medicine, talk to your doctor, pharmacist, or health care  provider.  2022 Elsevier/Gold Standard (2020-08-06 00:00:00)   COZAAR (Losartan) Tablets What is this medication? LOSARTAN (loe SAR tan) treats high blood pressure. It may also be used to prevent a stroke in people with heart disease and high blood pressure. It can be used to prevent kidney damage in people with diabetes. It works by relaxing the blood vessels, which helps decrease the amount of work your heart has to do. It belongs to a group of medications called ARBs. This medicine may be used for other purposes; ask your health care provider or pharmacist if you have questions. COMMON BRAND NAME(S): Cozaar What should I tell my care team before I take this medication? They need to know if you have any of these conditions: Heart failure Kidney disease Liver disease An unusual or allergic reaction to losartan, other medications, foods, dyes, or preservatives Pregnant or trying to get pregnant Breast-feeding How should I use this medication? Take this medication by mouth. Take it as directed on the prescription label at the same time every day. You can take it with or without food. If it upsets your stomach, take it with food. Keep taking it unless your care team tells you to stop. Talk to your care team about the use of this medication in children. While it may be prescribed for children as young as 6 for selected conditions, precautions do apply. Overdosage: If you think you have taken too much of this medicine contact a poison control center or emergency room at once. NOTE: This medicine is only for you. Do not share this medicine with others. What if I miss a dose? If you miss a dose, take it as soon as you can. If it is almost time for your next dose, take only that dose. Do not take double or extra doses. What may interact with this medication? Aliskiren ACE inhibitors, like enalapril or lisinopril Diuretics, especially amiloride, eplerenone, spironolactone, or  triamterene Lithium NSAIDs, medications for pain and inflammation, like ibuprofen or naproxen Potassium salts or potassium supplements This list may not describe all possible interactions. Give your health care provider a list of all the medicines, herbs, non-prescription drugs, or dietary supplements you use. Also tell them if you smoke, drink alcohol, or use illegal drugs. Some items may interact with your medicine. What should I watch for while using this medication? Visit your care team for regular check ups. Check your blood pressure as directed. Ask your care team what your blood pressure should be. Also, find out when you should contact them. Do not treat yourself for coughs, colds, or pain while you are using this medication without asking your care team for advice. Some medications may increase your blood pressure. Women should inform their care team if they wish to become pregnant or think they might be pregnant. There is a potential for serious side effects to an unborn  child. Talk to your care team for more information. You may get drowsy or dizzy. Do not drive, use machinery, or do anything that needs mental alertness until you know how this medication affects you. Do not stand or sit up quickly, especially if you are an older patient. This reduces the risk of dizzy or fainting spells. Alcohol can make you more drowsy and dizzy. Avoid alcoholic drinks. Avoid salt substitutes unless you are told otherwise by your care team. What side effects may I notice from receiving this medication? Side effects that you should report to your care team as soon as possible: Allergic reactions--skin rash, itching, hives, swelling of the face, lips, tongue, or throat High potassium level--muscle weakness, fast or irregular heartbeat Kidney injury--decrease in the amount of urine, swelling of the ankles, hands, or feet Low blood pressure--dizziness, feeling faint or lightheaded, blurry vision Side effects  that usually do not require medical attention (report to your care team if they continue or are bothersome): Dizziness Headache Runny or stuffy nose This list may not describe all possible side effects. Call your doctor for medical advice about side effects. You may report side effects to FDA at 1-800-FDA-1088. Where should I keep my medication? Keep out of the reach of children and pets. Store at room temperature between 20 and 25 degrees C (68 and 77 degrees F). Protect from light. Keep the container tightly closed. Get rid of any unused medication after the expiration date. To get rid of medications that are no longer needed or have expired: Take the medication to a medication take-back program. Check with your pharmacy or law enforcement to find a location. If you cannot return the medication, check the label or package insert to see if the medication should be thrown out in the garbage or flushed down the toilet. If you are not sure, ask your care team. If it is safe to put in the trash, empty the medication out of the container. Mix the medication with cat litter, dirt, coffee grounds, or other unwanted substance. Seal the mixture in a bag or container. Put it in the trash. NOTE: This sheet is a summary. It may not cover all possible information. If you have questions about this medicine, talk to your doctor, pharmacist, or health care provider.  2022 Elsevier/Gold Standard (2020-10-23 00:00:00)

## 2021-04-03 ENCOUNTER — Other Ambulatory Visit: Payer: Self-pay

## 2021-04-03 ENCOUNTER — Other Ambulatory Visit (INDEPENDENT_AMBULATORY_CARE_PROVIDER_SITE_OTHER): Payer: Medicare Other

## 2021-04-03 DIAGNOSIS — R030 Elevated blood-pressure reading, without diagnosis of hypertension: Secondary | ICD-10-CM | POA: Diagnosis not present

## 2021-04-03 DIAGNOSIS — Z79899 Other long term (current) drug therapy: Secondary | ICD-10-CM

## 2021-04-04 LAB — BASIC METABOLIC PANEL
BUN/Creatinine Ratio: 15 (ref 10–24)
BUN: 14 mg/dL (ref 8–27)
CO2: 23 mmol/L (ref 20–29)
Calcium: 10.5 mg/dL — ABNORMAL HIGH (ref 8.6–10.2)
Chloride: 104 mmol/L (ref 96–106)
Creatinine, Ser: 0.95 mg/dL (ref 0.76–1.27)
Glucose: 105 mg/dL — ABNORMAL HIGH (ref 70–99)
Potassium: 4.9 mmol/L (ref 3.5–5.2)
Sodium: 140 mmol/L (ref 134–144)
eGFR: 84 mL/min/{1.73_m2} (ref >59)

## 2021-04-04 NOTE — Progress Notes (Signed)
Called pt to schedule OV for calcium recheck. Left VM to call back. Please schedule pt appoint.  PEC nurse may give results to patient if they return call to clinic, a CRM has been created.   KP

## 2021-04-05 ENCOUNTER — Encounter: Payer: Self-pay | Admitting: Internal Medicine

## 2021-04-05 ENCOUNTER — Other Ambulatory Visit: Payer: Self-pay

## 2021-04-05 ENCOUNTER — Ambulatory Visit (INDEPENDENT_AMBULATORY_CARE_PROVIDER_SITE_OTHER): Payer: Medicare Other | Admitting: Internal Medicine

## 2021-04-05 DIAGNOSIS — I723 Aneurysm of iliac artery: Secondary | ICD-10-CM | POA: Diagnosis not present

## 2021-04-05 DIAGNOSIS — I2510 Atherosclerotic heart disease of native coronary artery without angina pectoris: Secondary | ICD-10-CM | POA: Diagnosis not present

## 2021-04-05 DIAGNOSIS — J432 Centrilobular emphysema: Secondary | ICD-10-CM

## 2021-04-05 NOTE — Progress Notes (Signed)
Date:  04/05/2021   Name:  Mark Hooper   DOB:  1945/06/22   MRN:  [id number]   Chief Complaint: Hypercalcemia  HPI Hypercalcemia-patient had recent labs with cardiology that showed a calcium level of 10.5.  2 months ago he had a calcium level of 10.7.  He denies muscle cramps or spasms pain or weakness.  No constipation or diarrhea.  He does not take a diuretic.  His only medications are a statin and losartan. Lab Results  Component Value Date   NA 140 04/03/2021   K 4.9 04/03/2021   CO2 23 04/03/2021   GLUCOSE 105 (H) 04/03/2021   BUN 14 04/03/2021   CREATININE 0.95 04/03/2021   CALCIUM 10.5 (H) 04/03/2021   EGFR 84 04/03/2021   GFRNONAA >60 01/04/2021   Lab Results  Component Value Date   CHOL 159 01/21/2021   HDL 49 01/21/2021   LDLCALC 81 01/21/2021   TRIG 173 (H) 01/21/2021   CHOLHDL 3.2 01/21/2021   Lab Results  Component Value Date   TSH 5.860 (H) 01/21/2021   Lab Results  Component Value Date   HGBA1C 5.9 (H) 01/21/2021   Lab Results  Component Value Date   WBC 8.6 01/21/2021   HGB 17.3 01/21/2021   HCT 49.9 01/21/2021   MCV 92 01/21/2021   PLT 277 01/21/2021   Lab Results  Component Value Date   ALT 15 01/21/2021   AST 15 01/21/2021   ALKPHOS 74 01/21/2021   BILITOT 0.4 01/21/2021   No results found for: 25OHVITD2, 25OHVITD3, VD25OH   Review of Systems  Constitutional:  Negative for chills, fatigue and fever.  Respiratory:  Negative for chest tightness, shortness of breath and wheezing.   Cardiovascular:  Negative for chest pain and leg swelling.  Musculoskeletal:  Negative for myalgias.  Neurological:  Negative for dizziness and headaches.  Psychiatric/Behavioral:  Negative for dysphoric mood and sleep disturbance. The patient is not nervous/anxious.    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[place]) 02/02/2018   Abdominal aortic aneurysm (AAA) without rupture 02/02/2018   Aortic dilatation ([place]) 10/14/2017   Benign prostatic hyperplasia with incomplete bladder emptying 02/05/2015   Hx of colonic polyps    Tobacco use disorder 09/01/2014   Basal cell carcinoma of nose 08/29/2014   Mixed hyperlipidemia 08/29/2014   Plantar fasciitis 08/29/2014   History of malignant melanoma of skin 09/08/2013   Hx of abdominal abscess 07/29/2013    No Known Allergies  Past Surgical History:  Procedure Laterality Date   basal cell cancer excision     CATARACT EXTRACTION     COLONOSCOPY     COLONOSCOPY WITH PROPOFOL N/A 10/27/2014   Procedure: COLONOSCOPY WITH PROPOFOL;  Surgeon: Lucilla Lame, MD;  Location: [facility];  Service: Endoscopy;  Laterality: N/A;   COLONOSCOPY WITH PROPOFOL N/A 11/25/2019   Procedure: COLONOSCOPY WITH PROPOFOL;  Surgeon: Lucilla Lame, MD;  Location: [facility];  Service: Endoscopy;  Laterality: N/A;  priority 4   POLYPECTOMY  10/27/2014   Procedure: POLYPECTOMY;  Surgeon: Lucilla Lame, MD;  Location: [facility];  Service: Endoscopy;;   POLYPECTOMY  11/25/2019  Procedure: POLYPECTOMY;  Surgeon: Lucilla Lame, MD;  Location: [facility];  Service: Endoscopy;;    Social History   Tobacco Use   Smoking status: Former    Packs/day: 1.00    Years: 55.00    Pack years: 55.00    Types: Cigarettes    Quit date: 07/01/2020    Years since quitting: 0.7   Smokeless tobacco: Never   Tobacco comments:    smoking cessation information given  Vaping Use   Vaping Use: Never used  Substance Use Topics   Alcohol use: Not Currently   Drug use: Never     Medication list has been reviewed and updated.  Current Meds   Medication Sig   aspirin EC 81 MG tablet Take 1 tablet (81 mg total) by mouth daily. Swallow whole.   atorvastatin (LIPITOR) 40 MG tablet Take 1 tablet (40 mg total) by mouth daily.   latanoprost (XALATAN) 0.005 % ophthalmic solution 1 drop at bedtime.   losartan (COZAAR) 25 MG tablet Take 0.5 tablets (12.5 mg total) by mouth daily.    PHQ 2/9 Scores 04/05/2021 01/21/2021 08/22/2020 04/18/2020  PHQ - 2 Score 0 0 0 0  PHQ- 9 Score 0 0 - -    GAD 7 : Generalized Anxiety Score 04/05/2021 01/21/2021 01/18/2020 05/30/2019  Nervous, Anxious, on Edge 0 0 0 0  Control/stop worrying 0 0 0 0  Worry too much - different things 0 0 0 0  Trouble relaxing 0 0 0 0  Restless 0 0 0 0  Easily annoyed or irritable 0 0 0 0  Afraid - awful might happen 0 0 0 0  Total GAD 7 Score 0 0 0 0  Anxiety Difficulty Not difficult at all Not difficult at all - Not difficult at all    BP Readings from Last 3 Encounters:  04/05/21 122/70  03/22/21 120/82  01/28/21 114/71    Physical Exam Vitals and nursing note reviewed.  Constitutional:      General: He is not in acute distress.    Appearance: Normal appearance. He is well-developed.  HENT:     Head: Normocephalic and atraumatic.  Cardiovascular:     Rate and Rhythm: Normal rate and regular rhythm.     Pulses: Normal pulses.     Heart sounds: No murmur heard. Pulmonary:     Effort: Pulmonary effort is normal. No respiratory distress.     Breath sounds: No wheezing or rhonchi.  Musculoskeletal:     Cervical back: Normal range of motion.     Right lower leg: No edema.     Left lower leg: No edema.  Lymphadenopathy:     Cervical: No cervical adenopathy.  Skin:    General: Skin is warm and dry.     Findings: No rash.  Neurological:     Mental Status: He is alert and oriented to person, place, and time.  Psychiatric:        Mood and Affect: Mood normal.        Behavior: Behavior normal.    Wt Readings from Last 3 Encounters:  04/05/21 233 lb (105.7  kg)  03/22/21 231 lb (104.8 kg)  01/28/21 229 lb (103.9 kg)    BP 122/70    Pulse 71    Ht 6' (1.829 m)    Wt 233 lb (105.7 kg)    SpO2 95%    BMI 31.60 kg/m   Assessment and Plan: 1. 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 Will get labs and  advise.  He is asymptomatic at this time. - PTH, Intact and Calcium  2. Centrilobular emphysema ([place]) Stable; he continues to play golf without limitations  3. 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 Last scan showed these to be stable in size.   Partially dictated using Editor, commissioning. Any errors are unintentional.  Halina Maidens, MD [facility]  04/05/2021

## 2021-04-06 LAB — PTH, INTACT AND CALCIUM
Calcium: 9.9 mg/dL (ref 8.6–10.2)
PTH: 59 pg/mL (ref 15–65)

## 2021-04-10 ENCOUNTER — Other Ambulatory Visit: Payer: Self-pay

## 2021-04-10 MED ORDER — LOSARTAN POTASSIUM 25 MG PO TABS: 12.5000 mg | ORAL_TABLET | Freq: Every day | ORAL | 0 refills | Status: DC

## 2021-04-10 MED ORDER — ATORVASTATIN CALCIUM 40 MG PO TABS: 40.0000 mg | ORAL_TABLET | Freq: Every day | ORAL | 0 refills | Status: DC

## 2021-04-22 ENCOUNTER — Ambulatory Visit: Payer: Medicare Other

## 2021-05-01 ENCOUNTER — Ambulatory Visit: Payer: Medicare Other

## 2021-05-13 ENCOUNTER — Ambulatory Visit: Payer: Medicare Other

## 2021-05-15 ENCOUNTER — Ambulatory Visit: Payer: Medicare Other

## 2021-07-02 ENCOUNTER — Other Ambulatory Visit: Payer: Medicare Other

## 2021-07-02 ENCOUNTER — Ambulatory Visit: Payer: Medicare Other | Admitting: Thoracic Surgery (Cardiothoracic Vascular Surgery)

## 2021-07-03 ENCOUNTER — Ambulatory Visit
Admission: RE | Admit: 2021-07-03 | Discharge: 2021-07-03 | Disposition: A | Discharge status: Home / Self Care | Payer: Medicare Other | Source: Ambulatory Visit | Attending: Physician Assistant | Admitting: Physician Assistant

## 2021-07-03 DIAGNOSIS — I7121 Aneurysm of the ascending aorta, without rupture: Secondary | ICD-10-CM

## 2021-07-03 MED ORDER — IOPAMIDOL (ISOVUE-370) INJECTION 76%
75.0000 mL | Freq: Once | INTRAVENOUS | Status: AC | PRN
  Administered 2021-07-03: 75 mL via INTRAVENOUS

## 2021-07-17 NOTE — Progress Notes (Unsigned)
Follow-up Outpatient Visit Date: 07/19/2021  Primary Care Provider: Glean Hess, MD [address] [place] [address]  Chief Complaint: Follow-up CAD  HPI:  Mark Hooper is a 76 y.o. male with history of nonobstructive CAD noted on coronary CTA in 01/2021, thoracic and abdominal aortic aneurysms followed by Drs. Floreen Comber, and [place], hyperlipidemia, and syncope in 2019, who presents for follow-up of coronary artery disease.  He was last seen in our office in February by Christell Faith, PA, at which time he was feeling well without chest pain or shortness of breath.  He was transition from simvastatin to atorvastatin and also placed on losartan 12.5 mg daily.  Today, Mark Hooper reports feeling fairly well.  He notes sporadic shortness of breath that can happen randomly at rest or with exertion, that has been present for some time but may have worsened slightly over the last year or two.  He recently tried an inhaler prescribed by his PCP without much improvement.  He continues to play 18 holes of golf 5x/week without difficulty, though he notes more fatigue at the [place] M[place]ade of his round.  He denies chest pain, palpitations, lightheadedness, and edema.  However, he notes occasional mild leg cramps.  He is tolerating atorvastatin and losartan well.  He had f/u CTA of his thoracic aorta last month and is scheduled to see Dr. Roxan Hooper for f/u next week.  --------------------------------------------------------------------------------------------------  Past Medical History:  Diagnosis Date   Abdominal aortic aneurysm (AAA) ([facility]) 02/02/2018   Aneurysm, aortic ([facility])    Basal cell carcinoma 2016   Benign neoplasm of ascending colon    Benign neoplasm of descending colon    Benign neoplasm of sigmoid colon    CHF (congestive heart failure) ([facility])    Diverticulitis    DVT (deep venous thrombosis) ([facility])    Glaucoma    Hypercholesteremia    Polyp of sigmoid colon    Rectal  polyp    Thoracic aortic atherosclerosis ([facility]) 02/02/2018   Wears dentures    partial upper   Past Surgical History:  Procedure Laterality Date   basal cell cancer excision     CATARACT EXTRACTION     COLONOSCOPY     COLONOSCOPY WITH PROPOFOL N/A 10/27/2014   Procedure: COLONOSCOPY WITH PROPOFOL;  Surgeon: Lucilla Lame, MD;  Location: [facility];  Service: Endoscopy;  Laterality: N/A;   COLONOSCOPY WITH PROPOFOL N/A 11/25/2019   Procedure: COLONOSCOPY WITH PROPOFOL;  Surgeon: Lucilla Lame, MD;  Location: [facility];  Service: Endoscopy;  Laterality: N/A;  priority 4   POLYPECTOMY  10/27/2014   Procedure: POLYPECTOMY;  Surgeon: Lucilla Lame, MD;  Location: [facility];  Service: Endoscopy;;   POLYPECTOMY  11/25/2019   Procedure: POLYPECTOMY;  Surgeon: Lucilla Lame, MD;  Location: [facility];  Service: Endoscopy;;    Current Meds  Medication Sig   aspirin EC 81 MG tablet Take 1 tablet (81 mg total) by mouth daily. Swallow whole.   atorvastatin (LIPITOR) 40 MG tablet Take 1 tablet (40 mg total) by mouth daily.   latanoprost (XALATAN) 0.005 % ophthalmic solution 1 drop at bedtime.   losartan (COZAAR) 25 MG tablet Take 0.5 tablets (12.5 mg total) by mouth daily.    Allergies: Patient has no known allergies.  Social History   Tobacco Use   Smoking status: Former    Packs/day: 1.00    Years: 55.00    Pack years: 55.00    Types: Cigarettes  Quit date: 07/01/2020    Years since quitting: 1.0   Smokeless tobacco: Never   Tobacco comments:    smoking cessation information given  Vaping Use   Vaping Use: Never used  Substance Use Topics   Alcohol use: Not Currently   Drug use: Never    Family History  Problem Relation Age of Onset   Heart disease Father        CABG x 3   Hyperlipidemia Father    CAD Father    Prostate cancer Neg Hx    Bladder Cancer Neg Hx    Kidney cancer Neg Hx     Review of Systems: Mark Hooper notes nocturia, needed to  get up at least 3x/night to void.  He previously saw a urologist but was not interested in pursuing the surgical intervention that was recommended.  Otherwise, a 12-system review of systems was performed and was negative except as noted in the HPI.  --------------------------------------------------------------------------------------------------  Physical Exam: BP 126/74 (BP Location: Left Arm, Patient Position: Sitting, Cuff Size: Normal)   Pulse 74   Resp 19   Ht 6' (1.829 m)   Wt 232 lb 6.4 oz (105.4 kg)   SpO2 96%   BMI 31.52 kg/m   General:  NAD. Neck: No JVD or HJR. Lungs: Clear to auscultation bilaterally without wheezes or crackles. Heart: Regular rate and rhythm without murmurs, rubs, or gallops. Abdomen: Soft, nontender, nondistended. Extremities: No lower extremity edema.  EKG:  NSR w/o abnormality.  Lab Results  Component Value Date   WBC 8.6 01/21/2021   HGB 17.3 01/21/2021   HCT 49.9 01/21/2021   MCV 92 01/21/2021   PLT 277 01/21/2021    Lab Results  Component Value Date   NA 140 04/03/2021   K 4.9 04/03/2021   CL 104 04/03/2021   CO2 23 04/03/2021   BUN 14 04/03/2021   CREATININE 0.95 04/03/2021   GLUCOSE 105 (H) 04/03/2021   ALT 15 01/21/2021    Lab Results  Component Value Date   CHOL 159 01/21/2021   HDL 49 01/21/2021   LDLCALC 81 01/21/2021   TRIG 173 (H) 01/21/2021   CHOLHDL 3.2 01/21/2021    --------------------------------------------------------------------------------------------------  ASSESSMENT AND PLAN: Coronary artery disease: No angina reported.  Sporadic shortness of breath is nonspecific and may be related to diastolic dysfunction noted on prior echo or a noncardiac cause.  Coronary CTA last year showed mild-moderate, nonobstructive CAD.  We will continue aspirin and atorvastatin for now, with lipid panel and CMP today to assess response.  I advised Mark Hooper that he will likely need R/LHC if repair of his TAA is recommended  by Dr. Roxan Hooper in the future.  Thoracic and abdominal aortic aneurysms: CTA chest last month shows slight enlargement of TAA, measuring up to 5.4 cm (previously 5.1 cm) with concern for a penetrating ulcer.  Mark Hooper remains asymptomatic.  We will continue current regimen of ASA, atorvastatin, and losartan.  Mark Hooper has follow-up with Dr. Roxan Hooper next week.  AAA: Continue medical therapy with ASA, atorvastatin, and losartan; ongoing follow-up through vascular surgery.  Hyperlipidemia: Mark Hooper is tolerating atorvastatin well (he was switched from simvastatin at his last visit).  We will recheck a lipid panel and CMP today to ensure that his LDL is at goal (less than 70).  Elevated blood pressure: BP normal today.  Continue low-dose losartan.  Follow-up: Return to clinic in 1 year.  Nelva Bush, MD 07/19/2021 8:50 AM

## 2021-07-19 ENCOUNTER — Encounter: Payer: Self-pay | Admitting: Internal Medicine

## 2021-07-19 ENCOUNTER — Ambulatory Visit (INDEPENDENT_AMBULATORY_CARE_PROVIDER_SITE_OTHER): Payer: Medicare Other | Admitting: Internal Medicine

## 2021-07-19 ENCOUNTER — Other Ambulatory Visit
Admission: RE | Admit: 2021-07-19 | Discharge: 2021-07-19 | Disposition: A | Discharge status: Home / Self Care | Payer: Medicare Other | Attending: Internal Medicine | Admitting: Internal Medicine

## 2021-07-19 ENCOUNTER — Other Ambulatory Visit: Payer: Self-pay

## 2021-07-19 VITALS — BP 126/74 | HR 74 | Resp 19 | Ht 72.0 in | Wt 232.4 lb

## 2021-07-19 DIAGNOSIS — E785 Hyperlipidemia, unspecified: Secondary | ICD-10-CM | POA: Insufficient documentation

## 2021-07-19 DIAGNOSIS — Z79899 Other long term (current) drug therapy: Secondary | ICD-10-CM | POA: Diagnosis present

## 2021-07-19 DIAGNOSIS — I712 Thoracic aortic aneurysm, without rupture, unspecified: Secondary | ICD-10-CM | POA: Diagnosis not present

## 2021-07-19 DIAGNOSIS — I714 Abdominal aortic aneurysm, without rupture, unspecified: Secondary | ICD-10-CM | POA: Diagnosis not present

## 2021-07-19 DIAGNOSIS — I2510 Atherosclerotic heart disease of native coronary artery without angina pectoris: Secondary | ICD-10-CM

## 2021-07-19 LAB — LIPID PANEL
Cholesterol: 115 mg/dL (ref 0–200)
HDL: 46 mg/dL (ref >40)
LDL Cholesterol: 54 mg/dL (ref 0–99)
Total CHOL/HDL Ratio: 2.5 RATIO
Triglycerides: 77 mg/dL (ref <150)
VLDL: 15 mg/dL (ref 0–40)

## 2021-07-19 LAB — COMPREHENSIVE METABOLIC PANEL
ALT: 16 U/L (ref 0–44)
AST: 17 U/L (ref 15–41)
Albumin: 3.9 g/dL (ref 3.5–5.0)
Alkaline Phosphatase: 61 U/L (ref 38–126)
Anion gap: 5 (ref 5–15)
BUN: 17 mg/dL (ref 8–23)
CO2: 22 mmol/L (ref 22–32)
Calcium: 9.4 mg/dL (ref 8.9–10.3)
Chloride: 113 mmol/L — ABNORMAL HIGH (ref 98–111)
Creatinine, Ser: 0.83 mg/dL (ref 0.61–1.24)
GFR, Estimated: >60 mL/min (ref >60)
Glucose, Bld: 108 mg/dL — ABNORMAL HIGH (ref 70–99)
Potassium: 4.2 mmol/L (ref 3.5–5.1)
Sodium: 140 mmol/L (ref 135–145)
Total Bilirubin: 0.5 mg/dL (ref 0.3–1.2)
Total Protein: 7.5 g/dL (ref 6.5–8.1)

## 2021-07-19 NOTE — Patient Instructions (Signed)
Medication Instructions:   Your physician recommends that you continue on your current medications as directed. Please refer to the Current Medication list given to you today.  *If you need a refill on your cardiac medications before your next appointment, please call your pharmacy*   Lab Work:  Today at the medical mall at [facility] ([place], Lipid panel):  -  Please go to the [place] at [place] in at the Registration Desk: 1st desk to the right, past the screening table  If you have labs (blood work) drawn today and your tests are completely normal, you will receive your results only by: [place] (if you have MyChart) OR A paper copy in the mail If you have any lab test that is abnormal or we need to change your treatment, we will call you to review the results.   Testing/Procedures:  None ordered   Follow-Up: At [facility], you and your health needs are our priority.  As part of our continuing mission to provide you with exceptional heart care, we have created designated Provider Care Teams.  These Care Teams include your primary Cardiologist (physician) and Advanced Practice Providers (APPs -  Physician Assistants and Nurse Practitioners) who all work together to provide you with the care you need, when you need it.  We recommend signing up for the patient portal called "MyChart".  Sign up information is provided on this After Visit Summary.  MyChart is used to connect with patients for Virtual Visits (Telemedicine).  Patients are able to view lab/test results, encounter notes, upcoming appointments, etc.  Non-urgent messages can be sent to your provider as well.   To learn more about what you can do with MyChart, go to [email or web address].    Your next appointment:   1 year(s)  The format for your next appointment:   In Person  Provider:   You may see Nelva Bush, MD or one of the following Advanced Practice Providers on your designated Care  Team:   Murray Hodgkins, NP Christell Faith, PA-C Cadence Kathlen Mody, PA-C{   Important Information About Sugar

## 2021-07-22 IMAGING — CT CT CTA ABD/PEL W/CM AND/OR W/O CM
2 of 7 series · 12 of 46 positions shown, 14 images · IV contrast (iopamidol)
Comparison: 04/22/2019 chest CT angiogram. 05/20/2019 CT
abdomen/pelvis.

CLINICAL DATA: Follow-up thoracic and abdominal aortic aneurysm.
Current smoker.

EXAM:
CT ANGIOGRAPHY CHEST, ABDOMEN AND PELVIS
TECHNIQUE: Multidetector CT imaging through the chest, abdomen and pelvis was
performed using the standard protocol during bolus administration of
intravenous contrast. Multiplanar reconstructed images and MIPs were
obtained and reviewed to evaluate the vascular anatomy.
CONTRAST:  75mL 1BKYGS-WH1 IOPAMIDOL (1BKYGS-WH1) INJECTION 76%

[Series 5: cta arterial 2.00 bv36 s3 axial st · axial · arterial · 0.95mm/px · z∈[+1405,+1983]mm · 9 of 345 slices shown, 11 images]
[im 37/345  soft-tissue]
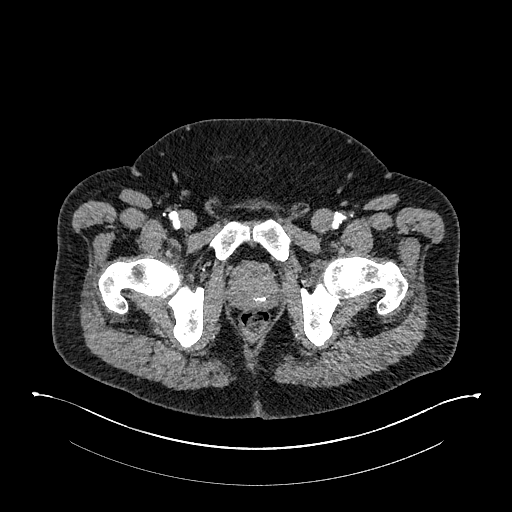
[im 37/345  bone]
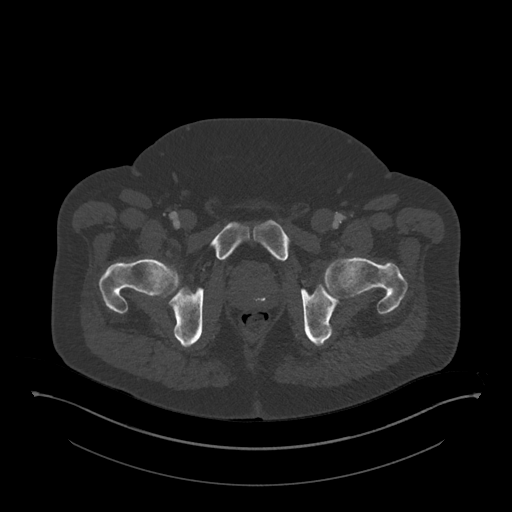
[im 73/345  soft-tissue]
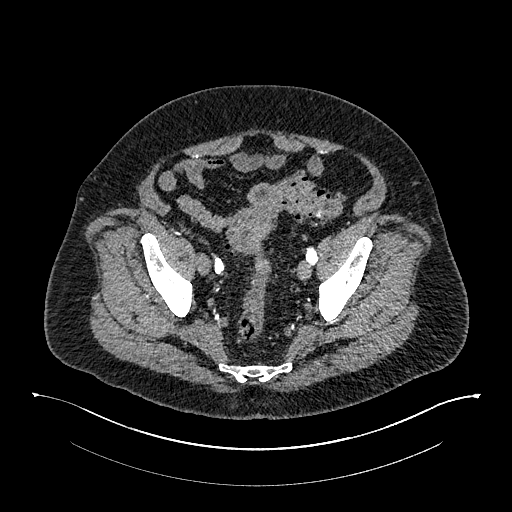
[im 109/345  soft-tissue]
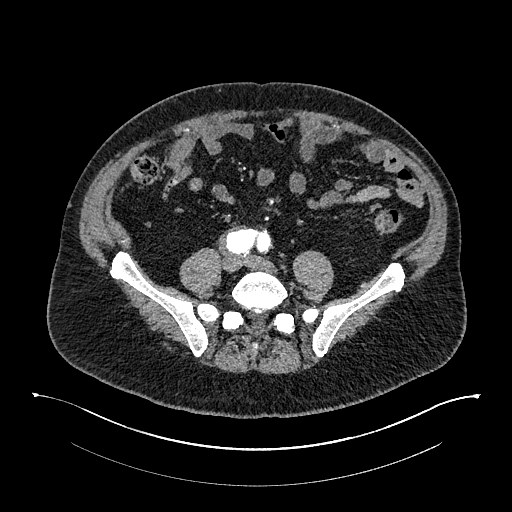
[im 145/345  soft-tissue]
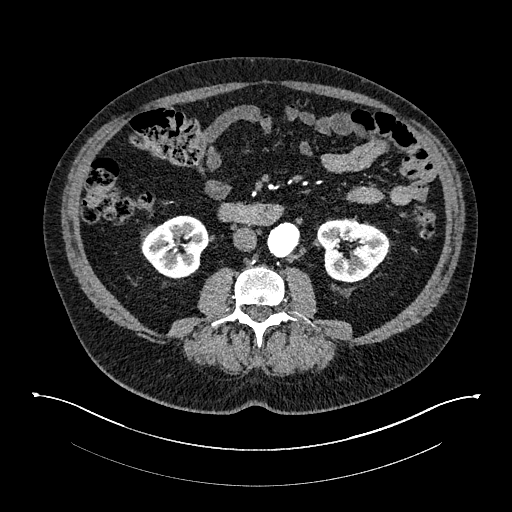
[im 182/345  soft-tissue]
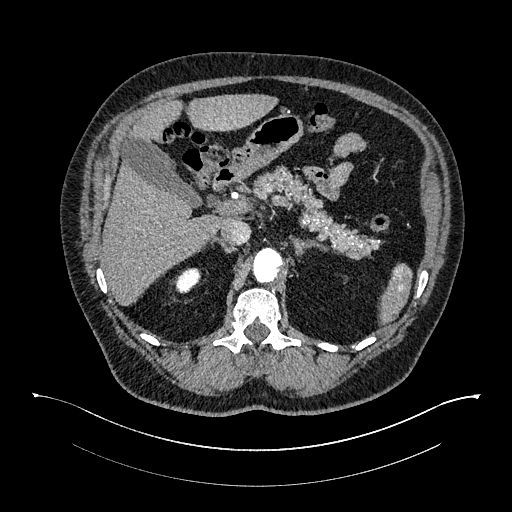
[im 218/345  soft-tissue]
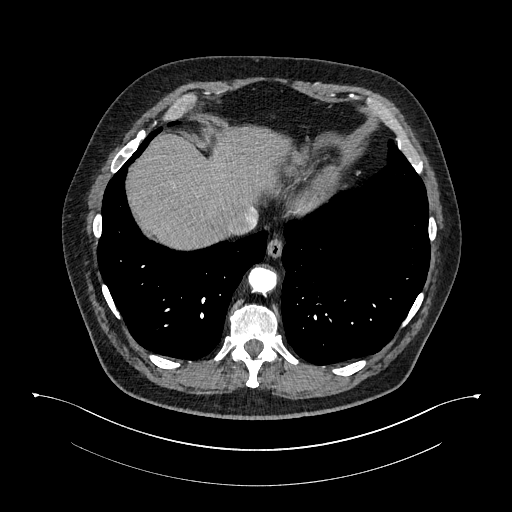
[im 254/345  soft-tissue]
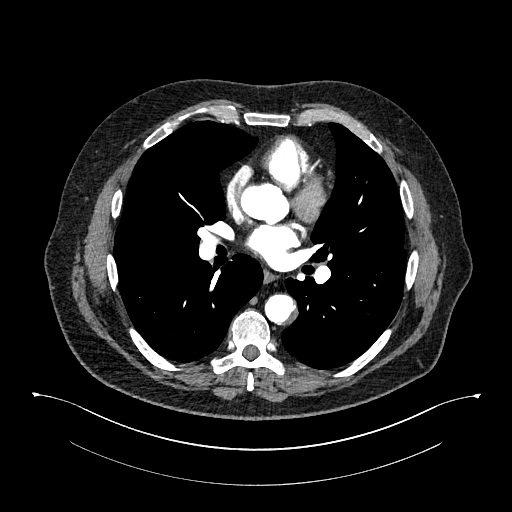
[im 290/345  soft-tissue]
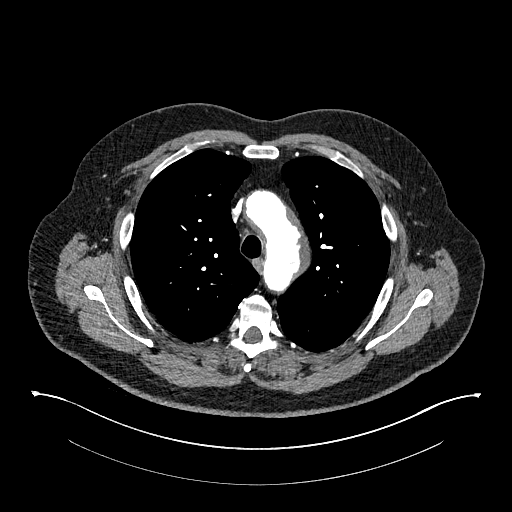
[im 326/345  soft-tissue]
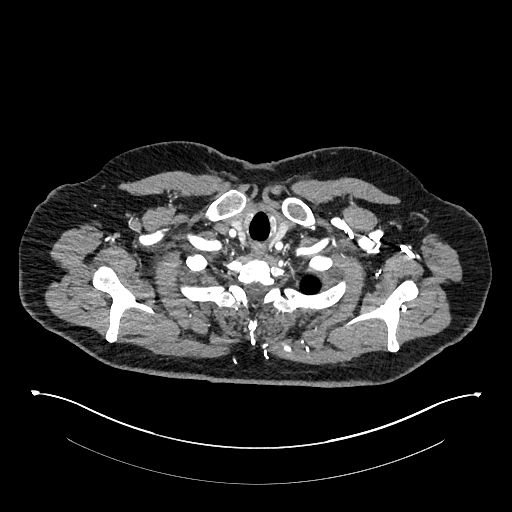
[im 326/345  bone]
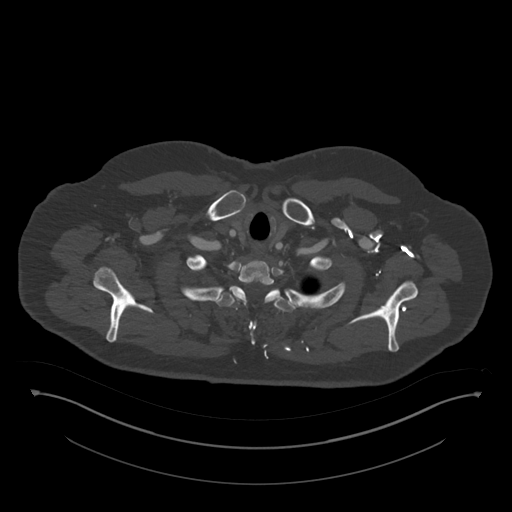

[Series 9: cta arterial 2.00 bv36 s3 cor art st · coronal · arterial · 0.91mm/px · 3 of 177 slices shown]
[im 45/177  soft-tissue]
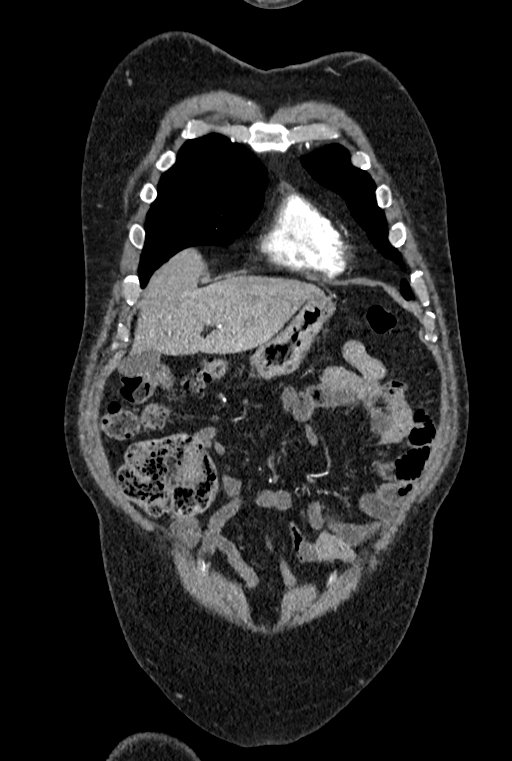
[im 89/177  soft-tissue]
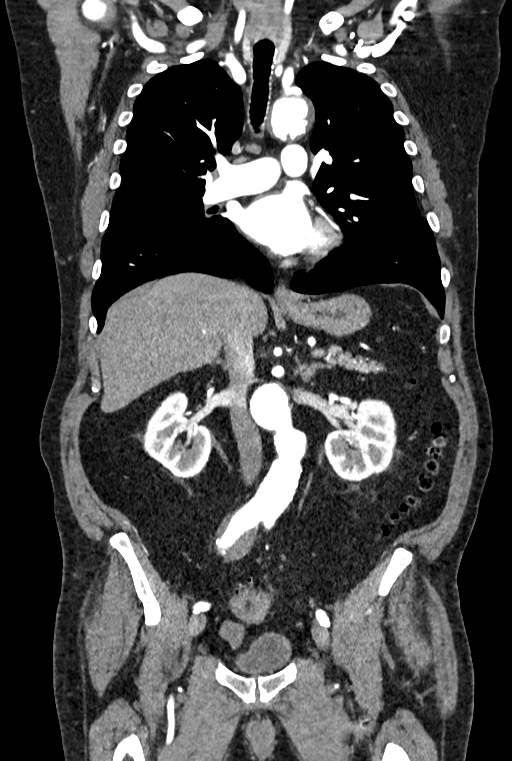
[im 133/177  soft-tissue]
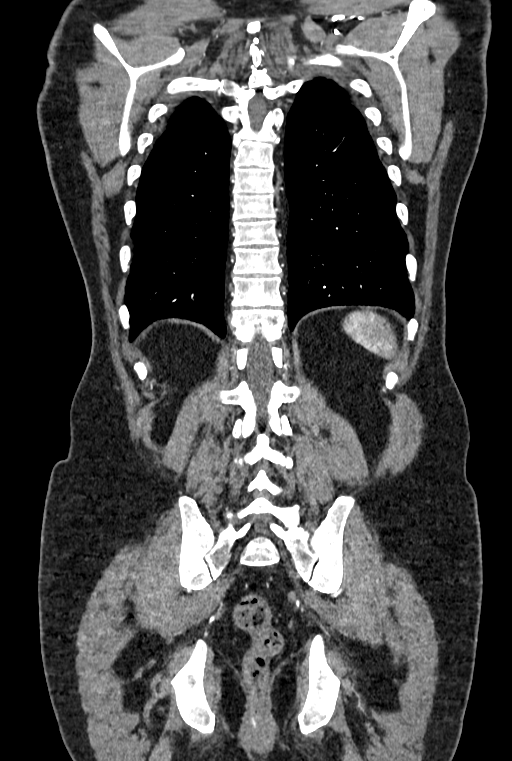

[12 of 46 positions shown; findings below may reference images not displayed]

FINDINGS: CTA CHEST FINDINGS

Cardiovascular: Normal heart size. No significant pericardial
effusion/thickening. Left anterior descending and right coronary
atherosclerosis. Atherosclerotic thoracic aorta with irregular
aneurysmal dilatation of the aortic isthmus and descending thoracic
aorta, maximum diameter 4.7 cm in the region of the aortic isthmus,
previously 4.7 cm on 04/22/2019 chest CT angiogram study using
similar measurement technique, unchanged. Ascending thoracic aortic
diameter 3.7 cm, unchanged. Aortic root diameter 4.0 cm at the level
of the sinuses of Valsalva, unchanged. No acute thoracic aortic
dissection. Aortic arch branch vessels are patent. Normal caliber
pulmonary arteries. No central pulmonary emboli.

Mediastinum/Nodes: No discrete thyroid nodules. Unremarkable
esophagus. No pathologically enlarged axillary, mediastinal or hilar
lymph nodes.

Lungs/Pleura: No pneumothorax. No pleural effusion. Mild
centrilobular emphysema with mild diffuse bronchial wall thickening.
No acute consolidative airspace disease, lung masses or significant
pulmonary nodules.

Musculoskeletal: No aggressive appearing focal osseous lesions. Mild
thoracic spondylosis.

Review of the MIP images confirms the above findings.

CTA ABDOMEN AND PELVIS FINDINGS

VASCULAR

Aorta: Atherosclerotic abdominal aorta with irregular aneurysmal
dilatation throughout the infrarenal abdominal aorta, maximum
diameter 4.7 cm, previously 4.7 cm using similar measurement
technique, unchanged. No abdominal aortic dissection.

Celiac: Patent without evidence of aneurysm, dissection, vasculitis
or significant stenosis.

SMA: Patent without evidence of aneurysm, dissection, vasculitis or
significant stenosis.

Renals: Two right and single left renal arteries are patent without
evidence of aneurysm, dissection, vasculitis, fibromuscular
dysplasia or significant stenosis.

IMA: Patent without evidence of aneurysm, dissection, vasculitis or
significant stenosis.

Inflow: Right common iliac artery 3.6 cm aneurysm, previously 3.6 cm
using similar measurement technique, stable. Left common iliac
artery 2.4 cm aneurysm, previously 2.4 cm using similar measurement
technique, stable. No high-grade stenoses.

Veins: No obvious venous abnormality within the limitations of this
arterial phase study.

Review of the MIP images confirms the above findings.

NON-VASCULAR

Hepatobiliary: Normal liver with no liver mass. Normal gallbladder
with no radiopaque cholelithiasis. No biliary ductal dilatation.

Pancreas: Punctate calcifications scattered throughout the pancreas
compatible with chronic pancreatitis. No pancreatic mass or duct
dilation.

Spleen: Normal size. No mass.

Adrenals/Urinary Tract: Normal adrenals. No hydronephrosis. No
contour deforming renal masses. Normal bladder.

Stomach/Bowel: Normal non-distended stomach. Normal caliber small
bowel with no small bowel wall thickening. Normal appendix. Moderate
diffuse colonic diverticulosis, most prominent in the sigmoid colon.
Mild-to-moderate colonic wall thickening throughout mid to distal
sigmoid colon, not substantially changed in the interval. No acute
pericolonic fat stranding.

Vascular/Lymphatic: No pathologically enlarged lymph nodes in the
abdomen or pelvis.

Reproductive: Mild prostatomegaly.

Other: No pneumoperitoneum, ascites or focal fluid collection.

Musculoskeletal: No aggressive appearing focal osseous lesions. Mild
lumbar spondylosis.

Review of the MIP images confirms the above findings.
IMPRESSION: 1. Stable irregular aneurysmal dilatation throughout the aortic
isthmus and descending thoracic aorta, maximum diameter 4.7 cm in
the region of the aortic isthmus, unchanged using similar
measurement technique. Recommend semi-annual imaging followup by CTA
or MRA and referral to cardiothoracic surgery if not already
obtained. This recommendation follows 3080
ACCF/AHA/AATS/ACR/ASA/SCA/BIN SIET/TIGER/JIM/LAKI Guidelines for the
Diagnosis and Management of Patients With Thoracic Aortic Disease.
Circulation. 3080; 121: E266-e36. Aortic aneurysm NOS (FA337-6XX.R).
2. Stable irregular aneurysmal dilatation of the entire infrarenal
abdominal aorta, maximum diameter 4.7 cm, unchanged using similar
measurement technique. Recommend follow-up CT/MR every 6 months and
vascular consultation. This recommendation follows ACR consensus
guidelines: White Paper of the ACR Incidental Findings Committee II
on Vascular Findings. [HOSPITAL] 6041; [DATE].
3. Stable bilateral common iliac artery aneurysms, right greater
than left.
4. Moderate diffuse colonic diverticulosis. Nonspecific
mild-to-moderate colonic wall thickening throughout the mid to
distal sigmoid colon, not substantially changed, presumably due to
chronic diverticular disease. Correlation with the patient's colon
cancer screening history is recommended. If screening is not
up-to-date, appropriate screening should be considered.
5. Chronic pancreatitis.
6. Mild prostatomegaly.
7. Aortic Atherosclerosis (FA337-LID.D) and Emphysema (FA337-V2F.B).

## 2021-07-23 ENCOUNTER — Telehealth: Payer: Self-pay | Admitting: Internal Medicine

## 2021-07-23 ENCOUNTER — Encounter: Payer: Self-pay | Admitting: Thoracic Surgery (Cardiothoracic Vascular Surgery)

## 2021-07-23 ENCOUNTER — Ambulatory Visit: Payer: Medicare Other | Admitting: Thoracic Surgery (Cardiothoracic Vascular Surgery)

## 2021-07-23 NOTE — Progress Notes (Unsigned)
This encounter was created in error - please disregard.

## 2021-07-23 NOTE — Telephone Encounter (Signed)
Copied from [place]. Topic: Medicare AWV >> Jul 23, 2021 10:50 AM Cher Nakai R wrote: Reason for CRM:  Left message for patient to call back and schedule Medicare Annual Wellness Visit (AWV) in office.   If unable to come into the office for AWV,  please offer to do virtually or by telephone.  Last AWV: 04/18/2020  Please schedule at anytime with [facility] Health Advisor.      30 minute appointment for Virtual or phone 45 minute appointment for in office or Initial virtual/phone  Any questions, please call me at [phone]

## 2021-07-25 ENCOUNTER — Encounter: Payer: Medicare Other | Admitting: Thoracic Surgery (Cardiothoracic Vascular Surgery)

## 2021-07-26 ENCOUNTER — Encounter: Payer: Self-pay | Admitting: Thoracic Surgery (Cardiothoracic Vascular Surgery)

## 2021-07-26 ENCOUNTER — Ambulatory Visit (INDEPENDENT_AMBULATORY_CARE_PROVIDER_SITE_OTHER): Payer: Medicare Other | Admitting: Thoracic Surgery (Cardiothoracic Vascular Surgery)

## 2021-07-26 DIAGNOSIS — I700 Atherosclerosis of aorta: Secondary | ICD-10-CM

## 2021-07-26 DIAGNOSIS — I7123 Aneurysm of the descending thoracic aorta, without rupture: Secondary | ICD-10-CM | POA: Diagnosis not present

## 2021-07-26 NOTE — Progress Notes (Signed)
[address]       [place] [address]             [phone]    This visit was conducted as a telephone visit at patient's request after in person visit canceled due to surgical emergency.  Patient location: Car, mobile #[phone] MD location: Office  HPI: Mark Hooper presents for follow-up of descending thoracic and abdominal aortic aneurysms.  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  Aneurysms were first noted after syncopal spell in 2019.  I last saw him in May 2022.  He was seen in November by Ellwood Handler.  Dr. Harrell Gave End is his cardiologist.  He is now followed by Dr. Virl Cagey of vascular surgery as well.  He is feeling well.  He denies any chest pain, pressure, tightness, or shortness of breath.  No unusual back pain.  When he saw Dr. Saunders Revel near the end of last year his Cozaar dose was cut in half to 25 mg daily.  He does monitor his blood pressure at home and it has been within the normal range.  Past Medical History:  Diagnosis Date   Abdominal aortic aneurysm (AAA) ([place]) 02/02/2018   Aneurysm, aortic (HCC)    Basal cell carcinoma 2016   Benign neoplasm of ascending colon    Benign neoplasm of descending colon    Benign neoplasm of sigmoid colon    CHF (congestive heart failure) (HCC)    Diverticulitis    DVT (deep venous thrombosis) (HCC)    Glaucoma    Hypercholesteremia    Polyp of sigmoid colon    Rectal polyp    Thoracic aortic atherosclerosis ([place]) 02/02/2018   Wears dentures    partial upper    Current Outpatient Medications  Medication Sig Dispense Refill   aspirin EC 81 MG tablet Take 1 tablet (81 mg total) by mouth daily. Swallow whole.     atorvastatin (LIPITOR) 40 MG tablet Take 40 mg by mouth daily.      latanoprost (XALATAN) 0.005 % ophthalmic solution 1 drop at bedtime.     losartan (COZAAR) 25 MG tablet Take 25 mg by mouth daily.     No current facility-administered medications for this visit.    Physical Exam There were no vitals taken for this visit. No physical exam as this is a telephone visit  Diagnostic Tests: CT ANGIOGRAPHY CHEST, ABDOMEN AND PELVIS   TECHNIQUE: Non-contrast CT of the chest was initially obtained.   Multidetector CT imaging through the chest, abdomen and pelvis was performed using the standard protocol during bolus administration of intravenous contrast. Multiplanar reconstructed images and MIPs were obtained and reviewed to evaluate the vascular anatomy.   RADIATION DOSE REDUCTION: This exam was performed according to the departmental dose-optimization program which includes automated exposure control, adjustment of the mA and/or kV according to patient size and/or use of iterative reconstruction technique.   CONTRAST:  77m ISOVUE-370 IOPAMIDOL (ISOVUE-370) INJECTION 76%   COMPARISON:  Prior CT a chest, abdomen and pelvis 12/31/2020   FINDINGS: CTA CHEST FINDINGS   Cardiovascular: Normal caliber of the aortic root and ascending thoracic aorta. Irregular penetrating ulceration beginning in the aortic isthmus results in focal aneurysmal dilation measuring up to 5.4 cm (sagittal reformatted images), slightly enlarged compared to 5.1 cm previously. Additional bilobed  aneurysmal dilation of the more distal descending thoracic aorta secondary to adjacent penetrating ulcerations measuring 4.2 and 3.6 cm respectively (coronal reformatted images) which remains unchanged. Conventional 3 vessel arch anatomy. The penetrating ulceration extends into the origin of the left subclavian artery creating a focal saccular aneurysm measuring 1.8 cm (seen well on both axial and coronal reformatted images) which remains stable.   Normal caliber of the main  pulmonary artery. No PE. The heart is normal in size. Calcifications are visible along the coronary arteries. No pericardial effusion.   Mediastinum/Nodes: Unremarkable CT appearance of the thyroid gland. No suspicious mediastinal or hilar adenopathy. No soft tissue mediastinal mass. The thoracic esophagus is unremarkable.   Lungs/Pleura: Centrilobular pulmonary emphysema. Numerous scattered small 2 mm pulmonary nodules and foci of tree-in-bud micro nodularity scattered throughout the lungs. No significant interval change compared to prior imaging. No new or suspicious pulmonary nodules. Scattered areas of peripheral ground-glass attenuation airspace opacity also present. Findings suggest COPD versus smoking related interstitial lung disease. Focal scarring along the minor fissure.   Musculoskeletal: No acute fracture or aggressive appearing lytic or blastic osseous lesion.   Review of the MIP images confirms the above findings.   CTA ABDOMEN AND PELVIS FINDINGS   VASCULAR   Aorta: Bilobed aneurysmal dilation of the juxtarenal and infrarenal abdominal aorta. Maximal diameter of the more proximal aneurysmal segment is 4.8 cm, enlarged compared to 4.4 cm previously. The infrarenal segment is grossly stable at 4.3 cm.   Celiac: Patent without evidence of aneurysm, dissection, vasculitis or significant stenosis.   SMA: Patent without evidence of aneurysm, dissection, vasculitis or significant stenosis.   Renals: 2 right-sided renal arteries, single left-sided renal artery. All arteries are patent without evidence of aneurysm, dissection, vasculitis, fibromuscular dysplasia or significant stenosis.   IMA: Patent without evidence of aneurysm, dissection, vasculitis or significant stenosis.   Inflow: Aneurysmal dilation of the right common iliac artery slightly enlarged at 4.3 cm compared to 4 cm previously. Stable ectasia of the left common iliac artery to 2 cm.   Veins: No  focal venous abnormality.   Review of the MIP images confirms the above findings.   NON-VASCULAR   Hepatobiliary: No focal liver abnormality is seen. No gallstones, gallbladder wall thickening, or biliary dilatation.   Pancreas: Numerous punctate calcifications throughout the entire pancreatic parenchyma suggesting sequelae of chronic pancreatitis. No evidence of active inflammation. No pancreatic mass lesion.   Spleen: Normal in size without focal abnormality.   Adrenals/Urinary Tract: Adrenal glands are unremarkable. Kidneys are normal, without renal calculi, focal lesion, or hydronephrosis. Bladder is unremarkable.   Stomach/Bowel: Colonic diverticular disease without CT evidence of active inflammation. No evidence of obstruction or focal bowel wall thickening. Normal appendix in the right lower quadrant. The terminal ileum is unremarkable.   Lymphatic: No suspicious lymphadenopathy.   Reproductive: Prostate is unremarkable.   Other: No abdominal wall hernia or abnormality. No abdominopelvic ascites.   Musculoskeletal: No acute fracture or aggressive appearing lytic or blastic osseous lesion.   Review of the MIP images confirms the above findings.   IMPRESSION: 1. Progressive multifocal aneurysmal disease compared to prior imaging from November of 2022. 2. Aneurysmal dilation of the aortic isthmus secondary to irregular penetrating ulceration has enlarged to 5.4 cm compared to 5.1 cm. 3. Aneurysmal dilation of the most proximal infrarenal abdominal aorta just below the renal arteries has enlarged to 4.8 cm compared to 4.4 cm. Recommend follow-up every 6 months and vascular consultation. This recommendation follows ACR consensus  guidelines: White Paper of the ACR Incidental Findings Committee II on Vascular Findings. J Am Coll Radiol 2013; 10:789-794. 4. Slight interval enlargement of right common iliac artery aneurysm to 4.3 cm compared to 4 cm previously. 5.  Stable sites of aneurysmal dilation include bilobed penetrating atherosclerotic ulcerations in the descending thoracic aorta, focal saccular aneurysm secondary to penetrating ulceration at the origin of the left subclavian artery and focal aneurysmal dilation of the more distal infrarenal abdominal aorta just below the origin of the IMA. Left common iliac artery ectasia also remains unchanged. 6. Extensive atherosclerotic vascular plaque throughout the aorta and coronary arteries. 7. 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   Signed,   Criselda Peaches, MD, [place]   [facility]   [facility]     Electronically Signed   By: Jacqulynn Cadet M.D.   On: 07/04/2021 08:49 I personally reviewed the CT images.  Slight interval growth of proximal descending thoracic aneurysm.  Dr. 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  Thoracic aortic atherosclerosis/penetrating ulcer/descending thoracic aneurysm-there appears to been some slight progression of 1 to 2 mm in the size of the proximal descending aneurysm.  No indication for surgery at this time but would not be surprised if sometime in the next year or 2 at the surgery becomes necessary.  Would likely need a combined procedure with debranching of the great vessels and stent grafting.  He understands importance of blood pressure control  and checks himself at home regularly.  Currently on Cozaar 25 mg daily.  Abdominal aortic aneurysm-increased in size.  Dr. Virl Cagey to review films  Plan: We will plan to see him back in 6 months with a repeat CT angio of chest abdomen pelvis.  I spent 10 minutes in review of records, images, and in consultation with Mark Hooper by telephone today.  [name], MD [facility] [phone]

## 2021-07-30 ENCOUNTER — Telehealth: Payer: Medicare Other | Admitting: Thoracic Surgery (Cardiothoracic Vascular Surgery)

## 2021-08-04 ENCOUNTER — Other Ambulatory Visit: Payer: Self-pay | Admitting: Physician Assistant

## 2021-08-05 NOTE — Telephone Encounter (Signed)
Please advise if ok to refill Historical provider medications.

## 2021-09-05 ENCOUNTER — Telehealth: Payer: Self-pay | Admitting: Internal Medicine

## 2021-09-05 NOTE — Telephone Encounter (Signed)
Copied from [place] [id number]. Topic: Medicare AWV >> Sep 05, 2021  1:30 PM Jae Dire wrote: Reason for CRM:  Left message for patient to call back and schedule Medicare Annual Wellness Visit (AWV) in office.   If unable to come into the office for AWV,  please offer to do virtually or by telephone.  Last AWV: 04/18/2020  Please schedule at anytime with [facility] Health Advisor.      30 minute appointment for Virtual or phone 45 minute appointment for in office or Initial virtual/phone  Any questions, please call me at [phone]

## 2021-10-09 ENCOUNTER — Ambulatory Visit (INDEPENDENT_AMBULATORY_CARE_PROVIDER_SITE_OTHER): Payer: Medicare Other | Admitting: Internal Medicine

## 2021-10-09 ENCOUNTER — Encounter: Payer: Self-pay | Admitting: Internal Medicine

## 2021-10-09 VITALS — BP 128/78 | HR 74 | Ht 72.0 in | Wt 234.0 lb

## 2021-10-09 DIAGNOSIS — I2510 Atherosclerotic heart disease of native coronary artery without angina pectoris: Secondary | ICD-10-CM | POA: Diagnosis not present

## 2021-10-09 DIAGNOSIS — H9313 Tinnitus, bilateral: Secondary | ICD-10-CM

## 2021-10-09 DIAGNOSIS — I10 Essential (primary) hypertension: Secondary | ICD-10-CM | POA: Diagnosis not present

## 2021-10-09 NOTE — Progress Notes (Signed)
Date:  10/09/2021   Name:  Mark Hooper   DOB:  January 10, 1946   MRN:  [id number]   Chief Complaint: Hypertension and Tinnitus (X 6 months, both ears, constantly, no medications tried, no ear pain )  Hypertension This is a new problem. The problem is controlled. Pertinent negatives include no chest pain, headaches, palpitations or shortness of breath. Past treatments include angiotensin blockers.   Elevated serum calcium - noted on labs in February. On repeat, calcium and PTH were normal.  Recommended repeat labs in 6 months.  He had a BMP in June which was normal.  Tinnitus - ringing in both ears started about 6 mo ago.  No pain, worse at night.  Hearing seems to be intact.  Lab Results  Component Value Date   NA 140 07/19/2021   K 4.2 07/19/2021   CO2 22 07/19/2021   GLUCOSE 108 (H) 07/19/2021   BUN 17 07/19/2021   CREATININE 0.83 07/19/2021   CALCIUM 9.4 07/19/2021   EGFR 84 04/03/2021   GFRNONAA >60 07/19/2021   Lab Results  Component Value Date   CHOL 115 07/19/2021   HDL 46 07/19/2021   LDLCALC 54 07/19/2021   TRIG 77 07/19/2021   CHOLHDL 2.5 07/19/2021   Lab Results  Component Value Date   TSH 5.860 (H) 01/21/2021   Lab Results  Component Value Date   HGBA1C 5.9 (H) 01/21/2021   Lab Results  Component Value Date   WBC 8.6 01/21/2021   HGB 17.3 01/21/2021   HCT 49.9 01/21/2021   MCV 92 01/21/2021   PLT 277 01/21/2021   Lab Results  Component Value Date   ALT 16 07/19/2021   AST 17 07/19/2021   ALKPHOS 61 07/19/2021   BILITOT 0.5 07/19/2021   No results found for: "25OHVITD2", "25OHVITD3", "VD25OH"   Review of Systems  Constitutional:  Negative for fatigue and unexpected weight change.  HENT:  Positive for tinnitus. Negative for hearing loss, nosebleeds, sinus pressure and sinus pain.   Eyes:  Negative for visual disturbance.  Respiratory:  Negative for cough, chest tightness, shortness of breath and wheezing.   Cardiovascular:  Negative for  chest pain, palpitations and leg swelling.  Gastrointestinal:  Negative for abdominal pain, constipation and diarrhea.  Neurological:  Negative for dizziness, weakness, light-headedness and headaches.  Psychiatric/Behavioral:  Negative for dysphoric mood and sleep disturbance. The patient is not nervous/anxious.     Patient Active Problem List   Diagnosis Date Noted   Essential hypertension 10/09/2021   Hypercalcemia 04/05/2021   Bilateral carotid artery stenosis 01/21/2021   Centrilobular emphysema (HCC) 01/21/2021   Fatigue 01/04/2021   Dyspnea on exertion 01/04/2021   Chest pain 01/04/2021   Thoracic aortic aneurysm without rupture (HCC) 12/31/2020   Pulmonary nodules/lesions, multiple 02/12/2018   Thoracic aortic atherosclerosis (HCC) 02/02/2018   Abdominal aortic aneurysm (AAA) without rupture 02/02/2018   Aortic dilatation (HCC) 10/14/2017   Benign prostatic hyperplasia with incomplete bladder emptying 02/05/2015   Hx of colonic polyps    Tobacco use disorder 09/01/2014   Basal cell carcinoma of nose 08/29/2014   Mixed hyperlipidemia 08/29/2014   Plantar fasciitis 08/29/2014   History of malignant melanoma of skin 09/08/2013    No Known Allergies  Past Surgical History:  Procedure Laterality Date   basal cell cancer excision     CATARACT EXTRACTION     COLONOSCOPY     COLONOSCOPY WITH PROPOFOL N/A 10/27/2014   Procedure: COLONOSCOPY WITH PROPOFOL;  Surgeon: Midge Minium,  MD;  Location: [place];  Service: Endoscopy;  Laterality: N/A;   COLONOSCOPY WITH PROPOFOL N/A 11/25/2019   Procedure: COLONOSCOPY WITH PROPOFOL;  Surgeon: Lucilla Lame, MD;  Location: [place];  Service: Endoscopy;  Laterality: N/A;  priority 4   POLYPECTOMY  10/27/2014   Procedure: POLYPECTOMY;  Surgeon: Lucilla Lame, MD;  Location: [place];  Service: Endoscopy;;   POLYPECTOMY  11/25/2019   Procedure: POLYPECTOMY;  Surgeon: Lucilla Lame, MD;  Location: [place];   Service: Endoscopy;;    Social History   Tobacco Use   Smoking status: Former    Packs/day: 1.00    Years: 55.00    Total pack years: 55.00    Types: Cigarettes    Quit date: 07/01/2020    Years since quitting: 1.2   Smokeless tobacco: Never   Tobacco comments:    smoking cessation information given  Vaping Use   Vaping Use: Never used  Substance Use Topics   Alcohol use: Not Currently   Drug use: Never     Medication list has been reviewed and updated.  Current Meds  Medication Sig   aspirin EC 81 MG tablet Take 1 tablet (81 mg total) by mouth daily. Swallow whole.   atorvastatin (LIPITOR) 40 MG tablet TAKE 1 TABLET DAILY (STOPPING SIMVASTATIN)   latanoprost (XALATAN) 0.005 % ophthalmic solution 1 drop at bedtime.   losartan (COZAAR) 25 MG tablet TAKE ONE-HALF (1/2) TABLET DAILY       10/09/2021    9:04 AM 04/05/2021    9:27 AM 01/21/2021   10:02 AM 01/18/2020   11:03 AM  GAD 7 : Generalized Anxiety Score  Nervous, Anxious, on Edge 0 0 0 0  Control/stop worrying 0 0 0 0  Worry too much - different things 0 0 0 0  Trouble relaxing 0 0 0 0  Restless 0 0 0 0  Easily annoyed or irritable 0 0 0 0  Afraid - awful might happen 0 0 0 0  Total GAD 7 Score 0 0 0 0  Anxiety Difficulty Not difficult at all Not difficult at all Not difficult at all        10/09/2021    9:04 AM 04/05/2021    9:27 AM 01/21/2021   10:02 AM  Depression screen PHQ 2/9  Decreased Interest 0 0 0  Down, Depressed, Hopeless 0 0 0  PHQ - 2 Score 0 0 0  Altered sleeping 0 0 0  Tired, decreased energy 0 0 0  Change in appetite 0 0 0  Feeling bad or failure about yourself  0 0 0  Trouble concentrating 0 0 0  Moving slowly or fidgety/restless 0 0 0  Suicidal thoughts 0 0 0  PHQ-9 Score 0 0 0  Difficult doing work/chores Not difficult at all Not difficult at all Not difficult at all    BP Readings from Last 3 Encounters:  10/09/21 128/78  07/19/21 126/74  04/05/21 122/70    Physical  Exam Vitals and nursing note reviewed.  Constitutional:      General: He is not in acute distress.    Appearance: Normal appearance. He is well-developed.  HENT:     Head: Normocephalic and atraumatic.     Right Ear: Tympanic membrane, ear canal and external ear normal.     Left Ear: Tympanic membrane, ear canal and external ear normal.     Nose:     Right Sinus: No maxillary sinus tenderness.  Left Sinus: No maxillary sinus tenderness.  Neck:     Vascular: No carotid bruit.  Cardiovascular:     Rate and Rhythm: Normal rate and regular rhythm.     Heart sounds: No murmur heard. Pulmonary:     Effort: Pulmonary effort is normal. No respiratory distress.     Breath sounds: No wheezing or rhonchi.  Musculoskeletal:     Cervical back: Normal range of motion.     Right lower leg: No edema.     Left lower leg: No edema.  Lymphadenopathy:     Cervical: No cervical adenopathy.  Skin:    General: Skin is warm and dry.     Findings: No rash.  Neurological:     Mental Status: He is alert and oriented to person, place, and time.  Psychiatric:        Mood and Affect: Mood normal.        Behavior: Behavior normal.     Wt Readings from Last 3 Encounters:  10/09/21 234 lb (106.1 kg)  07/19/21 232 lb 6.4 oz (105.4 kg)  04/05/21 233 lb (105.7 kg)    BP 128/78 (BP Location: Left Arm, Cuff Size: Large)   Pulse 74   Ht 6' (1.829 m)   Wt 234 lb (106.1 kg)   SpO2 95%   BMI 31.74 kg/m   Assessment and Plan: 1. Essential hypertension Clinically stable exam with well controlled BP. Tolerating medications without side effects at this time. Pt to continue current regimen and low sodium diet; benefits of regular exercise as able discussed.  2. Hypercalcemia Recent BMP was normal. Previous PTH was normal.  3. Tinnitus aurium, bilateral No obvious cause on exam or by history - Ambulatory referral to ENT   Partially dictated using Dragon software. Any errors are  unintentional.  Halina Maidens, MD [facility]  10/09/2021

## 2021-10-30 ENCOUNTER — Ambulatory Visit: Payer: Self-pay

## 2021-10-30 NOTE — Patient Outreach (Signed)
  Care Coordination   Initial Visit Note   10/30/2021 Name: Mark Hooper MRN: [id number] DOB: [birth year]  Mark Hooper is a 76 y.o. year old male who sees Glean Hess, MD for primary care. I spoke with  [name] by phone today.  What matters to the patients health and wellness today?  Help for the ringing in his ears he has. Waiting to see ENT specialist     Goals Addressed               This Visit's Progress     RNCM: "I stil have ringing in my ears" (pt-stated)        Care Coordination Interventions: Evaluation of current treatment plan related to ringing in his ears and patient's adherence to plan as established by provider Advised patient to call the office for worsening ringing in his ears or new issues related to his chronic health conditions Provided education to patient re: the care coordination program and the availability of RNCM to help with questions, concerns, or educational needs  Provided patient with epleys maneuver  educational materials related to possible help with ringing in the ears Reviewed scheduled/upcoming provider appointments including patient states he sees the ENT coming up, could not remember exact date. Will see pcp again on 02-14-2022 at 0940 am Discussed plans with patient for ongoing care management follow up and provided patient with direct contact information for care management team Advised patient to discuss changes in his chronic conditions with provider Screening for signs and symptoms of depression related to chronic disease state  Assessed social determinant of health barriers Review of the care coordination program and the availability of the [facility] for assistance with questions, concerns, or new educational needs.            SDOH assessments and interventions completed:  Yes  SDOH Interventions Today    Flowsheet Row Most Recent Value  SDOH Interventions   Utilities Interventions Intervention Not Indicated  Stress  Interventions Intervention Not Indicated  Social Connections Interventions Intervention Not Indicated        Care Coordination Interventions Activated:  Yes  Care Coordination Interventions:  Yes, provided   Follow up plan: No further intervention required.   Encounter Outcome:  Pt. Visit Completed   Noreene Larsson RN, MSN, [facility] Mobile: [phone]

## 2021-10-30 NOTE — Patient Instructions (Signed)
Visit Information  Thank you for taking time to visit with me today. Please don't hesitate to contact me if I can be of assistance to you.   Following are the goals we discussed today:   Goals Addressed               This Visit's Progress     RNCM: "I stil have ringing in my ears" (pt-stated)        Care Coordination Interventions: Evaluation of current treatment plan related to ringing in his ears and patient's adherence to plan as established by provider Advised patient to call the office for worsening ringing in his ears or new issues related to his chronic health conditions Provided education to patient re: the care coordination program and the availability of RNCM to help with questions, concerns, or educational needs  Provided patient with epleys maneuver  educational materials related to possible help with ringing in the ears Reviewed scheduled/upcoming provider appointments including patient states he sees the ENT coming up, could not remember exact date. Will see pcp again on 02-14-2022 at 0940 am Discussed plans with patient for ongoing care management follow up and provided patient with direct contact information for care management team Advised patient to discuss changes in his chronic conditions with provider Screening for signs and symptoms of depression related to chronic disease state  Assessed social determinant of health barriers Review of the care coordination program and the availability of the [facility] for assistance with questions, concerns, or new educational needs.              Please call the care guide team at [phone] if you need to schedule an appointment.   If you are experiencing a Mental Health or [place] or need someone to talk to, please call the Suicide and Crisis Lifeline: 988 call the [place] National Suicide Prevention Lifeline: 4380765181 or TTY: 206-757-5138 TTY 757-700-3243) to talk to a trained counselor call  1-800-273-TALK (toll free, 24 hour hotline)  Patient verbalizes understanding of instructions and care plan provided today and agrees to view in [place]. Active MyChart status and patient understanding of how to access instructions and care plan via MyChart confirmed with patient.     No further follow up required: the patient knows to call the [facility] for questions, concerns, or educational needs.    Noreene Larsson RN, MSN, [facility] Mobile: [phone]    How to Perform the Epley Maneuver The Epley maneuver is an exercise that relieves symptoms of vertigo. Vertigo is the feeling that you or your surroundings are moving when they are not. When you feel vertigo, you may feel like the room is spinning and may have trouble walking. The Epley maneuver is used for a type of vertigo caused by a calcium deposit in a part of the inner ear. The maneuver involves changing head positions to help the deposit move out of the area. You can do this maneuver at home whenever you have symptoms of vertigo. You can repeat it in 24 hours if your vertigo has not gone away. Even though the Epley maneuver may relieve your vertigo for a few weeks, it is possible that your symptoms will return. This maneuver relieves vertigo, but it does not relieve dizziness. What are the risks? If it is done correctly, the Epley maneuver is considered safe. Sometimes it can lead to dizziness or nausea that goes away after a short time. If you develop other  symptoms--such as changes in vision, weakness, or numbness--stop doing the maneuver and call your health care provider. Supplies needed: A bed or table. A pillow. How to do the Epley maneuver     Sit on the edge of a bed or table with your back straight and your legs extended or hanging over the edge of the bed or table. Turn your head halfway toward the affected ear or side as told by your health care provider. Lie backward  quickly with your head turned until you are lying flat on your back. Your head should dangle (head-hanging position). You may want to position a pillow under your shoulders. Hold this position for at least 30 seconds. If you feel dizzy or have symptoms of vertigo, continue to hold the position until the symptoms stop. Turn your head to the opposite direction until your unaffected ear is facing down. Your head should continue to dangle. Hold this position for at least 30 seconds. If you feel dizzy or have symptoms of vertigo, continue to hold the position until the symptoms stop. Turn your whole body to the same side as your head so that you are positioned on your side. Your head will now be nearly facedown and no longer needs to dangle. Hold for at least 30 seconds. If you feel dizzy or have symptoms of vertigo, continue to hold the position until the symptoms stop. Sit back up. You can repeat the maneuver in 24 hours if your vertigo does not go away. Follow these instructions at home: For 24 hours after doing the Epley maneuver: Keep your head in an upright position. When lying down to sleep or rest, keep your head raised (elevated) with two or more pillows. Avoid excessive neck movements. Activity Do not drive or use machinery if you feel dizzy. After doing the Epley maneuver, return to your normal activities as told by your health care provider. Ask your health care provider what activities are safe for you. General instructions Drink enough fluid to keep your urine pale yellow. Do not drink alcohol. Take over-the-counter and prescription medicines only as told by your health care provider. Keep all follow-up visits. This is important. Preventing vertigo symptoms Ask your health care provider if there is anything you should do at home to prevent vertigo. He or she may recommend that you: Keep your head elevated with two or more pillows while you sleep. Do not sleep on the side of your  affected ear. Get up slowly from bed. Avoid sudden movements during the day. Avoid extreme head positions or movement, such as looking up or bending over. Contact a health care provider if: Your vertigo gets worse. You have other symptoms, including: Nausea. Vomiting. Headache. Get help right away if you: Have vision changes. Have a headache or neck pain that is severe or getting worse. Cannot stop vomiting. Have new numbness or weakness in any part of your body. These symptoms may represent a serious problem that is an emergency. Do not wait to see if the symptoms will go away. Get medical help right away. Call your local emergency services (911 in the [place] Do not drive yourself to the hospital. Summary Vertigo is the feeling that you or your surroundings are moving when they are not. The Epley maneuver is an exercise that relieves symptoms of vertigo. If the Epley maneuver is done correctly, it is considered safe. This information is not intended to replace advice given to you by your health care provider. Make sure you discuss  any questions you have with your health care provider. Document Revised: 01/04/2020 Document Reviewed: 01/04/2020 Elsevier Patient Education  [place].

## 2021-12-24 ENCOUNTER — Other Ambulatory Visit: Payer: Self-pay | Admitting: Thoracic Surgery (Cardiothoracic Vascular Surgery)

## 2021-12-24 DIAGNOSIS — I700 Atherosclerosis of aorta: Secondary | ICD-10-CM

## 2021-12-24 DIAGNOSIS — I7123 Aneurysm of the descending thoracic aorta, without rupture: Secondary | ICD-10-CM

## 2022-02-14 ENCOUNTER — Encounter: Payer: Self-pay | Admitting: Internal Medicine

## 2022-02-14 ENCOUNTER — Ambulatory Visit (INDEPENDENT_AMBULATORY_CARE_PROVIDER_SITE_OTHER): Payer: Medicare Other | Admitting: Internal Medicine

## 2022-02-14 VITALS — BP 112/66 | HR 85 | Ht 72.0 in | Wt 241.0 lb

## 2022-02-14 DIAGNOSIS — Z23 Encounter for immunization: Secondary | ICD-10-CM

## 2022-02-14 DIAGNOSIS — R7303 Prediabetes: Secondary | ICD-10-CM | POA: Diagnosis not present

## 2022-02-14 DIAGNOSIS — I2510 Atherosclerotic heart disease of native coronary artery without angina pectoris: Secondary | ICD-10-CM

## 2022-02-14 DIAGNOSIS — I10 Essential (primary) hypertension: Secondary | ICD-10-CM

## 2022-02-14 DIAGNOSIS — E782 Mixed hyperlipidemia: Secondary | ICD-10-CM

## 2022-02-14 DIAGNOSIS — I712 Thoracic aortic aneurysm, without rupture, unspecified: Secondary | ICD-10-CM

## 2022-02-14 DIAGNOSIS — J432 Centrilobular emphysema: Secondary | ICD-10-CM

## 2022-02-14 LAB — POCT URINALYSIS DIPSTICK
Bilirubin, UA: NEGATIVE
Blood, UA: NEGATIVE
Glucose, UA: NEGATIVE
Ketones, UA: NEGATIVE
Leukocytes, UA: NEGATIVE
Nitrite, UA: NEGATIVE
Protein, UA: NEGATIVE
Spec Grav, UA: 1.025 (ref 1.010–1.025)
Urobilinogen, UA: 0.2 E.U./dL
pH, UA: 6 (ref 5.0–8.0)

## 2022-02-14 NOTE — Assessment & Plan Note (Signed)
Clinically stable exam with well controlled BP. Tolerating medications without side effects at this time. Pt to continue current regimen and low sodium diet; benefits of regular exercise as able discussed.

## 2022-02-14 NOTE — Progress Notes (Signed)
Date:  02/14/2022   Name:  Mark Hooper   DOB:  Apr 04, 1945   MRN:  [id number]   Chief Complaint: Annual Exam Mark Hooper is a 76 y.o. male who presents today for his Complete Annual Exam. He feels well. He reports golfing 5 days weekly. He reports he is sleeping fairly well.   Colonoscopy: 11/2019 repeat 56 yrs  Immunization History  Administered Date(s) Administered   Influenza, High Dose Seasonal PF 10/26/2017   Influenza-Unspecified 12/03/2018, 11/19/2019, 11/17/2020   PFIZER(Purple Top)SARS-COV-2 Vaccination 03/29/2019, 04/19/2019, 11/28/2019   Pneumococcal Conjugate-13 02/05/2015   Pneumococcal Polysaccharide-23 01/05/2012   Zoster Recombinat (Shingrix) 12/10/2018, 02/15/2019   Health Maintenance Due  Topic Date Due   DTaP/Tdap/Td (1 - Tdap) Never done   Medicare Annual Wellness (AWV)  04/18/2021   INFLUENZA VACCINE  09/17/2021    Lab Results  Component Value Date   PSA1 2.4 01/21/2021   PSA1 2.4 01/18/2020   PSA1 2.0 12/10/2018   PSA 2.0 08/02/2013    Hypertension This is a chronic problem. The problem is controlled. Pertinent negatives include no chest pain, headaches, palpitations or shortness of breath. Past treatments include angiotensin blockers. The current treatment provides significant improvement. Hypertensive end-organ damage includes CAD/MI. There is no history of kidney disease or CVA.  Hyperlipidemia This is a chronic problem. The problem is controlled. Pertinent negatives include no chest pain, myalgias or shortness of breath. Current antihyperlipidemic treatment includes statins. The current treatment provides significant improvement of lipids.  Diabetes He presents for his follow-up diabetic visit. Diabetes type: prediabetes. Pertinent negatives for hypoglycemia include no dizziness, headaches or nervousness/anxiousness. Pertinent negatives for diabetes include no chest pain and no fatigue. Symptoms are stable. Pertinent negatives for diabetic  complications include no CVA. Current diabetic treatment includes diet.  COPD - quit smoking last year.  No significant shortness of breath, he was able to come up the stairs today.  He plays golf frequently.  CT in May showed stable appearance of lungs and small nodules.  Lab Results  Component Value Date   NA 140 07/19/2021   K 4.2 07/19/2021   CO2 22 07/19/2021   GLUCOSE 108 (H) 07/19/2021   BUN 17 07/19/2021   CREATININE 0.83 07/19/2021   CALCIUM 9.4 07/19/2021   EGFR 84 04/03/2021   GFRNONAA >60 07/19/2021   Lab Results  Component Value Date   CHOL 115 07/19/2021   HDL 46 07/19/2021   LDLCALC 54 07/19/2021   TRIG 77 07/19/2021   CHOLHDL 2.5 07/19/2021   Lab Results  Component Value Date   TSH 5.860 (H) 01/21/2021   Lab Results  Component Value Date   HGBA1C 5.9 (H) 01/21/2021   Lab Results  Component Value Date   WBC 8.6 01/21/2021   HGB 17.3 01/21/2021   HCT 49.9 01/21/2021   MCV 92 01/21/2021   PLT 277 01/21/2021   Lab Results  Component Value Date   ALT 16 07/19/2021   AST 17 07/19/2021   ALKPHOS 61 07/19/2021   BILITOT 0.5 07/19/2021   No results found for: "25OHVITD2", "25OHVITD3", "VD25OH"   Review of Systems  Constitutional:  Negative for appetite change, chills, diaphoresis, fatigue and unexpected weight change.  HENT:  Negative for hearing loss, tinnitus, trouble swallowing and voice change.   Eyes:  Negative for visual disturbance.  Respiratory:  Negative for choking, shortness of breath and wheezing.   Cardiovascular:  Negative for chest pain, palpitations and leg swelling.  Gastrointestinal:  Negative for abdominal  pain, blood in stool, constipation and diarrhea.  Genitourinary:  Positive for frequency. Negative for difficulty urinating and dysuria.  Musculoskeletal:  Positive for arthralgias (in both thumbs). Negative for back pain and myalgias.  Skin:  Negative for color change and rash.  Neurological:  Negative for dizziness, syncope and  headaches.  Hematological:  Negative for adenopathy.  Psychiatric/Behavioral:  Negative for dysphoric mood and sleep disturbance. The patient is not nervous/anxious.     Patient Active Problem List   Diagnosis Date Noted   Prediabetes 02/14/2022   Essential hypertension 10/09/2021   Hypercalcemia 04/05/2021   Bilateral carotid artery stenosis 01/21/2021   Centrilobular emphysema ([place]) 01/21/2021   Fatigue 01/04/2021   Dyspnea on exertion 01/04/2021   Chest pain 01/04/2021   Thoracic aortic aneurysm without rupture ([place]) 12/31/2020   Pulmonary nodules/lesions, multiple 02/12/2018   Thoracic aortic atherosclerosis ([place]) 02/02/2018   Abdominal aortic aneurysm (AAA) without rupture 02/02/2018   Aortic dilatation ([place]) 10/14/2017   Benign prostatic hyperplasia with incomplete bladder emptying 02/05/2015   Hx of colonic polyps    Tobacco use disorder 09/01/2014   Basal cell carcinoma of nose 08/29/2014   Mixed hyperlipidemia 08/29/2014   Plantar fasciitis 08/29/2014   History of malignant melanoma of skin 09/08/2013    No Known Allergies  Past Surgical History:  Procedure Laterality Date   basal cell cancer excision     CATARACT EXTRACTION     COLONOSCOPY     COLONOSCOPY WITH PROPOFOL N/A 10/27/2014   Procedure: COLONOSCOPY WITH PROPOFOL;  Surgeon: Lucilla Lame, MD;  Location: [facility];  Service: Endoscopy;  Laterality: N/A;   COLONOSCOPY WITH PROPOFOL N/A 11/25/2019   Procedure: COLONOSCOPY WITH PROPOFOL;  Surgeon: Lucilla Lame, MD;  Location: [facility];  Service: Endoscopy;  Laterality: N/A;  priority 4   POLYPECTOMY  10/27/2014   Procedure: POLYPECTOMY;  Surgeon: Lucilla Lame, MD;  Location: [facility];  Service: Endoscopy;;   POLYPECTOMY  11/25/2019   Procedure: POLYPECTOMY;  Surgeon: Lucilla Lame, MD;  Location: [facility];  Service: Endoscopy;;    Social History   Tobacco Use   Smoking status: Former    Packs/day: 1.00    Years: 55.00     Total pack years: 55.00    Types: Cigarettes    Quit date: 07/01/2020    Years since quitting: 1.6   Smokeless tobacco: Never   Tobacco comments:    smoking cessation information given  Vaping Use   Vaping Use: Never used  Substance Use Topics   Alcohol use: Not Currently   Drug use: Never     Medication list has been reviewed and updated.  Current Meds  Medication Sig   aspirin EC 81 MG tablet Take 1 tablet (81 mg total) by mouth daily. Swallow whole.   atorvastatin (LIPITOR) 40 MG tablet TAKE 1 TABLET DAILY (STOPPING SIMVASTATIN)   latanoprost (XALATAN) 0.005 % ophthalmic solution 1 drop at bedtime.   losartan (COZAAR) 25 MG tablet TAKE ONE-HALF (1/2) TABLET DAILY       02/14/2022    9:39 AM 10/09/2021    9:04 AM 04/05/2021    9:27 AM 01/21/2021   10:02 AM  GAD 7 : Generalized Anxiety Score  Nervous, Anxious, on Edge 0 0 0 0  Control/stop worrying 0 0 0 0  Worry too much - different things 0 0 0 0  Trouble relaxing 0 0 0 0  Restless 0 0 0 0  Easily annoyed or irritable 0 0 0 0  Afraid - awful might happen 0 0 0 0  Total GAD 7 Score 0 0 0 0  Anxiety Difficulty Not difficult at all Not difficult at all Not difficult at all Not difficult at all       02/14/2022    9:39 AM 10/09/2021    9:04 AM 04/05/2021    9:27 AM  Depression screen PHQ 2/9  Decreased Interest 0 0 0  Down, Depressed, Hopeless 0 0 0  PHQ - 2 Score 0 0 0  Altered sleeping 1 0 0  Tired, decreased energy 1 0 0  Change in appetite 0 0 0  Feeling bad or failure about yourself  0 0 0  Trouble concentrating 0 0 0  Moving slowly or fidgety/restless 0 0 0  Suicidal thoughts 0 0 0  PHQ-9 Score 2 0 0  Difficult doing work/chores Not difficult at all Not difficult at all Not difficult at all    BP Readings from Last 3 Encounters:  02/14/22 112/66  10/09/21 128/78  07/19/21 126/74    Physical Exam Vitals and nursing note reviewed.  Constitutional:      Appearance: Normal appearance. He is  well-developed.  HENT:     Head: Normocephalic.     Right Ear: Tympanic membrane, ear canal and external ear normal.     Left Ear: Tympanic membrane, ear canal and external ear normal.     Nose: Nose normal.  Eyes:     Conjunctiva/sclera: Conjunctivae normal.     Pupils: Pupils are equal, round, and reactive to light.  Neck:     Thyroid: No thyromegaly.     Vascular: No carotid bruit.  Cardiovascular:     Rate and Rhythm: Normal rate and regular rhythm.     Pulses:          Posterior tibial pulses are 1+ on the right side and 1+ on the left side.     Heart sounds: Normal heart sounds.  Pulmonary:     Effort: Pulmonary effort is normal.     Breath sounds: Normal breath sounds. No wheezing.  Chest:  Breasts:    Right: No mass.     Left: No mass.  Abdominal:     General: Bowel sounds are normal.     Palpations: Abdomen is soft.     Tenderness: There is no abdominal tenderness.  Musculoskeletal:        General: Normal range of motion.     Cervical back: Normal range of motion and neck supple.     Right lower leg: No edema.     Left lower leg: No edema.  Lymphadenopathy:     Cervical: No cervical adenopathy.  Skin:    General: Skin is warm and dry.  Neurological:     Mental Status: He is alert and oriented to person, place, and time.     Deep Tendon Reflexes: Reflexes are normal and symmetric.  Psychiatric:        Attention and Perception: Attention normal.        Mood and Affect: Mood normal.        Thought Content: Thought content normal.     Wt Readings from Last 3 Encounters:  02/14/22 241 lb (109.3 kg)  10/09/21 234 lb (106.1 kg)  07/19/21 232 lb 6.4 oz (105.4 kg)    BP 112/66   Pulse 85   Ht 6' (1.829 m)   Wt 241 lb (109.3 kg)   SpO2 96%   BMI 32.69 kg/m  Assessment and Plan: Problem List Items Addressed This Visit       Cardiovascular and Mediastinum   Essential hypertension - Primary (Chronic)    Clinically stable exam with well controlled  BP. Tolerating medications without side effects at this time. Pt to continue current regimen and low sodium diet; benefits of regular exercise as able discussed.       Relevant Orders   CBC with Differential/Platelet   Comprehensive metabolic panel   TSH   POCT urinalysis dipstick   Thoracic aortic aneurysm without rupture (H[facility]) (Chronic)    Followed by CVTS Having regular screenings scheduled for next month        Respiratory   Centrilobular emphysema ([place]) (Chronic)    He denies limitations to regular activities CT 06/2021 findings Lungs/Pleura: Centrilobular pulmonary emphysema. Numerous scattered small 2 mm pulmonary nodules and foci of tree-in-bud micro nodularity scattered throughout the lungs. No significant interval change compared to prior imaging. No new or suspicious pulmonary nodules. Scattered areas of peripheral ground-glass attenuation airspace opacity also present. Findings suggest COPD versus smoking related interstitial lung disease. Focal scarring along the minor fissure.        Other   Mixed hyperlipidemia (Chronic)    Tolerating statin medication without side effects at this time LDL is at goal of < 70 on current dose Continue same therapy without change at this time.       Relevant Orders   Lipid panel   Prediabetes (Chronic)    Rec low carb diet with exercise and weight management He has gained a few lbs since last visit       Relevant Orders   Comprehensive metabolic panel   Hemoglobin A1c   Other Visit Diagnoses     Need for vaccination for pneumococcus       Relevant Orders   Pneumococcal conjugate vaccine 20-valent        Partially dictated using Dragon software. Any errors are unintentional.  Halina Maidens, MD [facility]  02/14/2022

## 2022-02-14 NOTE — Assessment & Plan Note (Addendum)
Rec low carb diet with exercise and weight management He has gained a few lbs since last visit

## 2022-02-14 NOTE — Assessment & Plan Note (Addendum)
Followed by [facility] Having regular screenings scheduled for next month

## 2022-02-14 NOTE — Assessment & Plan Note (Signed)
Tolerating statin medication without side effects at this time LDL is at goal of < 70 on current dose Continue same therapy without change at this time.

## 2022-02-14 NOTE — Assessment & Plan Note (Addendum)
He denies limitations to regular activities CT 06/2021 findings Lungs/Pleura: Centrilobular pulmonary emphysema. Numerous scattered small 2 mm pulmonary nodules and foci of tree-in-bud micro nodularity scattered throughout the lungs. No significant interval change compared to prior imaging. No new or suspicious pulmonary nodules. Scattered areas of peripheral ground-glass attenuation airspace opacity also present. Findings suggest COPD versus smoking related interstitial lung disease. Focal scarring along the minor fissure.

## 2022-02-15 LAB — CBC WITH DIFFERENTIAL/PLATELET
Basophils Absolute: 0.1 10*3/uL (ref 0.0–0.2)
Basos: 1 %
EOS (ABSOLUTE): 0.2 10*3/uL (ref 0.0–0.4)
Eos: 2 %
Hematocrit: 46.1 % (ref 37.5–51.0)
Hemoglobin: 15.4 g/dL (ref 13.0–17.7)
Immature Grans (Abs): 0 10*3/uL (ref 0.0–0.1)
Immature Granulocytes: 0 %
Lymphocytes Absolute: 2.1 10*3/uL (ref 0.7–3.1)
Lymphs: 26 %
MCH: 31.2 pg (ref 26.6–33.0)
MCHC: 33.4 g/dL (ref 31.5–35.7)
MCV: 94 fL (ref 79–97)
Monocytes Absolute: 0.6 10*3/uL (ref 0.1–0.9)
Monocytes: 7 %
Neutrophils Absolute: 5 10*3/uL (ref 1.4–7.0)
Neutrophils: 64 %
Platelets: 281 10*3/uL (ref 150–450)
RBC: 4.93 x10E6/uL (ref 4.14–5.80)
RDW: 12.4 % (ref 11.6–15.4)
WBC: 7.9 10*3/uL (ref 3.4–10.8)

## 2022-02-15 LAB — COMPREHENSIVE METABOLIC PANEL
ALT: 19 IU/L (ref 0–44)
AST: 17 IU/L (ref 0–40)
Albumin/Globulin Ratio: 1.5 (ref 1.2–2.2)
Albumin: 4.2 g/dL (ref 3.8–4.8)
Alkaline Phosphatase: 83 IU/L (ref 44–121)
BUN/Creatinine Ratio: 18 (ref 10–24)
BUN: 17 mg/dL (ref 8–27)
Bilirubin Total: 0.3 mg/dL (ref 0.0–1.2)
CO2: 20 mmol/L (ref 20–29)
Calcium: 10 mg/dL (ref 8.6–10.2)
Chloride: 105 mmol/L (ref 96–106)
Creatinine, Ser: 0.94 mg/dL (ref 0.76–1.27)
Globulin, Total: 2.8 g/dL (ref 1.5–4.5)
Glucose: 110 mg/dL — ABNORMAL HIGH (ref 70–99)
Potassium: 4.6 mmol/L (ref 3.5–5.2)
Sodium: 141 mmol/L (ref 134–144)
Total Protein: 7 g/dL (ref 6.0–8.5)
eGFR: 85 mL/min/{1.73_m2} (ref >59)

## 2022-02-15 LAB — TSH: TSH: 2.12 u[IU]/mL (ref 0.450–4.500)

## 2022-02-15 LAB — LIPID PANEL
Chol/HDL Ratio: 2.6 ratio (ref 0.0–5.0)
Cholesterol, Total: 121 mg/dL (ref 100–199)
HDL: 47 mg/dL (ref >39)
LDL Chol Calc (NIH): 59 mg/dL (ref 0–99)
Triglycerides: 74 mg/dL (ref 0–149)
VLDL Cholesterol Cal: 15 mg/dL (ref 5–40)

## 2022-02-15 LAB — HEMOGLOBIN A1C
Est. average glucose Bld gHb Est-mCnc: 128 mg/dL
Hgb A1c MFr Bld: 6.1 % — ABNORMAL HIGH (ref 4.8–5.6)

## 2022-02-25 ENCOUNTER — Other Ambulatory Visit: Payer: Medicare Other

## 2022-02-25 ENCOUNTER — Ambulatory Visit: Payer: Medicare Other | Admitting: Thoracic Surgery (Cardiothoracic Vascular Surgery)

## 2022-03-03 ENCOUNTER — Telehealth: Payer: Self-pay | Admitting: Internal Medicine

## 2022-03-03 NOTE — Telephone Encounter (Signed)
Copied from [place] [id number]. Topic: Medicare AWV >> Mar 03, 2022 11:15 AM Devoria Glassing wrote: Reason for CRM: Left message tor patient to schedule Medicare Annual Wellness Visit (AWV) with [facility] Health Advisor.  Appointment can be an offiice/telephone or virtual visit;  Please call [phone] ask for [facility].

## 2022-03-05 ENCOUNTER — Ambulatory Visit
Admission: RE | Admit: 2022-03-05 | Discharge: 2022-03-05 | Disposition: A | Discharge status: Home / Self Care | Payer: Medicare Other | Source: Ambulatory Visit | Attending: Thoracic Surgery (Cardiothoracic Vascular Surgery) | Admitting: Thoracic Surgery (Cardiothoracic Vascular Surgery)

## 2022-03-05 DIAGNOSIS — I700 Atherosclerosis of aorta: Secondary | ICD-10-CM

## 2022-03-05 DIAGNOSIS — I7123 Aneurysm of the descending thoracic aorta, without rupture: Secondary | ICD-10-CM

## 2022-03-05 MED ORDER — IOPAMIDOL (ISOVUE-370) INJECTION 76%
100.0000 mL | Freq: Once | INTRAVENOUS | Status: AC | PRN
  Administered 2022-03-05: 100 mL via INTRAVENOUS

## 2022-03-14 ENCOUNTER — Telehealth: Payer: Self-pay | Admitting: Internal Medicine

## 2022-03-14 NOTE — Telephone Encounter (Signed)
Copied from [place] [phone]. Topic: Medicare AWV >> Mar 14, 2022 10:42 AM Devoria Glassing wrote: Reason for CRM: Left message tor patient to schedule Medicare Annual Wellness Visit (AWV) with [facility] Health Advisor.  Appointment can be an offiice/telephone or virtual visit;  Please call [phone] ask for [facility].

## 2022-03-25 ENCOUNTER — Ambulatory Visit (INDEPENDENT_AMBULATORY_CARE_PROVIDER_SITE_OTHER): Payer: Medicare Other | Admitting: Thoracic Surgery (Cardiothoracic Vascular Surgery)

## 2022-03-25 VITALS — BP 117/67 | HR 72 | Resp 20 | Ht 72.0 in | Wt 240.0 lb

## 2022-03-25 DIAGNOSIS — I7121 Aneurysm of the ascending aorta, without rupture: Secondary | ICD-10-CM

## 2022-03-25 NOTE — Progress Notes (Signed)
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  First noted to have aneurysms in 2019 after a syncopal spell at [facility] airport.  I last saw him in June 2023.  He was feeling well at that time.  Aneurysms were stable.  In the interim since his last visit he has continued to do well.  He is not having any chest pain, pressure, tightness, or shortness of breath.  Plays golf on a regular basis.  Past Medical History:  Diagnosis Date   Abdominal aortic aneurysm (AAA) ([facility]) 02/02/2018   Aneurysm, aortic (HCC)    Basal cell carcinoma 2016   Benign neoplasm of ascending colon    Benign neoplasm of descending colon    Benign neoplasm of sigmoid colon    CHF (congestive heart failure) (HCC)    Diverticulitis    DVT (deep venous thrombosis) (HCC)    Glaucoma    Hx of abdominal abscess 07/29/2013   2015 - diverticular abscess   Hypercholesteremia    Polyp of sigmoid colon    Rectal polyp    Thoracic aortic atherosclerosis ([place]) 02/02/2018   Wears dentures    partial upper    Current Outpatient Medications  Medication Sig Dispense Refill   aspirin EC 81 MG tablet Take 1 tablet (81 mg total) by mouth daily. Swallow whole.     atorvastatin (LIPITOR) 40 MG tablet TAKE 1 TABLET DAILY (STOPPING SIMVASTATIN) 90 tablet 3   latanoprost (XALATAN) 0.005 % ophthalmic solution 1 drop at bedtime.     losartan (COZAAR) 25 MG tablet TAKE ONE-HALF (1/2) TABLET DAILY 45 tablet 3   No current facility-administered medications for this visit.    Physical Exam BP 117/67   Pulse 72   Resp  20   Ht 6' (1.829 m)   Wt 240 lb (108.9 kg)   SpO2 94% Comment: RA  BMI 32.71 kg/m  77 year old man in no acute distress Alert and oriented x 3 with no focal deficits Lungs clear with equal breath sounds bilaterally Cardiac regular rate and rhythm, no murmur Palpable distal pulses No peripheral edema  Diagnostic Tests: CT ANGIOGRAPHY CHEST, ABDOMEN AND PELVIS   TECHNIQUE: Non-contrast CT of the chest was initially obtained.   Multidetector CT imaging through the chest, abdomen and pelvis was performed using the standard protocol during bolus administration of intravenous contrast. Multiplanar reconstructed images and MIPs were obtained and reviewed to evaluate the vascular anatomy.   RADIATION DOSE REDUCTION: This exam was performed according to the departmental dose-optimization program which includes automated exposure control, adjustment of the mA and/or kV according to patient size and/or use of iterative reconstruction technique.   CONTRAST:  166m ISOVUE-370 IOPAMIDOL (ISOVUE-370) INJECTION 76%   COMPARISON:  07/03/2021 and 11/30/2019.   FINDINGS: CTA CHEST FINDINGS   Cardiovascular: Distal aortic arch measures up to 5.0 cm (sagittal series 8, image 90), stable. Associated ulcerative plaque. Two small saccular aneurysms off the distal descending rapid aorta stable. Atherosclerotic calcification of the aorta, aortic valve and coronary arteries. Heart size normal. No pericardial effusion. Enlarged right  and left pulmonary arteries.   Mediastinum/Nodes: No pathologically enlarged mediastinal, hilar or axillary lymph nodes. Esophagus is grossly unremarkable.   Lungs/Pleura: Mild centrilobular emphysema. Scattered pulmonary parenchymal scarring. 3 mm nodule in the superior segment right lower lobe (10/81), unchanged from 11/30/2019 and benign. Per Fleischner Society guidelines, no follow-up is necessary. No pleural fluid. Airway is unremarkable.   Musculoskeletal:  Degenerative changes in the spine. No worrisome lytic or sclerotic lesions.   Review of the MIP images confirms the above findings.   CTA ABDOMEN AND PELVIS FINDINGS   VASCULAR   Aorta: Infrarenal aorta measures up to 4.9 cm proximally and 4.5 cm distally, stable.   Celiac: Widely patent.   SMA: Widely patent.   Renals: 2 renal arteries on the right.  Widely patent bilaterally.   IMA: Patent. Arises just above the distal abdominal aortic aneurysm.   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 Left common iliac artery, 2.5 cm, stable. Widely patent bilaterally.   Veins: Poorly evaluated due to lack of contrast opacification.   Review of the MIP images confirms the above findings.   NON-VASCULAR   Hepatobiliary: Liver and gallbladder are unremarkable. No biliary ductal dilatation.   Pancreas: Calcifications are seen throughout the pancreas.   Spleen: Negative.   Adrenals/Urinary Tract: Adrenal glands and kidneys are unremarkable. Ureters are decompressed. Bladder is low in volume.   Stomach/Bowel: Stomach, small bowel, appendix and colon are unremarkable. Diverticular thickening of the sigmoid colon.   Lymphatic: No pathologically enlarged lymph nodes.   Reproductive: Prostate is visualized.   Other: No free fluid.  Mesenteries and peritoneum are unremarkable.   Musculoskeletal: Degenerative changes in the spine. Probable degenerative sclerosis in the sacrum. No worrisome lytic or sclerotic lesions.   Review of the MIP images confirms the above findings.   IMPRESSION: 1. 5.0 cm distal aortic arch aneurysm with associated ulcerative plaque, stable. Recommend semi-annual imaging followup by CTA or MRA. This recommendation follows 2010 ACCF/AHA/AATS/ACR/ASA/SCA/SCAI/SIR/STS/SVM Guidelines for the Diagnosis and Management of Patients With Thoracic Aortic Disease. Circulation. 2010; 121: K481-E56. Aortic aneurysm NOS  (ICD10-I71.9). 2. Two infrarenal aortic aneurysms, stable. Recommend follow-up every 6 months and vascular consultation. This recommendation follows ACR consensus guidelines: White Paper of the ACR Incidental Findings Committee II on Vascular Findings. J Am Coll Radiol 2013; 10:789-794. 3. Bilateral common iliac artery aneurysms, right greater than left. 4. Similar small saccular aneurysms of the distal descending thoracic aorta 5. Chronic pancreatitis. 6. Aortic atherosclerosis (ICD10-I70.0). Coronary artery calcification. 7. Enlarged pulmonary arteries, indicative of pulmonary arterial hypertension. 8.  Emphysema (ICD10-J43.9).     Electronically Signed   By: Lorin Picket M.D.   On: 03/05/2022 13:30 I personally reviewed the CT images.  5 cm distal arch proximal descending aneurysm with ulcerative atherosclerotic plaques.  Unchanged from prior scans.  Abdominal and iliac aneurysms unchanged.  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  First noted to have aneurysms in 2019 after a syncopal spell at [facility] airport.  Descending thoracic aneurysm-with ulcerated atherosclerotic plaques.  Stable at 5 cm.  Unchanged from previous scan.  Needs continued follow-up.  Aortic and iliac aneurysms-he saw Dr. Virl Cagey in December 2022 but has not had any follow-up with him since.  Will send a message to reestablish follow-up there.  Plan: Follow-up with Dr. Virl Cagey  I will see Mark Hooper back in 6 months with a CT angio of the chest abdomen and pelvis  I spent over 20 minutes in review of records, images, and in consultation with Mark Hooper today.  [name], MD [facility] [phone]

## 2022-04-16 ENCOUNTER — Telehealth: Payer: Self-pay | Admitting: Internal Medicine

## 2022-04-16 NOTE — Telephone Encounter (Signed)
Copied from [place] [id number]. Topic: Medicare AWV >> Apr 16, 2022 12:21 PM Devoria Glassing wrote: Reason for CRM: Called patient to schedule Medicare Annual Wellness Visit (AWV). Left message for patient to call back and schedule Medicare Annual Wellness Visit (AWV).  Last date of AWV:04/18/2020  Please schedule an appointment at any time with Kirke Shaggy, [place]  .  If any questions, please contact me.  Thank you ,  [name]; [place] Direct Dial: [phone]

## 2022-04-23 ENCOUNTER — Telehealth: Payer: Self-pay | Admitting: Internal Medicine

## 2022-04-23 NOTE — Telephone Encounter (Signed)
Contacted Mark Hooper to schedule their annual wellness visit. Appointment made for 04/30/2022.  [name]; [facility] Direct Dial: [phone]

## 2022-04-30 ENCOUNTER — Ambulatory Visit (INDEPENDENT_AMBULATORY_CARE_PROVIDER_SITE_OTHER): Payer: Medicare Other

## 2022-04-30 VITALS — BP 122/78 | Ht 72.0 in | Wt 236.4 lb

## 2022-04-30 DIAGNOSIS — Z0000 Encounter for general adult medical examination without abnormal findings: Secondary | ICD-10-CM

## 2022-04-30 NOTE — Patient Instructions (Signed)
Mr. Mark Hooper , Thank you for taking time to come for your Medicare Wellness Visit. I appreciate your ongoing commitment to your health goals. Please review the following plan we discussed and let me know if I can assist you in the future.   These are the goals we discussed:  Goals       DIET - EAT MORE FRUITS AND VEGETABLES      Quit Smoking      Pt wants to try chantix again to help quit smoking. Currently using nicotine patches. Smoking cessation information given.   Pt has cut back to 1/2 pack per day as of 04/18/19.       Quit smoking / using tobacco      Did not do well with Chantix due to nausea; will try Nicotine patches      RNCM: "I stil have ringing in my ears" (pt-stated)      Care Coordination Interventions: Evaluation of current treatment plan related to ringing in his ears and patient's adherence to plan as established by provider Advised patient to call the office for worsening ringing in his ears or new issues related to his chronic health conditions Provided education to patient re: the care coordination program and the availability of RNCM to help with questions, concerns, or educational needs  Provided patient with epleys maneuver  educational materials related to possible help with ringing in the ears Reviewed scheduled/upcoming provider appointments including patient states he sees the ENT coming up, could not remember exact date. Will see pcp again on 02-14-2022 at 0940 am Discussed plans with patient for ongoing care management follow up and provided patient with direct contact information for care management team Advised patient to discuss changes in his chronic conditions with provider Screening for signs and symptoms of depression related to chronic disease state  Assessed social determinant of health barriers Review of the care coordination program and the availability of the [facility] for assistance with questions, concerns, or new educational needs.             This is a list of the screening recommended for you and due dates:  Health Maintenance  Topic Date Due   DTaP/Tdap/Td vaccine (1 - Tdap) Never done   COVID-19 Vaccine (4 - 2023-24 season) 10/18/2021   Screening for Lung Cancer  03/06/2023   Medicare Annual Wellness Visit  04/30/2023   Colon Cancer Screening  11/24/2024   Pneumonia Vaccine  Completed   Flu Shot  Completed   Zoster (Shingles) Vaccine  Completed   Hepatitis C Screening: USPSTF Recommendation to screen - Ages 78-79 yo.  Addressed   HPV Vaccine  Aged Out    Advanced directives: no  Conditions/risks identified: none  Next appointment: Follow up in one year for your annual wellness visit. 05/06/23 @ 9:45 am in person  Preventive Care 65 Years and Older, Male  Preventive care refers to lifestyle choices and visits with your health care provider that can promote health and wellness. What does preventive care include? A yearly physical exam. This is also called an annual well check. Dental exams once or twice a year. Routine eye exams. Ask your health care provider how often you should have your eyes checked. Personal lifestyle choices, including: Daily care of your teeth and gums. Regular physical activity. Eating a healthy diet. Avoiding tobacco and drug use. Limiting alcohol use. Practicing safe sex. Taking low doses of aspirin every day. Taking vitamin and mineral supplements as recommended by your health care provider.  What happens during an annual well check? The services and screenings done by your health care provider during your annual well check will depend on your age, overall health, lifestyle risk factors, and family history of disease. Counseling  Your health care provider may ask you questions about your: Alcohol use. Tobacco use. Drug use. Emotional well-being. Home and relationship well-being. Sexual activity. Eating habits. History of falls. Memory and ability to understand (cognition). Work  and work Statistician. Screening  You may have the following tests or measurements: Height, weight, and BMI. Blood pressure. Lipid and cholesterol levels. These may be checked every 5 years, or more frequently if you are over 63 years old. Skin check. Lung cancer screening. You may have this screening every year starting at age 42 if you have a 30-pack-year history of smoking and currently smoke or have quit within the past 15 years. Fecal occult blood test (FOBT) of the stool. You may have this test every year starting at age 72. Flexible sigmoidoscopy or colonoscopy. You may have a sigmoidoscopy every 5 years or a colonoscopy every 10 years starting at age 57. Prostate cancer screening. Recommendations will vary depending on your family history and other risks. Hepatitis C blood test. Hepatitis B blood test. Sexually transmitted disease (STD) testing. Diabetes screening. This is done by checking your blood sugar (glucose) after you have not eaten for a while (fasting). You may have this done every 1-3 years. Abdominal aortic aneurysm (AAA) screening. You may need this if you are a current or former smoker. Osteoporosis. You may be screened starting at age 90 if you are at high risk. Talk with your health care provider about your test results, treatment options, and if necessary, the need for more tests. Vaccines  Your health care provider may recommend certain vaccines, such as: Influenza vaccine. This is recommended every year. Tetanus, diphtheria, and acellular pertussis (Tdap, Td) vaccine. You may need a Td booster every 10 years. Zoster vaccine. You may need this after age 47. Pneumococcal 13-valent conjugate (PCV13) vaccine. One dose is recommended after age 56. Pneumococcal polysaccharide (PPSV23) vaccine. One dose is recommended after age 56. Talk to your health care provider about which screenings and vaccines you need and how often you need them. This information is not intended  to replace advice given to you by your health care provider. Make sure you discuss any questions you have with your health care provider. Document Released: 03/02/2015 Document Revised: 10/24/2015 Document Reviewed: 12/05/2014 Elsevier Interactive Patient Education  2017 [place] Prevention in the Home Falls can cause injuries. They can happen to people of all ages. There are many things you can do to make your home safe and to help prevent falls. What can I do on the outside of my home? Regularly fix the edges of walkways and driveways and fix any cracks. Remove anything that might make you trip as you walk through a door, such as a raised step or threshold. Trim any bushes or trees on the path to your home. Use bright outdoor lighting. Clear any walking paths of anything that might make someone trip, such as rocks or tools. Regularly check to see if handrails are loose or broken. Make sure that both sides of any steps have handrails. Any raised decks and porches should have guardrails on the edges. Have any leaves, snow, or ice cleared regularly. Use sand or salt on walking paths during winter. Clean up any spills in your garage right away. This includes  oil or grease spills. What can I do in the bathroom? Use night lights. Install grab bars by the toilet and in the tub and shower. Do not use towel bars as grab bars. Use non-skid mats or decals in the tub or shower. If you need to sit down in the shower, use a plastic, non-slip stool. Keep the floor dry. Clean up any water that spills on the floor as soon as it happens. Remove soap buildup in the tub or shower regularly. Attach bath mats securely with double-sided non-slip rug tape. Do not have throw rugs and other things on the floor that can make you trip. What can I do in the bedroom? Use night lights. Make sure that you have a light by your bed that is easy to reach. Do not use any sheets or blankets that are too big  for your bed. They should not hang down onto the floor. Have a firm chair that has side arms. You can use this for support while you get dressed. Do not have throw rugs and other things on the floor that can make you trip. What can I do in the kitchen? Clean up any spills right away. Avoid walking on wet floors. Keep items that you use a lot in easy-to-reach places. If you need to reach something above you, use a strong step stool that has a grab bar. Keep electrical cords out of the way. Do not use floor polish or wax that makes floors slippery. If you must use wax, use non-skid floor wax. Do not have throw rugs and other things on the floor that can make you trip. What can I do with my stairs? Do not leave any items on the stairs. Make sure that there are handrails on both sides of the stairs and use them. Fix handrails that are broken or loose. Make sure that handrails are as long as the stairways. Check any carpeting to make sure that it is firmly attached to the stairs. Fix any carpet that is loose or worn. Avoid having throw rugs at the top or bottom of the stairs. If you do have throw rugs, attach them to the floor with carpet tape. Make sure that you have a light switch at the top of the stairs and the bottom of the stairs. If you do not have them, ask someone to add them for you. What else can I do to help prevent falls? Wear shoes that: Do not have high heels. Have rubber bottoms. Are comfortable and fit you well. Are closed at the toe. Do not wear sandals. If you use a stepladder: Make sure that it is fully opened. Do not climb a closed stepladder. Make sure that both sides of the stepladder are locked into place. Ask someone to hold it for you, if possible. Clearly mark and make sure that you can see: Any grab bars or handrails. First and last steps. Where the edge of each step is. Use tools that help you move around (mobility aids) if they are needed. These  include: Canes. Walkers. Scooters. Crutches. Turn on the lights when you go into a dark area. Replace any light bulbs as soon as they burn out. Set up your furniture so you have a clear path. Avoid moving your furniture around. If any of your floors are uneven, fix them. If there are any pets around you, be aware of where they are. Review your medicines with your doctor. Some medicines can make you feel dizzy. This  can increase your chance of falling. Ask your doctor what other things that you can do to help prevent falls. This information is not intended to replace advice given to you by your health care provider. Make sure you discuss any questions you have with your health care provider. Document Released: 11/30/2008 Document Revised: 07/12/2015 Document Reviewed: 03/10/2014 Elsevier Interactive Patient Education  2017 [employer].

## 2022-04-30 NOTE — Progress Notes (Signed)
Subjective:   Mark Hooper is a 77 y.o. male who presents for Medicare Annual/Subsequent preventive examination.  Review of Systems     Cardiac Risk Factors include: advanced age (>75mn, >>38women);dyslipidemia;male gender;smoking/ tobacco exposure;obesity (BMI >30kg/m2)     Objective:    Today's Vitals   04/30/22 1131  BP: 122/78  Weight: 236 lb 6.4 oz (107.2 kg)  Height: 6' (1.829 m)   Body mass index is 32.06 kg/m.     04/30/2022   11:37 AM 08/22/2020   11:10 AM 04/18/2020    2:13 PM 11/25/2019    7:13 AM 04/18/2019    2:30 PM 04/12/2018    3:54 PM 02/06/2016   10:37 AM  Advanced Directives  Does Patient Have a Medical Advance Directive? No Yes No No No Yes Yes  Type of Advance Directive  Living will;Healthcare Power of [place] will Living will  Copy of [place] Chart?  No - copy requested    No - copy requested   Would patient like information on creating a medical advance directive? No - Patient declined  Yes (MAU/Ambulatory/Procedural Areas - Information given) No - Patient declined Yes (MAU/Ambulatory/Procedural Areas - Information given)      Current Medications (verified) Outpatient Encounter Medications as of 04/30/2022  Medication Sig   aspirin EC 81 MG tablet Take 1 tablet (81 mg total) by mouth daily. Swallow whole.   atorvastatin (LIPITOR) 40 MG tablet TAKE 1 TABLET DAILY (STOPPING SIMVASTATIN)   latanoprost (XALATAN) 0.005 % ophthalmic solution 1 drop at bedtime.   losartan (COZAAR) 25 MG tablet TAKE ONE-HALF (1/2) TABLET DAILY   No facility-administered encounter medications on file as of 04/30/2022.    Allergies (verified) Patient has no known allergies.   History: Past Medical History:  Diagnosis Date   Ab[place] (AAA) [place] 02/02[place]   Aneurysm, aortic (HCC)    Basal cell carcinoma 2016   Benign neoplasm of ascending colon    Benign neoplasm of descending colon     Benign neoplasm of sigmoid colon    CHF (congestive heart failure) (HCC)    Diverticulitis    DVT (deep venous thrombosis) (HCC)    Glaucoma    Hx of abdominal abscess 07/29/2013   2015 - diverticular abscess   Hypercholesteremia    Polyp of sigmoid colon    Rectal polyp    Thoracic aortic atherosclerosis ([facility] [place] 02/02/2018   Wears dentures    partial upper   Past Surgical History:  Procedure Laterality Date   basal cell cancer excision     CATARACT EXTRACTION     COLONOSCOPY     COLONOSCOPY WITH PROPOFOL N/A 10/27/2014   Procedure: COLONOSCOPY WITH PROPOFOL;  Surgeon: DLucilla Lame MD;  [place] [facility]  Service: Endoscopy;  Laterality: N/A;   COLONOSCOPY WITH PROPOFOL N/A 11/25/2019   Procedure: COLONOSCOPY WITH PROPOFOL;  Surgeon: WLucilla Lame MD;  [place] [facility]  Service: Endoscopy;  Laterality: N/A;  priority 4   POLYPECTOMY  10/27/2014   Procedure: POLYPECTOMY;  Surgeon: DLucilla Lame MD;  [place] [facility]  Service: Endoscopy;;   POLYPECTOMY  11/25/2019   Procedure: POLYPECTOMY;  Surgeon: WLucilla Lame MD;  [facility]  Service: Endoscopy;;   Family History  Problem Relation Age of Onset   Heart disease Father        CABG x 3   Hyperlipidemia Father    CAD Father  Prostate cancer Neg Hx    Bladder Cancer Neg Hx    Kidney cancer Neg Hx    Social History   Socioeconomic History   Marital status: Married    Spouse name: Not on file   Number of children: 2   Years of education: Not on file   Highest education level: Associate degree: academic program  Occupational History   Occupation: Armed forces operational officer  Tobacco Use   Smoking status: Former    Packs/day: 1.00    Years: 55.00    Total pack years: 55.00    Types: Cigarettes    Quit date: 07/01/2020    Years since quitting: 1.8   Smokeless tobacco: Never   Tobacco comments:    smoking cessation information given  Vaping Use   Vaping Use: Never used   Substance and Sexual Activity   Alcohol use: Not Currently   Drug use: Never   Sexual activity: Not on file  Other Topics Concern   Not on file  Social History Narrative   Not on file   Social Determinants of Health   Financial Resource Strain: [place]  (04/30/2022)   Overall Financial Resource Strain (CARDIA)    Difficulty of Paying Living Expenses: Not hard at all  Food Insecurity: No Food Insecurity (04/30/2022)   Hunger Vital Sign    Worried About Running Out of Food in the Last Year: Never true    [place] in the Last Year: Never true  Transportation Needs: No Transportation Needs (04/30/2022)   PRAPARE - Hydrologist (Medical): No    Lack of Transportation (Non-Medical): No  Physical Activity: Sufficiently Active (04/30/2022)   Exercise Vital Sign    Days of Exercise per Week: 5 days    Minutes of Exercise per Session: 30 min  Recent Concern: Physical Activity - Insufficiently Active (04/30/2022)   Exercise Vital Sign    Days of Exercise per Week: 2 days    Minutes of Exercise per Session: 20 min  Stress: No Stress Concern Present (04/30/2022)   [place]    Feeling of Stress : Not at all  Social Connections: [place] (04/30/2022)   Social Connection and Isolation Panel [NHANES]    Frequency of Communication with Friends and Family: Once a week    Frequency of Social Gatherings with Friends and Family: Twice a week    Attends Religious Services: More than 4 times per year    Active Member of [employer] or Organizations: Yes    Attends Music therapist: More than 4 times per year    Marital Status: Married    Tobacco Counseling Counseling given: Not Answered Tobacco comments: smoking cessation information given   Clinical Intake:  Pre-visit preparation completed: Yes  Pain : No/denies pain     Nutritional Risks: None Diabetes: No  How  often do you need to have someone help you when you read instructions, pamphlets, or other written materials from your doctor or pharmacy?: 1 - Never  Diabetic?no  Interpreter Needed?: No  Information entered by :: Kirke Shaggy LPN   Activities of Daily Living    04/30/2022   11:38 AM  In your present state of health, do you have any difficulty performing the following activities:  Hearing? 0  Vision? 0  Difficulty concentrating or making decisions? 0  Walking or climbing stairs? 0  Dressing or bathing? 0  Doing errands, shopping? 0  Preparing Food and eating ? N  Using the Toilet? N  In the past six months, have you accidently leaked urine? N  Do you have problems with loss of bowel control? N  Managing your Medications? N  Managing your Finances? N  Housekeeping or managing your Housekeeping? N    Patient Care Team: Glean Hess, MD as PCP - General (Internal Medicine) End, Harrell Gave, MD as PCP - Cardiology (Cardiology) Ree Edman, MD (Dermatology) [name], MD as Consulting Physician (Cardiothoracic Surgery)  Indicate any recent Medical Services you may have received from other than [facility] providers in the past year (date may be approximate).     Assessment:   This is a routine wellness examination for Nathanie.  Hearing/Vision screen Hearing Screening - Comments:: No aids Vision Screening - Comments:: Wears glasses- [facility] (maybe?)  Dietary issues and exercise activities discussed: Current Exercise Habits: Home exercise routine, Type of exercise: walking (golf), Time (Minutes): 30, Frequency (Times/Week): 5, Weekly Exercise (Minutes/Week): 150, Intensity: Mild   Goals Addressed             This Visit's Progress    DIET - EAT MORE FRUITS AND VEGETABLES         Depression Screen    04/30/2022   11:34 AM 02/14/2022    9:39 AM 10/09/2021    9:04 AM 04/05/2021    9:27 AM 01/21/2021   10:02 AM 08/22/2020   11:09 AM 04/18/2020     2:10 PM  PHQ 2/9 Scores  PHQ - 2 Score 0 0 0 0 0 0 0  PHQ- 9 Score 0 2 0 0 0      Fall Risk    04/30/2022   11:38 AM 02/14/2022    9:39 AM 10/09/2021    9:05 AM 04/05/2021    9:28 AM 01/21/2021   10:02 AM  Fall Risk   Falls in the past year? 0 0 0 0 0  Number falls in past yr: 0 0 0 0 0  Injury with Fall? 0 0 0 0 0  Risk for fall due to : No Fall Risks No Fall Risks No Fall Risks No Fall Risks No Fall Risks  Follow up Falls prevention discussed;Falls evaluation completed Falls evaluation completed Falls evaluation completed Falls evaluation completed Falls evaluation completed    FALL RISK PREVENTION PERTAINING TO THE HOME:  Any stairs in or around the home? Yes  If so, are there any without handrails? No  Home free of loose throw rugs in walkways, pet beds, electrical cords, etc? Yes  Adequate lighting in your home to reduce risk of falls? Yes   ASSISTIVE DEVICES UTILIZED TO PREVENT FALLS:  Life alert? No  Use of a cane, walker or w/c? No  Grab bars in the bathroom? Yes  Shower chair or bench in shower? Yes  Elevated toilet seat or a handicapped toilet? No   TIMED UP AND GO:  Was the test performed? Yes .  Length of time to ambulate 10 feet: 4 sec.   Gait steady and fast without use of assistive device  Cognitive Function:        04/30/2022   11:44 AM 04/12/2018    4:00 PM 02/06/2016   10:41 AM  6CIT Screen  What Year? 0 points 0 points 0 points  What month? 0 points 0 points 0 points  What time? 0 points 0 points 0 points  Count back from 20 0 points 0 points 0 points  Months in  reverse 4 points 0 points 0 points  Repeat phrase 10 points 0 points 0 points  Total Score 14 points 0 points 0 points    Immunizations Immunization History  Administered Date(s) Administered   Fluad Quad(high Dose 65+) 02/14/2022   Influenza, High Dose Seasonal PF 10/26/2017   Influenza-Unspecified 12/03/2018, 11/19/2019, 11/17/2020   PFIZER(Purple Top)SARS-COV-2 Vaccination  03/29/2019, 04/19/2019, 11/28/2019   PNEUMOCOCCAL CONJUGATE-20 02/14/2022   Pneumococcal Conjugate-13 02/05/2015   Pneumococcal Polysaccharide-23 01/05/2012   Zoster Recombinat (Shingrix) 12/10/2018, 02/15/2019    TDAP status: Due, Education has been provided regarding the importance of this vaccine. Advised may receive this vaccine at local pharmacy or Health Dept. Aware to provide a copy of the vaccination record if obtained from local pharmacy or Health Dept. Verbalized acceptance and understanding.  Flu Vaccine status: Up to date  Pneumococcal vaccine status: Up to date  Covid-19 vaccine status: Completed vaccines  Qualifies for Shingles Vaccine? Yes   Zostavax completed No   Shingrix Completed?: Yes  Screening Tests Health Maintenance  Topic Date Due   DTaP/Tdap/Td (1 - Tdap) Never done   COVID-19 Vaccine (4 - 2023-24 season) 10/18/2021   Lung Cancer Screening  03/06/2023   Medicare Annual Wellness (AWV)  04/30/2023   COLONOSCOPY (Pts 45-50yr Insurance coverage will need to be confirmed)  11/24/2024   Pneumonia Vaccine 77 Years old  Completed   INFLUENZA VACCINE  Completed   Zoster Vaccines- Shingrix  Completed   Hepatitis C Screening  Addressed   HPV VACCINES  Aged Out    Health Maintenance  Health Maintenance Due  Topic Date Due   DTaP/Tdap/Td (1 - Tdap) Never done   COVID-19 Vaccine (4 - 2023-24 season) 10/18/2021    Colorectal cancer screening: Type of screening: Colonoscopy. Completed 11/25/19. Repeat every 5 years- aged out  Lung Cancer Screening: (Low Dose CT Chest recommended if Age 76-80years, 30 pack-year currently smoking OR have quit w/in 15years.) does qualify.   Lung Cancer Screening Referral: has CT scan every 6 months to monitor aneurysms   Additional Screening:  Hepatitis C Screening: does qualify; Completed 02/05/15  Vision Screening: Recommended annual ophthalmology exams for early detection of glaucoma and other disorders of the eye. Is  the patient up to date with their annual eye exam?  Yes  Who is the provider or what is the name of the office in which the patient attends annual eye exams? ABanksIf pt is not established with a provider, would they like to be referred to a provider to establish care? No .   Dental Screening: Recommended annual dental exams for proper oral hygiene  Community Resource Referral / Chronic Care Management: CRR required this visit?  No   CCM required this visit?  No      Plan:     I have personally reviewed and noted the following in the patient's chart:   Medical and social history Use of alcohol, tobacco or illicit drugs  Current medications and supplements including opioid prescriptions. Patient is not currently taking opioid prescriptions. Functional ability and status Nutritional status Physical activity Advanced directives List of other physicians Hospitalizations, surgeries, and ER visits in previous 12 months Vitals Screenings to include cognitive, depression, and falls Referrals and appointments  In addition, I have reviewed and discussed with patient certain preventive protocols, quality metrics, and best practice recommendations. A written personalized care plan for preventive services as well as general preventive health recommendations were provided to patient.     Doneisha Ivey S  Drema Dallas, LPN   [id number]   Nurse Notes: none

## 2022-07-11 ENCOUNTER — Other Ambulatory Visit: Payer: Self-pay

## 2022-07-11 MED ORDER — LOSARTAN POTASSIUM 25 MG PO TABS: 12.5000 mg | ORAL_TABLET | Freq: Every day | ORAL | 3 refills | Status: DC

## 2022-07-11 NOTE — Telephone Encounter (Signed)
Requested Prescriptions   Signed Prescriptions Disp Refills   losartan (COZAAR) 25 MG tablet 45 tablet 3    Sig: Take 0.5 tablets (12.5 mg total) by mouth daily. Please contact office to schedule follow up prior to further refills    Authorizing Provider: END, CHRISTOPHER    Ordering User: Guerry Minors

## 2022-07-15 ENCOUNTER — Telehealth: Payer: Self-pay | Admitting: Internal Medicine

## 2022-07-15 ENCOUNTER — Other Ambulatory Visit: Payer: Self-pay

## 2022-07-15 NOTE — Telephone Encounter (Signed)
-----   Message from Ihor Dow, CMA sent at 07/15/2022  2:28 PM EDT ----- Please contact this patient for a follow up appointment for Dr. Okey Dupre. There is a recall in the patients chart for June 2024. The patient is requesting a refill on Atorvastatin.  Thanks, Jasmine December

## 2022-07-15 NOTE — Telephone Encounter (Signed)
Left voicemail to schedule follow up appt

## 2022-08-26 ENCOUNTER — Other Ambulatory Visit: Payer: Self-pay | Admitting: Thoracic Surgery (Cardiothoracic Vascular Surgery)

## 2022-08-26 DIAGNOSIS — I7121 Aneurysm of the ascending aorta, without rupture: Secondary | ICD-10-CM

## 2022-09-23 ENCOUNTER — Encounter: Payer: Self-pay | Admitting: Thoracic Surgery (Cardiothoracic Vascular Surgery)

## 2022-09-23 ENCOUNTER — Ambulatory Visit (INDEPENDENT_AMBULATORY_CARE_PROVIDER_SITE_OTHER): Payer: Medicare Other | Admitting: Thoracic Surgery (Cardiothoracic Vascular Surgery)

## 2022-09-23 ENCOUNTER — Ambulatory Visit
Admission: RE | Admit: 2022-09-23 | Discharge: 2022-09-23 | Disposition: A | Discharge status: Home / Self Care | Payer: Medicare Other | Source: Ambulatory Visit | Attending: Thoracic Surgery (Cardiothoracic Vascular Surgery) | Admitting: Thoracic Surgery (Cardiothoracic Vascular Surgery)

## 2022-09-23 VITALS — BP 110/72 | HR 65 | Resp 20 | Ht 72.0 in | Wt 229.0 lb

## 2022-09-23 DIAGNOSIS — I7121 Aneurysm of the ascending aorta, without rupture: Secondary | ICD-10-CM

## 2022-09-23 MED ORDER — IOPAMIDOL (ISOVUE-370) INJECTION 76%
75.0000 mL | Freq: Once | INTRAVENOUS | Status: AC | PRN
  Administered 2022-09-23: 75 mL via INTRAVENOUS

## 2022-09-23 NOTE — Progress Notes (Signed)
[address]       Mark Hooper [id number]             [phone]    HPI: Mark Hooper returns for follow-up of his aortic aneurysms.  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  First noted to have aneurysms in 2019 after a syncopal spell at [facility] airport.   I saw him in January.  His aneurysms were stable and he was feeling well.  In the interim since his last visit he has continued to feel well it.  He plays golf on a regular basis.  He is not having any chest pain, pressure, or tightness.  He is concerned about some memory loss.  He also noted a skin lesion on his chest.  Past Medical History:  Diagnosis Date   Abdominal aortic aneurysm (AAA) (HCC) 02/02/2018   Aneurysm, aortic (HCC)    Basal cell carcinoma 2016   Benign neoplasm of ascending colon    Benign neoplasm of descending colon    Benign neoplasm of sigmoid colon    CHF (congestive heart failure) (HCC)    Diverticulitis    DVT (deep venous thrombosis) (HCC)    Glaucoma    Hx of abdominal abscess 07/29/2013   2015 - diverticular abscess   Hypercholesteremia    Polyp of sigmoid colon    Rectal polyp    Thoracic aortic atherosclerosis (HCC) 02/02/2018   Wears dentures    partial upper    Current Outpatient Medications  Medication Sig Dispense Refill   aspirin EC 81 MG tablet Take 1 tablet (81 mg total) by mouth daily. Swallow whole.     atorvastatin (LIPITOR) 40 MG tablet TAKE 1 TABLET DAILY (STOPPING SIMVASTATIN) 90 tablet 3   latanoprost (XALATAN) 0.005 % ophthalmic solution 1 drop at bedtime.     losartan (COZAAR) 25 MG tablet Take 0.5 tablets (12.5 mg total) by mouth daily. Please contact office to schedule follow up prior to further refills 45 tablet 3   No current  facility-administered medications for this visit.    Physical Exam BP 110/72   Pulse 65   Resp 20   Ht 6' (1.829 m)   Wt 229 lb (103.9 kg)   SpO2 96% Comment: RA  BMI 31.60 kg/m  77 year old man in no acute distress Alert and oriented x 3 with no focal deficits Lungs clear bilaterally No carotid bruits Cardiac regular rate and rhythm with no murmur 1.5 cm cutaneous lesion left anterior chest wall, nontender  Diagnostic Tests: I personally reviewed the CT chest, abdomen and pelvis images.  Complex aortic atherosclerotic aneurysmal disease involving distal ascending, proximal descending, mid descending, abdominal, and right iliac.  Unchanged from his scan in January.  Await radiologist interpretation.  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    Thoracic and abdominal aortic atherosclerosis-on Lipitor, stable by CT.  Descending thoracic aneurysm with ulcerated atherosclerotic plaques-peers unchanged from his previous scan.  Await final radiology interpretation.  Will continue to follow-up with CT angio every 6 months.  Infrarenal aortic and iliac aneurysms-saw Dr. Karin Lieu in December 2022, but has not seen him since.  Will have  my office contact [facility] to arrange follow-up.  We might be able to rate off follow-up so that he sees one of the other breast every 6 months  Right anterior chest wall skin lesion-likely sebaceous cyst.  Recommended he check with his family doctor on that.  Memory loss-he is at least aware that he is having difficulty with his memory but also recommended he check with Dr. Judithann Graves see if any additional testing is necessary.  Plan: Follow-up with Dr. Judithann Graves regarding skin lesion and memory issues Follow-up with Dr. Karin Lieu  regarding abdominal aneurysms I will plan to see him back in 6 months with a CT of the chest, abdomen, and pelvis.  I spent over 20 minutes in review of records, images, and in consultation with [name] today. Loreli Slot, MD [facility] [phone]

## 2022-09-25 NOTE — Progress Notes (Deleted)
Office Note     CC: Carotid artery stenosis Requesting Provider:  [name], MD  HPI: Mark Hooper is a 77 y.o. ([birth year]) male presenting at the request of .[name], MD.  Corden was recently seen by his PCP with right neck pain and right carotid bruit, prompting carotid duplex ultrasonography and appointment with vascular surgery.  Devari is well-known to our service line, followed by Dr. 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 Dorris Fetch and Dr. Arbie Cookey.  He was last seen in November by Dr. Dorris Fetch with 61-month CT that demonstrated no appreciable changes to his multiple aneurysms.  On exam today, Yer was doing well.  He denied new onset chest, abdominal, back pain.  Last month, he appreciated some chest pain however this resolved. He is currently scheduled for an echocardiogram on 02/07/2021.  He denied symptoms of claudication, rest pain, tissue loss.    The pt is  on a statin for cholesterol management.  The pt is  on a daily aspirin.   Other AC:  - The pt is  on medication for hypertension.   The pt is not diabetic.  Tobacco hx:  former  Past Medical History:  Diagnosis Date   Abdominal aortic aneurysm (AAA) (HCC) 02/02/2018   Aneurysm, aortic (HCC)    Basal cell carcinoma 2016   Benign neoplasm of ascending colon    Benign neoplasm of descending colon    Benign neoplasm of sigmoid colon    CHF (congestive heart failure) (HCC)    Diverticulitis    DVT (deep venous thrombosis) (HCC)    Glaucoma    Hx of abdominal abscess 07/29/2013   2015 - diverticular abscess   Hypercholesteremia    Polyp of sigmoid colon    Rectal polyp    Thoracic aortic atherosclerosis (HCC) 02/02/2018   Wears dentures    partial upper    Past Surgical History:  Procedure Laterality Date   basal cell cancer excision     CATARACT EXTRACTION      COLONOSCOPY     COLONOSCOPY WITH PROPOFOL N/A 10/27/2014   Procedure: COLONOSCOPY WITH PROPOFOL;  Surgeon: Midge Minium, MD;  Location: [facility];  Service: Endoscopy;  Laterality: N/A;   COLONOSCOPY WITH PROPOFOL N/A 11/25/2019   Procedure: COLONOSCOPY WITH PROPOFOL;  Surgeon: Midge Minium, MD;  Location: [facility];  Service: Endoscopy;  Laterality: N/A;  priority 4   POLYPECTOMY  10/27/2014   Procedure: POLYPECTOMY;  Surgeon: Midge Minium, MD;  Location: [facility];  Service: Endoscopy;;   POLYPECTOMY  11/25/2019   Procedure: POLYPECTOMY;  Surgeon: Midge Minium, MD;  Location: [facility];  Service: Endoscopy;;    Social History   Socioeconomic History   Marital status: Married    Spouse name: Not on file   Number of children: 2   Years of education: Not on file   Highest education level: Associate degree: academic program  Occupational History   Occupation: business owner  Tobacco Use   Smoking status: Former    Current packs/day: 0.00    Average packs/day: 1 pack/day for 55.0 years (55.0 ttl pk-yrs)    Types: Cigarettes    Start date: 07/01/1965    Quit date: 07/01/2020    Years since quitting: 2.2   Smokeless tobacco: Never   Tobacco comments:    smoking cessation information given  Vaping Use  Vaping status: Never Used  Substance and Sexual Activity   Alcohol use: Not Currently   Drug use: Never   Sexual activity: Not on file  Other Topics Concern   Not on file  Social History Narrative   Not on file   Social Determinants of Health   Financial Resource Strain: Low Risk  (04/30/2022)   Overall Financial Resource Strain (CARDIA)    Difficulty of Paying Living Expenses: Not hard at all  Food Insecurity: No Food Insecurity (04/30/2022)   Hunger Vital Sign    Worried About Running Out of Food in the Last Year: Never true    Ran Out of Food in the Last Year: Never true  Transportation Needs: No Transportation Needs (04/30/2022)   PRAPARE -  Administrator, Civil Service (Medical): No    Lack of Transportation (Non-Medical): No  Physical Activity: Sufficiently Active (04/30/2022)   Exercise Vital Sign    Days of Exercise per Week: 5 days    Minutes of Exercise per Session: 30 min  Recent Concern: Physical Activity - Insufficiently Active (04/30/2022)   Exercise Vital Sign    Days of Exercise per Week: 2 days    Minutes of Exercise per Session: 20 min  Stress: No Stress Concern Present (04/30/2022)   [employer] of Occupational Health - Occupational Stress Questionnaire    Feeling of Stress : Not at all  Social Connections: Socially Integrated (04/30/2022)   Social Connection and Isolation Panel [NHANES]    Frequency of Communication with Friends and Family: Once a week    Frequency of Social Gatherings with Friends and Family: Twice a week    Attends Religious Services: More than 4 times per year    Active Member of [employer] or Organizations: Yes    Attends Engineer, structural: More than 4 times per year    Marital Status: Married  Catering manager Violence: Not At Risk (04/30/2022)   Humiliation, Afraid, Rape, and Kick questionnaire    Fear of Current or Ex-Partner: No    Emotionally Abused: No    Physically Abused: No    Sexually Abused: No    Family History  Problem Relation Age of Onset   Heart disease Father        CABG x 3   Hyperlipidemia Father    CAD Father    Prostate cancer Neg Hx    Bladder Cancer Neg Hx    Kidney cancer Neg Hx     Current Outpatient Medications  Medication Sig Dispense Refill   aspirin EC 81 MG tablet Take 1 tablet (81 mg total) by mouth daily. Swallow whole.     atorvastatin (LIPITOR) 40 MG tablet TAKE 1 TABLET DAILY (STOPPING SIMVASTATIN) 90 tablet 3   latanoprost (XALATAN) 0.005 % ophthalmic solution 1 drop at bedtime.     losartan (COZAAR) 25 MG tablet Take 0.5 tablets (12.5 mg total) by mouth daily. Please contact office to schedule follow up prior to  further refills 45 tablet 3   No current facility-administered medications for this visit.    No Known Allergies   REVIEW OF SYSTEMS:   [X]  denotes positive finding, [ ]  denotes negative finding Cardiac  Comments:  Chest pain or chest pressure:    Shortness of breath upon exertion:    Short of breath when lying flat:    Irregular heart rhythm:        Vascular    Pain in calf, thigh, or hip brought on by ambulation:  Pain in feet at night that wakes you up from your sleep:     Blood clot in your veins:    Leg swelling:         Pulmonary    Oxygen at home:    Productive cough:     Wheezing:         Neurologic    Sudden weakness in arms or legs:     Sudden numbness in arms or legs:     Sudden onset of difficulty speaking or slurred speech:    Temporary loss of vision in one eye:     Problems with dizziness:         Gastrointestinal    Blood in stool:     Vomited blood:         Genitourinary    Burning when urinating:     Blood in urine:        Psychiatric    Major depression:         Hematologic    Bleeding problems:    Problems with blood clotting too easily:        Skin    Rashes or ulcers:        Constitutional    Fever or chills:      PHYSICAL EXAMINATION:  There were no vitals filed for this visit.  General:  WDWN in NAD; vital signs documented above Gait: Not observed HENT: WNL, normocephalic Pulmonary: normal non-labored breathing , without wheezing Cardiac: regular HR, Abdomen: soft, NT, no masses Skin: without rashes Vascular Exam/Pulses:  Right Left  Radial 2+ (normal) 2+ (normal)  Ulnar 2+ (normal) 2+ (normal)  Femoral    Popliteal    DP 2+ (normal) 2+ (normal)  PT     Extremities: without ischemic changes, without Gangrene , without cellulitis; without open wounds;  Musculoskeletal: no muscle wasting or atrophy  Neurologic: A&O X 3;  No focal weakness or paresthesias are detected Psychiatric:  The pt has Normal  affect.   Non-Invasive Vascular Imaging:       ASSESSMENT/PLAN: BENGIE CORMICAN is a 77 y.o. male presenting with neck pain and right carotid bruit.  Duplex ultrasonography demonstrated mild carotid atherosclerotic disease, mainly appreciated at the proximal external carotid artery.  Indications for surgical repair are asymptomatic internal carotid artery stenosis greater than 80%, or symptomatic (stroke, TIA, amaurosis) stenosis greater than 50%.    Fortunately, Barnes is asymptomatic with relatively normal internal carotid arteries, devoid of stenosis.  He does not need follow-up imaging.  Pratham was scheduled to see Dr. Arbie Cookey next month, and therefore I called Dr. Arbie Cookey and we reviewed Rio's imaging to save him a visit.  There have been no appreciable changes to the zone 3 thoracic aneurysm, zone 4 thoracic aneurysm, pararenal aortic aneurysm, right common iliac artery aneurysm.  All of these aneurysms remain under the size criteria for elective repair per SVS guidelines except for the right common iliac artery aneurysm.  This aneurysm is unchanged in size.  [place] is aware of the 4 cm aneurysm which has been managed with short interval surveillance.  Being that this shows no growth Dr. Arbie Cookey, Homero Fellers, and I agreed to continue monitoring the aneurysm.  Britan is aware that this aneurysm has an increased risk of rupture as compared to the other areas.  Should this aneurysm grow at the 7-month mark, we will discuss elective repair.  Homero Fellers and I discussed the signs and symptoms of rupture. He was asked to call 911 immediately  should any of these occur I asked that Akio to continue his current medical management and that I would see him again in 6 months following repeat CT imaging.  Victorino Sparrow, MD [facility] [phone]

## 2022-09-26 ENCOUNTER — Ambulatory Visit: Payer: Medicare Other | Admitting: Vascular Surgery

## 2022-09-30 ENCOUNTER — Ambulatory Visit: Payer: Medicare Other | Admitting: Physician Assistant

## 2022-10-24 ENCOUNTER — Ambulatory Visit: Payer: Medicare Other | Attending: Physician Assistant | Admitting: Physician Assistant

## 2022-10-24 ENCOUNTER — Encounter: Payer: Self-pay | Admitting: Physician Assistant

## 2022-10-24 VITALS — BP 134/88 | HR 69 | Ht 72.0 in | Wt 231.2 lb

## 2022-10-24 DIAGNOSIS — I2510 Atherosclerotic heart disease of native coronary artery without angina pectoris: Secondary | ICD-10-CM | POA: Diagnosis present

## 2022-10-24 DIAGNOSIS — I719 Aortic aneurysm of unspecified site, without rupture: Secondary | ICD-10-CM | POA: Diagnosis not present

## 2022-10-24 DIAGNOSIS — I712 Thoracic aortic aneurysm, without rupture, unspecified: Secondary | ICD-10-CM | POA: Diagnosis not present

## 2022-10-24 DIAGNOSIS — I714 Abdominal aortic aneurysm, without rupture, unspecified: Secondary | ICD-10-CM | POA: Insufficient documentation

## 2022-10-24 DIAGNOSIS — R030 Elevated blood-pressure reading, without diagnosis of hypertension: Secondary | ICD-10-CM | POA: Insufficient documentation

## 2022-10-24 DIAGNOSIS — E785 Hyperlipidemia, unspecified: Secondary | ICD-10-CM | POA: Diagnosis present

## 2022-10-24 MED ORDER — ATORVASTATIN CALCIUM 40 MG PO TABS: 40.0000 mg | ORAL_TABLET | Freq: Every day | ORAL | 3 refills | Status: DC

## 2022-10-24 NOTE — Progress Notes (Signed)
Cardiology Office Note    Date:  10/24/2022   ID:  [name], DOB Mar 27, 1945, MRN [id number]  PCP:  [name], MD  Cardiologist:  Yvonne Kendall, MD  Electrophysiologist:  None   Chief Complaint: Follow-up  History of Present Illness:   Mark Hooper is a 77 y.o. 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 Dorris Singh, and [place], syncope in 2019, chronic pancreatitis, emphysema, and HLD who presents for follow-up of CAD.  He underwent coronary CTA in 01/2021 for evaluation of exertional fatigue, dyspnea, and precordial pain.  This demonstrated a descending thoracic aortic aneurysm with extensive calcified and noncalcified atherosclerotic plaque as well as a calcium score of 391 which was the 64th percentile.  There was mild nonobstructive CAD involving the proximal to mid LAD as well as the proximal and distal RCA.  Echo on 02/07/2021 demonstrated an EF of 60 to 65%, no regional wall motion abnormalities, grade 1 diastolic dysfunction, normal RV systolic function and ventricular cavity size, aortic valve sclerosis without evidence of stenosis, borderline dilatation of the aortic root and ascending aorta, and an estimated right atrial pressure of 3 mmHg.  He was last seen in the office in 07/2021 noting sporadic shortness of breath that would happen randomly at rest or with exertion that may have progressed over the preceding year or 2.  He continued to play 18 also golf 5 days/week without difficulty, though noted fatigue at the end of his round.  Most recent CTA of the aorta from 09/23/2022 showed grossly stable 5.2 cm distal thoracic aortic arch aneurysm as well as grossly stable 3.5 cm distal descending thoracic aortic aneurysm, stable  probable penetrating atherosclerotic ulcer in the distal descending thoracic aorta, grossly stable small saccular aneurysm arising from proximal portion of the left subclavian artery, grossly stable 4.5 cm proximal infrarenal AAA, grossly stable 4.1 cm aneurysm of the distal descending thoracic aorta, and grossly stable bilateral common iliac artery aneurysms.  He comes in doing well from a cardiac perspective and is without symptoms of angina or cardiac decompensation.  No progressive dyspnea, dizziness, presyncope, or syncope.  No lower extremity swelling or orthopnea.  He continues to play golf 5 days/week without cardiac limitation.  He does note some randomly occurring split-second lasting left flank discomfort that occurs, otherwise is without issues at this time.  Adherent and tolerating cardiac medications.   Labs independently reviewed: 01/2022 - TSH normal, TC 121, TG 74, HDL 47, LDL 59, A1c 6.1, BUN 17, serum creatinine 0.94, potassium 4.6, albumin 4.2, AST/ALT normal, Hgb 15.4, PLT 281  Past Medical History:  Diagnosis Date   Abdominal aortic aneurysm (AAA) (HCC) 02/02/2018   Aneurysm, aortic (HCC)    Basal cell carcinoma 2016   Benign neoplasm of ascending colon    Benign neoplasm of descending colon    Benign neoplasm of sigmoid colon    CHF (congestive heart failure) (HCC)    Diverticulitis    DVT (deep venous thrombosis) (HCC)    Glaucoma    Hx of abdominal abscess 07/29/2013   2015 - diverticular abscess   Hypercholesteremia    Polyp of sigmoid colon    Rectal polyp    Thoracic aortic atherosclerosis (HCC) 02/02/2018   Wears dentures    partial upper    Past  Surgical History:  Procedure Laterality Date   basal cell cancer excision     CATARACT EXTRACTION     COLONOSCOPY     COLONOSCOPY WITH PROPOFOL N/A 10/27/2014   Procedure: COLONOSCOPY WITH PROPOFOL;  Surgeon: Midge Minium, MD;  Location: [facility];  Service: Endoscopy;  Laterality: N/A;   COLONOSCOPY  WITH PROPOFOL N/A 11/25/2019   Procedure: COLONOSCOPY WITH PROPOFOL;  Surgeon: Midge Minium, MD;  Location: [facility];  Service: Endoscopy;  Laterality: N/A;  priority 4   POLYPECTOMY  10/27/2014   Procedure: POLYPECTOMY;  Surgeon: Midge Minium, MD;  Location: [facility];  Service: Endoscopy;;   POLYPECTOMY  11/25/2019   Procedure: POLYPECTOMY;  Surgeon: Midge Minium, MD;  Location: [facility];  Service: Endoscopy;;    Current Medications: Current Meds  Medication Sig   aspirin EC 81 MG tablet Take 1 tablet (81 mg total) by mouth daily. Swallow whole.   latanoprost (XALATAN) 0.005 % ophthalmic solution 1 drop at bedtime.   losartan (COZAAR) 25 MG tablet Take 0.5 tablets (12.5 mg total) by mouth daily. Please contact office to schedule follow up prior to further refills   [DISCONTINUED] atorvastatin (LIPITOR) 40 MG tablet TAKE 1 TABLET DAILY (STOPPING SIMVASTATIN)    Allergies:   Patient has no known allergies.   Social History   Socioeconomic History   Marital status: Married    Spouse name: Not on file   Number of children: 2   Years of education: Not on file   Highest education level: Associate degree: academic program  Occupational History   Occupation: Psychologist, sport and exercise  Tobacco Use   Smoking status: Former    Current packs/day: 0.00    Average packs/day: 1 pack/day for 55.0 years (55.0 ttl pk-yrs)    Types: Cigarettes    Start date: 07/01/1965    Quit date: 07/01/2020    Years since quitting: 2.3   Smokeless tobacco: Never   Tobacco comments:    smoking cessation information given  Vaping Use   Vaping status: Never Used  Substance and Sexual Activity   Alcohol use: Not Currently   Drug use: Never   Sexual activity: Not on file  Other Topics Concern   Not on file  Social History Narrative   Not on file   Social Determinants of Health   Financial Resource Strain: Low Risk  (04/30/2022)   Overall Financial Resource Strain (CARDIA)     Difficulty of Paying Living Expenses: Not hard at all  Food Insecurity: No Food Insecurity (04/30/2022)   Hunger Vital Sign    Worried About Running Out of Food in the Last Year: Never true    Ran Out of Food in the Last Year: Never true  Transportation Needs: No Transportation Needs (04/30/2022)   PRAPARE - Administrator, Civil Service (Medical): No    Lack of Transportation (Non-Medical): No  Physical Activity: Sufficiently Active (04/30/2022)   Exercise Vital Sign    Days of Exercise per Week: 5 days    Minutes of Exercise per Session: 30 min  Recent Concern: Physical Activity - Insufficiently Active (04/30/2022)   Exercise Vital Sign    Days of Exercise per Week: 2 days    Minutes of Exercise per Session: 20 min  Stress: No Stress Concern Present (04/30/2022)   [employer] of Occupational Health - Occupational Stress Questionnaire    Feeling of Stress : Not at all  Social Connections: Socially Integrated (04/30/2022)   Social Connection and Isolation  Panel [NHANES]    Frequency of Communication with Friends and Family: Once a week    Frequency of Social Gatherings with Friends and Family: Twice a week    Attends Religious Services: More than 4 times per year    Active Member of [employer] or Organizations: Yes    Attends Engineer, structural: More than 4 times per year    Marital Status: Married     Family History:  The patient's family history includes CAD in his father; Heart disease in his father; Hyperlipidemia in his father. There is no history of Prostate cancer, Bladder Cancer, or Kidney cancer.  ROS:   12-point review of systems is negative unless otherwise noted in the HPI.   EKGs/Labs/Other Studies Reviewed:    Studies reviewed were summarized above. The additional studies were reviewed today:  CTA aorta 09/23/2022: IMPRESSION: Grossly stable 5.2 cm distal thoracic aortic arch aneurysm. Recommend semi-annual imaging followup by CTA or MRA and  referral to cardiothoracic surgery if not already obtained. This recommendation follows 2010 ACCF/AHA/AATS/ACR/ASA/SCA/SCAI/SIR/STS/SVM Guidelines for the Diagnosis and Management of Patients With Thoracic Aortic Disease. Circulation. 2010; 121: Z610-R60. Aortic aneurysm NOS (ICD10-I71.9).   Grossly stable 3.5 cm distal descending thoracic aortic aneurysm. Also noted is stable probable penetrating atherosclerotic ulcer in distal descending thoracic aorta. Grossly stable small saccular aneurysm arising from proximal portion of left subclavian artery. Attention to these abnormalities on follow-up imaging is recommended.   Grossly stable 4.5 cm proximal infrarenal abdominal aortic aneurysm. Grossly stable 4.1 cm aneurysm of distal descending thoracic aorta. Recommend follow-up CT/MR every 6 months and vascular consultation. This recommendation follows ACR consensus guidelines: White Paper of the ACR Incidental Findings Committee II on Vascular Findings. J Am Coll Radiol 2013; 10:789-794.   Grossly stable bilateral common iliac artery aneurysms.   Chronic pancreatitis.   Mild coronary artery calcifications are noted.   Diverticulosis of descending and sigmoid colon is noted without inflammation.   Aortic Atherosclerosis (ICD10-I70.0) and Emphysema __________  2D echo 02/05/2021: 1. Left ventricular ejection fraction, by estimation, is 60 to 65%. The  left ventricle has normal function. The left ventricle has no regional  wall motion abnormalities. Left ventricular diastolic parameters are  consistent with Grade I diastolic  dysfunction (impaired relaxation).   2. Right ventricular systolic function is normal. The right ventricular  size is normal. Tricuspid regurgitation signal is inadequate for assessing  PA pressure.   3. The mitral valve is normal in structure. No evidence of mitral valve  regurgitation. No evidence of mitral stenosis.   4. The aortic valve was not well  visualized. Aortic valve regurgitation  is not visualized. Aortic valve sclerosis/calcification is present,  without any evidence of aortic stenosis.   5. There is borderline dilatation of the aortic root and of the ascending  aorta, measuring 37 mm.   6. The inferior vena cava is normal in size with greater than 50%  respiratory variability, suggesting right atrial pressure of 3 mmHg.   7. Challenging images, definity used.   Comparison(s): LVEF 60-65%. __________   Carotid artery ultrasound 01/02/2021: IMPRESSION: 1. Right carotid artery system: Less than 50% stenosis secondary to mild scattered atherosclerotic plaque formation.   2. Left carotid artery system: Less than 50% stenosis secondary to mild scattered atherosclerotic plaque formation.   3.  Vertebral artery system: Patent with antegrade flow bilaterally.   EKG:  EKG is ordered today.  The EKG ordered today demonstrates NSR, 69 bpm, no acute st/t changes  Recent  Labs: 02/14/2022: ALT 19; BUN 17; Creatinine, Ser 0.94; Hemoglobin 15.4; Platelets 281; Potassium 4.6; Sodium 141; TSH 2.120  Recent Lipid Panel    Component Value Date/Time   CHOL 121 02/14/2022 1024   TRIG 74 02/14/2022 1024   HDL 47 02/14/2022 1024   CHOLHDL 2.6 02/14/2022 1024   CHOLHDL 2.5 07/19/2021 0927   VLDL 15 07/19/2021 0927   LDLCALC 59 02/14/2022 1024    PHYSICAL EXAM:    VS:  BP 134/88 (BP Location: Left Arm, Patient Position: Sitting, Cuff Size: Normal)   Pulse 69   Ht 6' (1.829 m)   Wt 231 lb 3.2 oz (104.9 kg)   SpO2 93%   BMI 31.36 kg/m   BMI: Body mass index is 31.36 kg/m.  Physical Exam Vitals reviewed.  Constitutional:      Appearance: He is well-developed.  HENT:     Head: Normocephalic and atraumatic.  Eyes:     General:        Right eye: No discharge.        Left eye: No discharge.  Neck:     Vascular: No JVD.  Cardiovascular:     Rate and Rhythm: Normal rate and regular rhythm.     Heart sounds: Normal heart  sounds, S1 normal and S2 normal. Heart sounds not distant. No midsystolic click and no opening snap. No murmur heard.    No friction rub.  Pulmonary:     Effort: Pulmonary effort is normal. No respiratory distress.     Breath sounds: Normal breath sounds. No decreased breath sounds, wheezing or rales.  Chest:     Chest wall: No tenderness.  Abdominal:     General: There is no distension.  Musculoskeletal:     Cervical back: Normal range of motion.     Right lower leg: No edema.     Left lower leg: No edema.  Skin:    General: Skin is warm and dry.     Nails: There is no clubbing.  Neurological:     Mental Status: He is alert and oriented to person, place, and time.  Psychiatric:        Speech: Speech normal.        Behavior: Behavior normal.        Thought Content: Thought content normal.        Judgment: Judgment normal.     Wt Readings from Last 3 Encounters:  10/24/22 231 lb 3.2 oz (104.9 kg)  09/23/22 229 lb (103.9 kg)  04/30/22 236 lb 6.4 oz (107.2 kg)     ASSESSMENT & PLAN:   Nonobstructive CAD: He is doing very well and without symptoms concerning for angina or cardiac decompensation.  He remains very active at baseline without cardiac limitation.  Continue aggressive risk factor modification and primary prevention including aspirin and atorvastatin.  No indication for further ischemic testing at this time.  Should he have progression of his aneurysmal disease and require intervention, he would likely need R/LHC prior to this.  Thoracic and abdominal aortic aneurysms, right common iliac artery rhythm, and left subclavian artery aneurysm: Stable on imaging in 09/2022.  Followed by CVTS and vascular surgery.  Has not followed up with vascular surgery since 01/2021.  Needs follow-up with vascular surgery.  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  Remains on  atorvastatin.  Elevated blood pressure: Reasonably controlled in the office and well-controlled at home.  Remains on  low-dose losartan.   Disposition: F/u with Dr. Okey Dupre or an APP in 12 months.   Medication Adjustments/Labs and Tests Ordered: Current medicines are reviewed at length with the patient today.  Concerns regarding medicines are outlined above. Medication changes, Labs and Tests ordered today are summarized above and listed in the Patient Instructions accessible in Encounters.   Signed, Eula Listen, PA-C 10/24/2022 4:08 PM     [facility] [place] [address] [place], [place] [address] [phone]

## 2022-10-24 NOTE — Patient Instructions (Signed)
Medication Instructions:  Your Physician recommend you continue on your current medication as directed.    *If you need a refill on your cardiac medications before your next appointment, please call your pharmacy*  Lab Work: None If you have labs (blood work) drawn today and your tests are completely normal, you will receive your results only by: MyChart Message (if you have MyChart) OR A paper copy in the mail If you have any lab test that is abnormal or we need to change your treatment, we will call you to review the results.  Testing/Procedures: None  Follow-Up: At [facility], you and your health needs are our priority.  As part of our continuing mission to provide you with exceptional heart care, we have created designated Provider Care Teams.  These Care Teams include your primary Cardiologist (physician) and Advanced Practice Providers (APPs -  Physician Assistants and Nurse Practitioners) who all work together to provide you with the care you need, when you need it.  We recommend signing up for the patient portal called "MyChart".  Sign up information is provided on this After Visit Summary.  MyChart is used to connect with patients for Virtual Visits (Telemedicine).  Patients are able to view lab/test results, encounter notes, upcoming appointments, etc.  Non-urgent messages can be sent to your provider as well.   To learn more about what you can do with MyChart, go to [email or web address].    Your next appointment:   12 month(s)  Provider:   You may see Yvonne Kendall, MD or one of the following Advanced Practice Providers on your designated Care Team:   Eula Listen, [place]

## 2022-11-05 ENCOUNTER — Ambulatory Visit: Payer: Medicare Other | Admitting: Physician Assistant

## 2022-12-22 ENCOUNTER — Other Ambulatory Visit: Payer: Self-pay | Admitting: Internal Medicine

## 2022-12-22 DIAGNOSIS — R4189 Other symptoms and signs involving cognitive functions and awareness: Secondary | ICD-10-CM

## 2023-01-01 ENCOUNTER — Other Ambulatory Visit: Payer: Self-pay

## 2023-01-01 MED ORDER — ATORVASTATIN CALCIUM 40 MG PO TABS: 40.0000 mg | ORAL_TABLET | Freq: Every day | ORAL | 2 refills | Status: DC

## 2023-02-26 ENCOUNTER — Other Ambulatory Visit: Payer: Self-pay | Admitting: Thoracic Surgery (Cardiothoracic Vascular Surgery)

## 2023-02-26 DIAGNOSIS — I7121 Aneurysm of the ascending aorta, without rupture: Secondary | ICD-10-CM

## 2023-03-11 ENCOUNTER — Ambulatory Visit
Admission: EM | Admit: 2023-03-11 | Discharge: 2023-03-11 | Disposition: A | Discharge status: Home / Self Care | Payer: Medicare Other

## 2023-03-11 DIAGNOSIS — M545 Low back pain, unspecified: Secondary | ICD-10-CM

## 2023-03-11 DIAGNOSIS — M5416 Radiculopathy, lumbar region: Secondary | ICD-10-CM | POA: Diagnosis not present

## 2023-03-11 MED ORDER — MELOXICAM 7.5 MG PO TABS: 7.5000 mg | ORAL_TABLET | Freq: Every day | ORAL | 0 refills | Status: DC | PRN

## 2023-03-11 MED ORDER — KETOROLAC TROMETHAMINE 60 MG/2ML IM SOLN
30.0000 mg | Freq: Once | INTRAMUSCULAR | Status: AC
  Administered 2023-03-11: 30 mg via INTRAMUSCULAR

## 2023-03-11 NOTE — ED Provider Notes (Signed)
[facility]    CSN: [id number] Arrival date & time: 03/11/23  1342      History   Chief Complaint Chief Complaint  Patient presents with   Back Pain    HPI Mark Hooper is a 78 y.o. male presenting alone today for 2 month history of right lower back pain radiating to right anterior thigh.  Denies injury. He states, " was playing golf 7 days a week before this."  Patient reports increased pain when bearing weight on the right leg and raising the right leg.  Increased pain with forward flexion.  Denies much discomfort with extension of back.  No leg weakness, numbness or loss of bowel or bladder control.    He has a medical history significant for thoracic and abdominal aortic aneurysms, CHF, DVT, glaucoma, hyperlipidemia, and new diagnosis of mild dementia.  Patient was seen by Dr. Malvin Johns, neurology 2 days ago for new diagnosis of mild dementia.  He was started on Aricept 2 days ago.  He was also started on gabapentin 100 mg twice daily x 1 week then increase to 200 mg twice daily for lumbar back pain.  Patient says it has not helped. Has also tried ibuprofen and tylenol without relief.  HPI  Past Medical History:  Diagnosis Date   Abdominal aortic aneurysm (AAA) (HCC) 02/02/2018   Aneurysm, aortic (HCC)    Basal cell carcinoma 2016   Benign neoplasm of ascending colon    Benign neoplasm of descending colon    Benign neoplasm of sigmoid colon    CHF (congestive heart failure) (HCC)    Diverticulitis    DVT (deep venous thrombosis) (HCC)    Glaucoma    Hx of abdominal abscess 07/29/2013   2015 - diverticular abscess   Hypercholesteremia    Polyp of sigmoid colon    Rectal polyp    Thoracic aortic atherosclerosis (HCC) 02/02/2018   Wears dentures    partial upper    Patient Active Problem List   Diagnosis Date Noted   Prediabetes 02/14/2022   Essential hypertension 10/09/2021   Hypercalcemia 04/05/2021   Bilateral carotid artery stenosis 01/21/2021    Centrilobular emphysema (HCC) 01/21/2021   Fatigue 01/04/2021   Dyspnea on exertion 01/04/2021   Chest pain 01/04/2021   Thoracic aortic aneurysm without rupture (HCC) 12/31/2020   Pulmonary nodules/lesions, multiple 02/12/2018   Thoracic aortic atherosclerosis (HCC) 02/02/2018   Abdominal aortic aneurysm (AAA) without rupture 02/02/2018   Aortic dilatation (HCC) 10/14/2017   Benign prostatic hyperplasia with incomplete bladder emptying 02/05/2015   Hx of colonic polyps    Tobacco use disorder 09/01/2014   Basal cell carcinoma of nose 08/29/2014   Mixed hyperlipidemia 08/29/2014   History of malignant melanoma of skin 09/08/2013    Past Surgical History:  Procedure Laterality Date   basal cell cancer excision     CATARACT EXTRACTION     COLONOSCOPY     COLONOSCOPY WITH PROPOFOL N/A 10/27/2014   Procedure: COLONOSCOPY WITH PROPOFOL;  Surgeon: Midge Minium, MD;  Location: [facility];  Service: Endoscopy;  Laterality: N/A;   COLONOSCOPY WITH PROPOFOL N/A 11/25/2019   Procedure: COLONOSCOPY WITH PROPOFOL;  Surgeon: Midge Minium, MD;  Location: [facility];  Service: Endoscopy;  Laterality: N/A;  priority 4   POLYPECTOMY  10/27/2014   Procedure: POLYPECTOMY;  Surgeon: Midge Minium, MD;  Location: [facility];  Service: Endoscopy;;   POLYPECTOMY  11/25/2019   Procedure: POLYPECTOMY;  Surgeon: Midge Minium, MD;  Location: [facility];  Service: Endoscopy;;       Home Medications    Prior to Admission medications   Medication Sig Start Date End Date Taking? Authorizing Provider  aspirin EC 81 MG tablet Take 1 tablet (81 mg total) by mouth daily. Swallow whole. 01/04/21  Yes End, Cristal Deer, MD  atorvastatin (LIPITOR) 40 MG tablet Take 1 tablet (40 mg total) by mouth daily. 01/01/23  Yes Dunn, Raymon Mutton, PA-C  donepezil (ARICEPT) 5 MG tablet Take 1 tablet by mouth at bedtime. 03/09/23 03/08/24 Yes [provider]  gabapentin (NEURONTIN) 100 MG capsule  Take 100 mg twice daily for 1 week, then increase to 200 mg twice daily for lumbar back pain. 03/09/23  Yes [provider]  latanoprost (XALATAN) 0.005 % ophthalmic solution 1 drop at bedtime.   Yes [provider]  losartan (COZAAR) 25 MG tablet Take 0.5 tablets (12.5 mg total) by mouth daily. Please contact office to schedule follow up prior to further refills 07/11/22  Yes End, Cristal Deer, MD  meloxicam (MOBIC) 7.5 MG tablet Take 1 tablet (7.5 mg total) by mouth daily as needed for pain. 03/11/23 04/10/23 Yes Shirlee Latch, PA-C    Family History Family History  Problem Relation Age of Onset   Heart disease Father        CABG x 3   Hyperlipidemia Father    CAD Father    Prostate cancer Neg Hx    Bladder Cancer Neg Hx    Kidney cancer Neg Hx     Social History Social History   Tobacco Use   Smoking status: Former    Current packs/day: 0.00    Average packs/day: 1 pack/day for 55.0 years (55.0 ttl pk-yrs)    Types: Cigarettes    Start date: 07/01/1965    Quit date: 07/01/2020    Years since quitting: 2.6   Smokeless tobacco: Never   Tobacco comments:    smoking cessation information given  Vaping Use   Vaping status: Never Used  Substance Use Topics   Alcohol use: Not Currently   Drug use: Never     Allergies   Patient has no known allergies.   Review of Systems Review of Systems  Respiratory:  Negative for shortness of breath.   Cardiovascular:  Negative for chest pain.  Gastrointestinal:  Negative for abdominal pain.  Musculoskeletal:  Positive for back pain. Negative for arthralgias and gait problem.  Neurological:  Negative for dizziness, weakness and numbness.     Physical Exam Triage Vital Signs ED Triage Vitals  Encounter Vitals Group     BP 03/11/23 1349 135/79     Systolic BP Percentile --      Diastolic BP Percentile --      Pulse Rate 03/11/23 1349 65     Resp 03/11/23 1349 16     Temp 03/11/23 1349 98.2 F (36.8 C)     Temp  Source 03/11/23 1349 Oral     SpO2 03/11/23 1349 98 %     Weight 03/11/23 1348 220 lb (99.8 kg)     Height 03/11/23 1348 6' (1.829 m)     Head Circumference --      Peak Flow --      Pain Score 03/11/23 1353 10     Pain Loc --      Pain Education --      Exclude from Growth Chart --    No data found.  Updated Vital Signs BP 135/79 (BP Location: Left  Arm)   Pulse 65   Temp 98.2 F (36.8 C) (Oral)   Resp 16   Ht 6' (1.829 m)   Wt 220 lb (99.8 kg)   SpO2 98%   BMI 29.84 kg/m    Physical Exam Vitals and nursing note reviewed.  Constitutional:      General: He is not in acute distress.    Appearance: Normal appearance. He is well-developed. He is not ill-appearing.  HENT:     Head: Normocephalic and atraumatic.  Eyes:     General: No scleral icterus.    Conjunctiva/sclera: Conjunctivae normal.  Cardiovascular:     Rate and Rhythm: Normal rate and regular rhythm.  Pulmonary:     Effort: Pulmonary effort is normal. No respiratory distress.     Breath sounds: Normal breath sounds.  Musculoskeletal:     Cervical back: Neck supple.     Lumbar back: Tenderness (right paralumbar muscles) present. Decreased range of motion. Positive right straight leg raise test. Negative left straight leg raise test.  Skin:    General: Skin is warm and dry.     Capillary Refill: Capillary refill takes less than 2 seconds.  Neurological:     General: No focal deficit present.     Mental Status: He is alert. Mental status is at baseline.     Motor: No weakness.     Gait: Gait normal.  Psychiatric:        Mood and Affect: Mood normal.      UC Treatments / Results  Labs (all labs ordered are listed, but only abnormal results are displayed) Labs Reviewed - No data to display  EKG   Radiology No results found.  Procedures Procedures (including critical care time)  Medications Ordered in UC Medications  ketorolac (TORADOL) injection 30 mg (has no administration in time range)     Initial Impression / Assessment and Plan / UC Course  I have reviewed the triage vital signs and the nursing notes.  Pertinent labs & imaging results that were available during my care of the patient were reviewed by me and considered in my medical decision making (see chart for details).   78 year old male presents alone for back pain of lumbar area x 2 months. No injury.  Patient has a medical history significant for thoracic and abdominal aneurysms, mild dementia/cognitive impairment, hyperlipidemia and DVT.  No red flag signs or symptoms for back pain.  Vitals are normal and stable and he is overall well-appearing. Has reduced ROM of back. Has TTP right paralumbar muscles and +SLR.   Reviewed note from Dr. Malvin Johns, neurology from 2 days ago.  For complaint of back pain, patient was started on 100 mg gabapentin twice daily x 1 week and advised to increase to 200 mg twice daily thereafter.  Also started on Aricept for new diagnosis of mild dementia. He says he is already got up to the higher dose and it did not help.  Also taking ibuprofen and Tylenol without relief.  Lumbar strain, likely lumbar to colopathy.  Patient given 30 mg IM ketorolac in clinic for acute pain relief.  Sent 7.5 mg daily meloxicam as needed.  Advised to continue gabapentin and exercise Tylenol, heat, ice, stretching.  Reviewed following up with orthopedics for MRI as he would likely benefit from corticosteroid ESI.  Also discussed following up with physical therapy. Information provided in handout.  Thoroughly reviewed return and ED precautions.   Final Clinical Impressions(s) / UC Diagnoses   Final diagnoses:  Acute right-sided low back pain, unspecified whether sciatica present  Lumbar radiculopathy     Discharge Instructions      BACK PAIN: You have a pinched nerve. Avoid painful activities. May alternate ice and heat. Consider use of muscle rubs, Salonpas patches, etc. Continue Gabapentin. Take Meloxicam  daily as needed. May also take Extra Strength Tylenol. F/u with Orthopedics and PT. See below.   BACK PAIN RED FLAGS: If the back pain acutely worsens or there are any red flag symptoms such as numbness/tingling, leg weakness, saddle anesthesia, or loss of bowel/bladder control, go immediately to the ER. Follow up with [place] as scheduled or sooner if the pain does not begin to resolve or if it worsens before the follow up    You have a condition requiring you to follow up with Orthopedics so please call one of the following office for appointment:   [facility] Address: [address], [place], [place] [address] Phone: [phone]  [facility] [address], [place], [place] [address] Phone: [phone]  [facility] [address] [place], [place] [address] Phone: [phone]   Please follow up with physical therapy: Contact them below  [facility] [address] [place], [place] [address] Phone: [phone]     ED Prescriptions     Medication Sig Dispense Auth. Provider   meloxicam (MOBIC) 7.5 MG tablet Take 1 tablet (7.5 mg total) by mouth daily as needed for pain. 30 tablet Shirlee Latch, PA-C      I have reviewed the PDMP during this encounter.   Shirlee Latch, PA-C 03/11/23 1429

## 2023-03-11 NOTE — ED Triage Notes (Signed)
Pt c/o lower back pain x1 mon. Denies any fall or injuries. Saw neuro on 1/20 & was given gabapentin w/o relief.

## 2023-03-11 NOTE — Discharge Instructions (Signed)
BACK PAIN: You have a pinched nerve. Avoid painful activities. May alternate ice and heat. Consider use of muscle rubs, Salonpas patches, etc. Continue Gabapentin. Take Meloxicam daily as needed. May also take Extra Strength Tylenol. F/u with Orthopedics and PT. See below.   BACK PAIN RED FLAGS: If the back pain acutely worsens or there are any red flag symptoms such as numbness/tingling, leg weakness, saddle anesthesia, or loss of bowel/bladder control, go immediately to the ER. Follow up with [place] as scheduled or sooner if the pain does not begin to resolve or if it worsens before the follow up    You have a condition requiring you to follow up with Orthopedics so please call one of the following office for appointment:   [facility] Address: [address], [place], [place] [address] Phone: [phone]  [facility] [address] [place], [place] [address] Phone: [phone]  [facility] [address], [place], [place] [address] Phone: [phone]   Please follow up with physical therapy: Contact them below  [facility] [address], [place] [address] Phone: [phone]

## 2023-03-23 ENCOUNTER — Telehealth: Payer: Self-pay | Admitting: Internal Medicine

## 2023-03-23 NOTE — Telephone Encounter (Unsigned)
Copied from CRM [id number]. Topic: Medicare AWV >> Mar 23, 2023 11:21 AM Payton Doughty wrote: Reason for CRM: LVM 03/23/23 to r/s 05/06/23 AWV appt due to [facility] sched change. Please call back to r/s appt  Verlee Rossetti; Care Guide Ambulatory Clinical Support [place] [facility] Direct Dial: [phone]

## 2023-04-07 ENCOUNTER — Encounter: Payer: Self-pay | Admitting: Thoracic Surgery (Cardiothoracic Vascular Surgery)

## 2023-04-07 ENCOUNTER — Ambulatory Visit
Admission: RE | Admit: 2023-04-07 | Discharge: 2023-04-07 | Disposition: A | Discharge status: Home / Self Care | Payer: Medicare Other | Source: Ambulatory Visit | Attending: Thoracic Surgery (Cardiothoracic Vascular Surgery) | Admitting: Thoracic Surgery (Cardiothoracic Vascular Surgery)

## 2023-04-07 ENCOUNTER — Ambulatory Visit (INDEPENDENT_AMBULATORY_CARE_PROVIDER_SITE_OTHER): Payer: Medicare Other | Admitting: Thoracic Surgery (Cardiothoracic Vascular Surgery)

## 2023-04-07 VITALS — BP 130/87 | HR 68 | Resp 18 | Ht 72.0 in | Wt 238.0 lb

## 2023-04-07 DIAGNOSIS — I7121 Aneurysm of the ascending aorta, without rupture: Secondary | ICD-10-CM

## 2023-04-07 MED ORDER — IOPAMIDOL (ISOVUE-370) INJECTION 76%
75.0000 mL | Freq: Once | INTRAVENOUS | Status: AC | PRN
  Administered 2023-04-07: 75 mL via INTRAVENOUS

## 2023-04-07 NOTE — Progress Notes (Signed)
 [address]       [place] Ho[place] [id number]             [phone]       HPI: Mark Hooper returns for follow-up of his aneurysmal disease.  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  First found to have aneurysms after a syncopal spell at [facility] airport in 2019.  Last saw Mark Hooper in August 2024.  His aneurysms were stable.  I did recommend at that time that Mark Hooper needed to see Dr. Karin Lieu who took over for Dr. Arbie Cookey.  Mark Hooper has not done that yet.  Has been feeling well.  Was recently started on Aricept for dementia.  Still plays golf on a regular basis.  Has had to move up a set of tees.  Past Medical History:  Diagnosis Date   Abdominal aortic aneurysm (AAA) (HCC) 02/02/2018   Aneurysm, aortic (HCC)    Basal cell carcinoma 2016   Benign neoplasm of ascending colon    Benign neoplasm of descending colon    Benign neoplasm of sigmoid colon    CHF (congestive heart failure) (HCC)    Diverticulitis    DVT (deep venous thrombosis) (HCC)    Glaucoma    Hx of abdominal abscess 07/29/2013   2015 - diverticular abscess   Hypercholesteremia    Polyp of sigmoid colon    Rectal polyp    Thoracic aortic atherosclerosis (HCC) 02/02/2018   Wears dentures    partial upper    Current Outpatient Medications  Medication Sig Dispense Refill   aspirin EC 81 MG tablet Take 1 tablet (81 mg total) by mouth daily. Swallow whole.     atorvastatin (LIPITOR) 40 MG tablet Take 1 tablet (40 mg total) by mouth daily. 90 tablet 2   donepezil (ARICEPT) 5 MG tablet Take 1 tablet by mouth at bedtime.     latanoprost (XALATAN) 0.005 % ophthalmic solution 1 drop at bedtime.     losartan (COZAAR) 25 MG tablet Take 0.5 tablets (12.5 mg total) by mouth daily. Please contact  office to schedule follow up prior to further refills 45 tablet 3   No current facility-administered medications for this visit.    Physical Exam BP 130/87   Pulse 68   Resp 18   Ht 6' (1.829 m)   Wt 238 lb (108 kg)   SpO2 95% Comment: RA  BMI 32.28 kg/m  Obese 78 year old man in no acute distress Alert and oriented x 3 with no focal deficits Lungs clear bilaterally Cardiac regular rate and rhythm, no murmur Abdomen obese soft  Diagnostic Tests: CT chest has not yet been read. I personally reviewed the films.  There is no obvious change in the 5.2 cm proximal descending aneurysm, 3.3 cm distal descending aneurysm, 4.5 cm infrarenal AAA, or 4.7 cm iliac aneurysm.  Diffuse atherosclerotic disease with penetrating ulcers.  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  Descending thoracic aortic aneurysms/thoracic arctic atherosclerosis/penetrating atherosclerotic ulcers-stable and asymptomatic.  Importance of blood pressure control emphasized.  Needs continued semiannual follow-up.  Infrarenal abdominal and iliac aneurysms-has not seen vascular surgery in a couple of years.  I again will try to arrange an appointment with Dr. Karin Lieu who took over for Dr. Arbie Cookey.  Mark Hooper did see Mark Hooper in 2022.  I did inform Mark Hooper that if Mark Hooper would rather just follow-up with [facility] Mark Hooper can do so but Mark Hooper needs to be seeing at least 1 of [place] every 6 months.  Blood pressure well-controlled currently.  Has a cuff at home but does not use it.  Recommended that Mark Hooper check himself periodically.  Plan: Follow-up with Dr. Karin Lieu Return in 6 months with CT angiogram of chest  I spent over 20 minutes in review of records, images, and in consultation with Mark Hooper  today. Loreli Slot, MD [facility] [phone]

## 2023-04-29 NOTE — Progress Notes (Deleted)
 Office Note     CC: Carotid artery stenosis Requesting Provider:  [name], MD  HPI: Mark Hooper is a 78 y.o. (27-Feb-1945) male presenting at the request of .[name], MD.  Mark Hooper was recently seen by his PCP with right neck pain and right carotid bruit, prompting carotid duplex ultrasonography and appointment with vascular surgery.  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 Dorris Fetch and Mark Hooper.  Mark Hooper was last seen in November by Dr. Dorris Fetch with 32-month CT that demonstrated no appreciable changes to his multiple aneurysms.  On exam today, Mark Hooper was doing well.  Mark Hooper denied new onset chest, abdominal, back pain.  Last month, Mark Hooper appreciated some chest pain however this resolved. Mark Hooper is currently scheduled for an echocardiogram on 02/07/2021.  Mark Hooper denied symptoms of claudication, rest pain, tissue loss.    The pt is  on a statin for cholesterol management.  The pt is  on a daily aspirin.   Other AC:  - The pt is  on medication for hypertension.   The pt is not diabetic.  Tobacco hx:  former  Past Medical History:  Diagnosis Date   Abdominal aortic aneurysm (AAA) (HCC) 02/02/2018   Aneurysm, aortic (HCC)    Basal cell carcinoma 2016   Benign neoplasm of ascending colon    Benign neoplasm of descending colon    Benign neoplasm of sigmoid colon    CHF (congestive heart failure) (HCC)    Diverticulitis    DVT (deep venous thrombosis) (HCC)    Glaucoma    Hx of abdominal abscess 07/29/2013   2015 - diverticular abscess   Hypercholesteremia    Polyp of sigmoid colon    Rectal polyp    Thoracic aortic atherosclerosis (HCC) 02/02/2018   Wears dentures    partial upper    Past Surgical History:  Procedure Laterality Date   basal cell cancer excision     CATARACT EXTRACTION      COLONOSCOPY     COLONOSCOPY WITH PROPOFOL N/A 10/27/2014   Procedure: COLONOSCOPY WITH PROPOFOL;  Surgeon: Midge Minium, MD;  Location: [facility];  Service: Endoscopy;  Laterality: N/A;   COLONOSCOPY WITH PROPOFOL N/A 11/25/2019   Procedure: COLONOSCOPY WITH PROPOFOL;  Surgeon: Midge Minium, MD;  Location: [facility];  Service: Endoscopy;  Laterality: N/A;  priority 4   POLYPECTOMY  10/27/2014   Procedure: POLYPECTOMY;  Surgeon: Midge Minium, MD;  Location: [facility];  Service: Endoscopy;;   POLYPECTOMY  11/25/2019   Procedure: POLYPECTOMY;  Surgeon: Midge Minium, MD;  Location: [facility];  Service: Endoscopy;;    Social History   Socioeconomic History   Marital status: Married    Spouse name: Not on file   Number of children: 2   Years of education: Not on file   Highest education level: Associate degree: academic program  Occupational History   Occupation: business owner  Tobacco Use   Smoking status: Former    Current packs/day: 0.00    Average packs/day: 1 pack/day for 55.0 years (55.0 ttl pk-yrs)    Types: Cigarettes    Start date: 07/01/1965    Quit date: 07/01/2020    Years since quitting: 2.8   Smokeless tobacco: Never   Tobacco comments:    smoking cessation information given  Vaping Use  Vaping status: Never Used  Substance and Sexual Activity   Alcohol use: Not Currently   Drug use: Never   Sexual activity: Not on file  Other Topics Concern   Not on file  Social History Narrative   Not on file   Social Drivers of Health   Financial Resource Strain: Low Risk  (04/30/2022)   Overall Financial Resource Strain (CARDIA)    Difficulty of Paying Living Expenses: Not hard at all  Food Insecurity: No Food Insecurity (04/30/2022)   Hunger Vital Sign    Worried About Running Out of Food in the Last Year: Never true    Ran Out of Food in the Last Year: Never true  Transportation Needs: No Transportation Needs (04/30/2022)   PRAPARE -  Administrator, Civil Service (Medical): No    Lack of Transportation (Non-Medical): No  Physical Activity: Sufficiently Active (04/30/2022)   Exercise Vital Sign    Days of Exercise per Week: 5 days    Minutes of Exercise per Session: 30 min  Recent Concern: Physical Activity - Insufficiently Active (04/30/2022)   Exercise Vital Sign    Days of Exercise per Week: 2 days    Minutes of Exercise per Session: 20 min  Stress: No Stress Concern Present (04/30/2022)   [employer] of Occupational Health - Occupational Stress Questionnaire    Feeling of Stress : Not at all  Social Connections: Socially Integrated (04/30/2022)   Social Connection and Isolation Panel [NHANES]    Frequency of Communication with Friends and Family: Once a week    Frequency of Social Gatherings with Friends and Family: Twice a week    Attends Religious Services: More than 4 times per year    Active Member of [employer] or Organizations: Yes    Attends Engineer, structural: More than 4 times per year    Marital Status: Married  Catering manager Violence: Not At Risk (04/30/2022)   Humiliation, Afraid, Rape, and Kick questionnaire    Fear of Current or Ex-Partner: No    Emotionally Abused: No    Physically Abused: No    Sexually Abused: No    Family History  Problem Relation Age of Onset   Heart disease Father        CABG x 3   Hyperlipidemia Father    CAD Father    Prostate cancer Neg Hx    Bladder Cancer Neg Hx    Kidney cancer Neg Hx     Current Outpatient Medications  Medication Sig Dispense Refill   aspirin EC 81 MG tablet Take 1 tablet (81 mg total) by mouth daily. Swallow whole.     atorvastatin (LIPITOR) 40 MG tablet Take 1 tablet (40 mg total) by mouth daily. 90 tablet 2   donepezil (ARICEPT) 5 MG tablet Take 1 tablet by mouth at bedtime.     latanoprost (XALATAN) 0.005 % ophthalmic solution 1 drop at bedtime.     losartan (COZAAR) 25 MG tablet Take 0.5 tablets (12.5 mg total)  by mouth daily. Please contact office to schedule follow up prior to further refills 45 tablet 3   No current facility-administered medications for this visit.    No Known Allergies   REVIEW OF SYSTEMS:   [X]  denotes positive finding, [ ]  denotes negative finding Cardiac  Comments:  Chest pain or chest pressure:    Shortness of breath upon exertion:    Short of breath when lying flat:    Irregular heart rhythm:  Vascular    Pain in calf, thigh, or hip brought on by ambulation:    Pain in feet at night that wakes you up from your sleep:     Blood clot in your veins:    Leg swelling:         Pulmonary    Oxygen at home:    Productive cough:     Wheezing:         Neurologic    Sudden weakness in arms or legs:     Sudden numbness in arms or legs:     Sudden onset of difficulty speaking or slurred speech:    Temporary loss of vision in one eye:     Problems with dizziness:         Gastrointestinal    Blood in stool:     Vomited blood:         Genitourinary    Burning when urinating:     Blood in urine:        Psychiatric    Major depression:         Hematologic    Bleeding problems:    Problems with blood clotting too easily:        Skin    Rashes or ulcers:        Constitutional    Fever or chills:      PHYSICAL EXAMINATION:  There were no vitals filed for this visit.  General:  WDWN in NAD; vital signs documented above Gait: Not observed HENT: WNL, normocephalic Pulmonary: normal non-labored breathing , without wheezing Cardiac: regular HR, Abdomen: soft, NT, no masses Skin: without rashes Vascular Exam/Pulses:  Right Left  Radial 2+ (normal) 2+ (normal)  Ulnar 2+ (normal) 2+ (normal)  Femoral    Popliteal    DP 2+ (normal) 2+ (normal)  PT     Extremities: without ischemic changes, without Gangrene , without cellulitis; without open wounds;  Musculoskeletal: no muscle wasting or atrophy  Neurologic: A&O X 3;  No focal weakness or  paresthesias are detected Psychiatric:  The pt has Normal affect.   Non-Invasive Vascular Imaging:       ASSESSMENT/PLAN: HUSSAIN MAIMONE is a 78 y.o. male presenting with neck pain and right carotid bruit.  Duplex ultrasonography demonstrated mild carotid atherosclerotic disease, mainly appreciated at the proximal external carotid artery.  Indications for surgical repair are asymptomatic internal carotid artery stenosis greater than 80%, or symptomatic (stroke, TIA, amaurosis) stenosis greater than 50%.    Fortunately, Mark Hooper is asymptomatic with relatively normal internal carotid arteries, devoid of stenosis.  Mark Hooper does not need follow-up imaging.  Mark Hooper was scheduled to see Mark Hooper next month, and therefore Mark Hooper called Mark Hooper and we reviewed Mark Hooper's imaging to save him a visit.  There have been no appreciable changes to the zone 3 thoracic aneurysm, zone 4 thoracic aneurysm, pararenal aortic aneurysm, right common iliac artery aneurysm.  All of these aneurysms remain under the size criteria for elective repair per SVS guidelines except for the right common iliac artery aneurysm.  This aneurysm is unchanged in size.  Mark Hooper is aware of the 4 cm aneurysm which has been managed with short interval surveillance.  Being that this shows no growth Mark Hooper, Mark Hooper, and Mark Hooper agreed to continue monitoring the aneurysm.  Mark Hooper is aware that this aneurysm has an increased risk of rupture as compared to the other areas.  Should this aneurysm grow at the 84-month mark, we will discuss elective repair.  Mark Hooper and Mark Hooper discussed the signs and symptoms of rupture. Mark Hooper was asked to call [phone] immediately should any of these occur Mark Hooper asked that Aldrin to continue his current medical management and that Mark Hooper would see him again in 6 months following repeat CT imaging.  Victorino Sparrow, MD [facility] [phone]

## 2023-04-30 ENCOUNTER — Ambulatory Visit: Payer: Medicare Other | Admitting: Vascular Surgery

## 2023-05-18 ENCOUNTER — Ambulatory Visit (INDEPENDENT_AMBULATORY_CARE_PROVIDER_SITE_OTHER): Admitting: Surgery

## 2023-05-18 ENCOUNTER — Encounter: Payer: Self-pay | Admitting: Surgery

## 2023-05-18 VITALS — BP 132/85 | HR 73 | Temp 98.1°F | Ht 72.0 in | Wt 239.0 lb

## 2023-05-18 DIAGNOSIS — I7142 Juxtarenal abdominal aortic aneurysm, without rupture: Secondary | ICD-10-CM | POA: Diagnosis not present

## 2023-05-18 NOTE — Progress Notes (Signed)
 [facility] [place]  Patient name: Mark Hooper MRN: [id number] DOB: Apr 07, 1945 Sex: male   REASON FOR VISIT:    Follow up  HISOTRY OF PRESENT ILLNESS:    Mark Hooper is a 78 y.o. male who I have been asked to see by Dr. Sherral Hammers for his Marina Goodell aortic aneurysm.  The patient has been followed historically by Dr. Arbie Cookey as well as Dr. Dorris Fetch for thoracic and aortic aneurysmal disease.  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  There is a proximal descending thoracic aorta now measuring 5.3 cm.  The infrarenal abdominal aorta measures 4.9 cm.  Patient has a history of smoking.  He is on a statin for hypercholesterolemia.  He takes a baby aspirin.  His blood pressure is medically managed with an ARB.  He has developed progressively worsening dementia   PAST MEDICAL HISTORY:   Past Medical History:  Diagnosis Date   Abdominal aortic aneurysm (AAA) (HCC) 02/02/2018   Aneurysm, aortic (HCC)    Basal cell carcinoma 2016   Benign neoplasm of ascending colon    Benign neoplasm of descending colon    Benign neoplasm of sigmoid colon    CHF (congestive heart failure) (HCC)    Diverticulitis    DVT (deep venous thrombosis) (HCC)    Glaucoma    Hx of abdominal abscess 07/29/2013   2015 - diverticular abscess   Hypercholesteremia    Polyp of sigmoid colon    Rectal polyp    Thoracic aortic atherosclerosis (HCC) 02/02/2018   Wears dentures    partial upper     FAMILY HISTORY:   Family History  Problem Relation Age of Onset   Heart disease Father        CABG x 3   Hyperlipidemia Father    CAD Father    Prostate cancer Neg Hx    Bladder Cancer Neg Hx    Kidney cancer Neg Hx     SOCIAL HISTORY:   Social History   Tobacco Use   Smoking status: Former    Current packs/day: 0.00    Average packs/day: 1 pack/day for 55.0 years (55.0 ttl pk-yrs)    Types: Cigarettes     Start date: 07/01/1965    Quit date: 07/01/2020    Years since quitting: 2.8   Smokeless tobacco: Never   Tobacco comments:    smoking cessation information given  Substance Use Topics   Alcohol use: Not Currently     ALLERGIES:   No Known Allergies   CURRENT MEDICATIONS:   Current Outpatient Medications  Medication Sig Dispense Refill   aspirin EC 81 MG tablet Take 1 tablet (81 mg total) by mouth daily. Swallow whole.     atorvastatin (LIPITOR) 40 MG tablet Take 1 tablet (40 mg total) by mouth daily. 90 tablet 2   donepezil (ARICEPT) 5 MG tablet Take 1 tablet by mouth at bedtime.     latanoprost (XALATAN) 0.005 % ophthalmic solution 1 drop at bedtime.     losartan (COZAAR) 25 MG tablet Take 0.5 tablets (12.5 mg total) by mouth daily. Please contact office to schedule follow up prior to further refills 45 tablet 3   No current facility-administered medications for this visit.    REVIEW OF SYSTEMS:   [X]  denotes positive finding, [ ]  denotes negative finding Cardiac  Comments:  Chest pain or chest pressure:    Shortness of breath upon exertion:  Short of breath when lying flat:    Irregular heart rhythm:        Vascular    Pain in calf, thigh, or hip brought on by ambulation:    Pain in feet at night that wakes you up from your sleep:     Blood clot in your veins:    Leg swelling:         Pulmonary    Oxygen at home:    Productive cough:     Wheezing:         Neurologic    Sudden weakness in arms or legs:     Sudden numbness in arms or legs:     Sudden onset of difficulty speaking or slurred speech:    Temporary loss of vision in one eye:     Problems with dizziness:         Gastrointestinal    Blood in stool:     Vomited blood:         Genitourinary    Burning when urinating:     Blood in urine:        Psychiatric    Major depression:         Hematologic    Bleeding problems:    Problems with blood clotting too easily:        Skin    Rashes  or ulcers:        Constitutional    Fever or chills:      PHYSICAL EXAM:   Vitals:   05/18/23 1421  BP: 132/85  Pulse: 73  Temp: 98.1 F (36.7 C)  SpO2: 95%  Weight: 239 lb (108.4 kg)  Height: 6' (1.829 m)    GENERAL: The patient is a well-nourished male, in no acute distress. The vital signs are documented above. CARDIAC: There is a regular rate and rhythm.  VASCULAR: Palpable pedal pulses.  Palpable radial pulses. PULMONARY: Non-labored respirations ABDOMEN: Soft and non-tender MUSCULOSKELETAL: There are no major deformities or cyanosis. NEUROLOGIC: No focal weakness or paresthesias are detected. SKIN: There are no ulcers or rashes noted. PSYCHIATRIC: The patient has a normal affect.  STUDIES:   I have reviewed the following CTA: 1. 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 Associated aneurysmal disease of the arch at this level measures 3.8 cm. The ulcer only measured approximately 1.6 cm in 2021 and the aorta at this level measured approximately 3.4 cm in 2022. 2. Contiguous aneurysmal disease of the distal arch and proximal descending thoracic aorta reaching maximum diameter of approximately 5.3 cm which has not changed significantly since last August but shows clear gradual enlargement over time with the same segment measuring approximately 4.4 cm in 2021 and 4.7 cm in 2022. 3. 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 4. 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 This measured approximately 7 x 9 mm in 2021. 5. Gradual enlargement of the abdominal aorta with maximum dimensions just below the renal  arteries of approximately 4.7 x 4.9 cm. At the same level, the aorta measures 4.3 x 4.3 cm in 2022 and 4.5 x 4.8 cm in 2024. 6. The distal abdominal aorta just above the aortic bifurcation is extremely tortuous  and somewhat difficult to measure accurately. Estimated maximum caliber is 4.2 cm compared to 3.8 cm in 2022 and 4.2 cm in 2024. 7. 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 8. Stable fusiform dilatation of the left common iliac artery measuring up to 2.2 cm. 9. Emphysematous lung disease and bilateral mild pulmonary scarring.   Carotid: 1. Right carotid artery system: Less than 50% stenosis secondary to mild scattered atherosclerotic plaque formation.   2. Left carotid artery system: Less than 50% stenosis secondary to mild scattered atherosclerotic plaque formation.   3.  Vertebral artery system: Patent with antegrade flow bilaterally. MEDICAL ISSUES:   Abdominal aortic aneurysm: This does appear to be perirenal.  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  Because of the size of think this is the aneurysm most concerning for rupture.  I have recommended repair.  I think while we are fixing the right common iliac aneurysm his infrarenal or juxtarenal aneurysm needs to be addressed as well.  The best option for repair is going to be a [employer] device with stent extensions into the 4 visceral vessels.  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  I discussed with the patient and his wife the risk for rupture.  We also talked about the risk of surgery which include but are not limited to cardiopulmonary complications, renal disease including renal failure, intestinal ischemia, arterial complications.  We also talked  about the risk of paralysis.  The patient states that he has to clear everything through Dr. Dorris Fetch.  I think this has something to do with his underlying dementia.  I am arranging for a another visit with Dr. Dorris Fetch for discussions regarding intervention.  After that he will follow-up with me in approximately 2 months for hopefully we can schedule him for surgical repair of his aortoiliac aneurysm.  This would be a TAMBI graft as well as a IBE    Durene Cal, IV, MD, FACS [facility] [phone] Pager [phone]

## 2023-05-20 ENCOUNTER — Ambulatory Visit: Admitting: Vascular Surgery

## 2023-06-23 ENCOUNTER — Ambulatory Visit
Attending: Thoracic Surgery (Cardiothoracic Vascular Surgery) | Admitting: Thoracic Surgery (Cardiothoracic Vascular Surgery)

## 2023-06-23 ENCOUNTER — Encounter: Payer: Self-pay | Admitting: Thoracic Surgery (Cardiothoracic Vascular Surgery)

## 2023-06-23 VITALS — BP 163/90 | HR 64 | Resp 20 | Ht 72.0 in | Wt 242.6 lb

## 2023-06-23 DIAGNOSIS — I7121 Aneurysm of the ascending aorta, without rupture: Secondary | ICD-10-CM | POA: Insufficient documentation

## 2023-06-23 NOTE — Progress Notes (Signed)
 [address]       Mark Hooper [id number]             [phone]     HPI: Mr. Mark Hooper returns to discuss some concerns he has about management of his aneurysmal disease.  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  First noted to have aneurysms after a syncopal spell in 2019.  I saw him in February.  His descending thoracic aneurysm and penetrating ulcers were relatively stable.  Recommended 49-month follow-up.  I was concerned about his infrarenal abdominal and iliac aneurysms.  He had previously been seen by Dr. Shirley Douglas and Dr. Rosalva Comber, but had not seen a vascular surgeon in several years.  I referred him back to [facility].  He saw Dr. Charlotte Cookey who recommended stent grafting of his abdominal and iliac aneurysms.  Past Medical History:  Diagnosis Date   Abdominal aortic aneurysm (AAA) (HCC) 02/02/2018   Aneurysm, aortic (HCC)    Basal cell carcinoma 2016   Benign neoplasm of ascending colon    Benign neoplasm of descending colon    Benign neoplasm of sigmoid colon    CHF (congestive heart failure) (HCC)    Diverticulitis    DVT (deep venous thrombosis) (HCC)    Glaucoma    Hx of abdominal abscess 07/29/2013   2015 - diverticular abscess   Hypercholesteremia    Polyp of sigmoid colon    Rectal polyp    Thoracic aortic atherosclerosis (HCC) 02/02/2018   Wears dentures    partial upper    Current Outpatient Medications  Medication Sig Dispense Refill   aspirin  EC 81 MG tablet Take 1 tablet (81 mg total) by mouth daily. Swallow whole.     atorvastatin  (LIPITOR) 40 MG tablet Take 1 tablet (40 mg total) by mouth daily. 90 tablet 2   donepezil (ARICEPT) 5 MG tablet Take 1 tablet by mouth at bedtime.     latanoprost (XALATAN) 0.005 %  ophthalmic solution 1 drop at bedtime.     losartan  (COZAAR ) 25 MG tablet Take 0.5 tablets (12.5 mg total) by mouth daily. Please contact office to schedule follow up prior to further refills 45 tablet 3   No current facility-administered medications for this visit.    Physical Exam BP (!) 163/90 (BP Location: Right Arm, Patient Position: Sitting, Cuff Size: Normal)   Pulse 64   Resp 20   Ht 6' (1.829 m)   Wt 242 lb 9.6 oz (110 kg)   SpO2 95% Comment: RA  BMI 32.64 kg/m  78 year old man in no acute distress  Diagnostic Tests: CT ANGIOGRAPHY CHEST, ABDOMEN AND PELVIS   TECHNIQUE: Non-contrast CT of the chest was initially obtained.   Multidetector CT imaging through the chest, abdomen and pelvis was performed using the standard protocol during bolus administration of intravenous contrast. Multiplanar reconstructed images and MIPs were obtained and reviewed to evaluate the vascular anatomy.   RADIATION DOSE REDUCTION: This exam was performed according to the departmental dose-optimization program which includes automated exposure control, adjustment of the mA and/or kV according to patient size and/or use of iterative reconstruction technique.   CONTRAST:  75mL ISOVUE -370 IOPAMIDOL  (ISOVUE -370) INJECTION 76%   COMPARISON:  Multiple prior studies with the most recent dated 09/23/2018 for and additional CTA studies dating back to 2020.  FINDINGS: CTA CHEST FINDINGS   Cardiovascular: The aortic root measures approximately 3.7-3.9 cm. The ascending thoracic aorta is normal in caliber and measures approximately 3.6 cm. Broad-based inferolateral protruding penetrating ulcer of the aortic arch again noted causing aneurysmal disease measuring up to 3.8 cm. The broad-based penetrating ulcer shows gradual enlargement over time with maximum diameter of 3 cm. This only measured approximately 1.6 cm in 2021 and the aorta at this level measured approximately 3.4 cm in 2022. There  is contiguous aneurysmal disease of the distal arch and proximal descending thoracic aorta reaching maximum diameter of approximately 5.3 cm which has not changed significantly since last August but shows clear gradual enlargement over time with the same segment measuring approximately 4.4 cm in 2021 and 4.7 cm in 2022.   Additional protruding penetrating ulcer disease also noted along the left lateral wall of the distal descending thoracic aorta with contrast opacified protrusion currently measuring approximately 1.1 x 1.7 cm and measuring only 0.7 x 0.9 cm in 2022. Protruding penetrating ulcer along the left aspect of the proximal left subclavian artery also noted measuring approximately 9 x 10 mm. This measured approximately 7 x 9 mm in 2021.   The heart size is stable and within normal limits. No pericardial fluid identified. Calcified coronary plaque again visualized. Central pulmonary arteries are mildly prominent and stable in appearance.   Mediastinum/Nodes: No enlarged mediastinal, hilar, or axillary lymph nodes. Thyroid gland, trachea, and esophagus demonstrate no significant findings.   Lungs/Pleura: Stable emphysematous lung disease and bilateral mild pulmonary scarring. There is no evidence of pulmonary edema, consolidation, pneumothorax, nodule or pleural fluid.   Musculoskeletal: No chest wall abnormality. No acute or significant osseous findings.   Review of the MIP images confirms the above findings.   CTA ABDOMEN AND PELVIS FINDINGS   VASCULAR   Aorta: Gradual enlargement of the abdominal aorta with maximum dimensions just below the renal arteries of approximately 4.7 x 4.9 cm. At the same level, the aorta measures 4.3 x 4.3 cm in 2022 and 4.5 x 4.8 cm in 2024. The distal abdominal aorta just above the aortic bifurcation is extremely tortuous and somewhat difficult to measure accurately. Estimated maximum caliber is 4.2 cm compared to 3.8 cm in 2022 and  4.2 cm in 2024.   Celiac: Normally patent. Normally patent branch vessels and branch vessel anatomy.   SMA: Normally patent.   Renals: 2 separate right and single left renal arteries demonstrate normal patency.   IMA: IMA remains patent.   Inflow: Aneurysmal disease the right common iliac artery measures up to 4.6 cm and shows definite enlargement over time measuring approximately 3.7 cm in 2022 and 4.1 cm in 2024. Stable fusiform dilatation of the left common iliac artery measuring up to 2.2 cm.   Review of the MIP images confirms the above findings.   NON-VASCULAR   Hepatobiliary: No focal liver abnormality is seen. No gallstones, gallbladder wall thickening, or biliary dilatation.   Pancreas: Unremarkable. No pancreatic ductal dilatation or surrounding inflammatory changes.   Spleen: Normal in size without focal abnormality.   Adrenals/Urinary Tract: Adrenal glands are unremarkable. Kidneys are normal, without renal calculi, focal lesion, or hydronephrosis. Bladder is unremarkable.   Stomach/Bowel: Bowel shows no evidence of obstruction, ileus, inflammation or lesion. The appendix is normal. No free intraperitoneal air.   Lymphatic: No enlarged lymph nodes identified.   Reproductive: Mild prostate enlargement.   Other: Small right inguinal hernia containing fat. No ascites or abnormal fluid collections.  Musculoskeletal: No acute or significant osseous findings.   Review of the MIP images confirms the above findings.   IMPRESSION: 1. 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 Associated aneurysmal disease of the arch at this level measures 3.8 cm. The ulcer only measured approximately 1.6 cm in 2021 and the aorta at this level measured approximately 3.4 cm in 2022. 2. Contiguous aneurysmal disease of the distal arch and proximal descending thoracic aorta reaching maximum  diameter of approximately 5.3 cm which has not changed significantly since last August but shows clear gradual enlargement over time with the same segment measuring approximately 4.4 cm in 2021 and 4.7 cm in 2022. 3. 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 4. 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 This measured approximately 7 x 9 mm in 2021. 5. Gradual enlargement of the abdominal aorta with maximum dimensions just below the renal arteries of approximately 4.7 x 4.9 cm. At the same level, the aorta measures 4.3 x 4.3 cm in 2022 and 4.5 x 4.8 cm in 2024. 6. The distal abdominal aorta just above the aortic bifurcation is extremely tortuous and somewhat difficult to measure accurately. Estimated maximum caliber is 4.2 cm compared to 3.8 cm in 2022 and 4.2 cm in 2024. 7. 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 8. Stable fusiform dilatation of the left common iliac artery measuring up to 2.2 cm. 9. Emphysematous lung disease and bilateral mild pulmonary scarring.   Aortic aneurysm NOS (ICD10-I71.9).     Electronically Signed   By: Erica Hau M.D.   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  I have met with Mr. and Mrs. Loudenslager today.  I reviewed the CT images with them.  He was under the impression  that I said that everything was fine and then Dr. Charlotte Cookey was recommending surgery.  I clarified that he has significant aneurysmal disease in his thoracic aorta.  I do not think he needs any intervention in regards to that currently but does need continued monitoring.  However, I was very concerned about his abdominal and especially the iliac aneurysms, and that was why I made sure he had follow-up with vascular surgery.  I emphasized that I agreed with Dr. Mick Alamin recommendation to stent graft those aneurysms.  Also emphasized that we are not all clear with regards to his thoracic aneurysms but they did not need intervention currently.  He expresses understanding of the issues involved.  He has a follow-up appointment scheduled with Dr. Charlotte Cookey.  Plan: Follow-up with Dr. Charlotte Cookey as scheduled Return in about 6 months with CT to follow-up thoracic aneurysmal disease.  Can be done in conjunction with Dr. Mick Alamin follow-up.  Zelphia Higashi, MD [facility] [phone]

## 2023-07-06 ENCOUNTER — Other Ambulatory Visit: Payer: Self-pay | Admitting: Internal Medicine

## 2023-07-27 ENCOUNTER — Encounter: Payer: Self-pay | Admitting: Surgery

## 2023-07-27 ENCOUNTER — Ambulatory Visit: Attending: Surgery | Admitting: Surgery

## 2023-07-27 VITALS — BP 144/88 | HR 70 | Temp 97.9°F | Resp 20 | Ht 72.0 in | Wt 240.0 lb

## 2023-07-27 DIAGNOSIS — I7142 Juxtarenal abdominal aortic aneurysm, without rupture: Secondary | ICD-10-CM | POA: Insufficient documentation

## 2023-07-27 DIAGNOSIS — F0390 Unspecified dementia without behavioral disturbance: Secondary | ICD-10-CM

## 2023-07-27 HISTORY — DX: Unspecified dementia, unspecified severity, without behavioral disturbance, psychotic disturbance, mood disturbance, and anxiety: F03.90

## 2023-07-27 NOTE — H&P (View-Only) (Signed)
 [facility]  Patient name: Mark Hooper MRN: [id number] DOB: 03/08/1945 Sex: male   REASON FOR VISIT:    Follow up  HISOTRY OF PRESENT ILLNESS:   Mark Hooper is a 78 y.o. male who I have been asked to see by Dr. Christia Cowboy for his peri-aortic aneurysm.  The patient has been followed historically by Dr. Shirley Douglas as well as Dr. Luna Salinas for thoracic and aortic aneurysmal disease.  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  There is a proximal descending thoracic aorta now measuring 5.3 cm.  The infrarenal abdominal aorta measures 4.9 cm.  I had recommended repair with a TAMBE graft as well as an IBE, however the patient wanted to further discuss this with Dr. Luna Salinas.  He is now back for follow-up   Patient has a history of smoking.  He is on a statin for hypercholesterolemia.  He takes a baby aspirin .  His blood pressure is medically managed with an ARB.  He has developed progressively worsening dementia   PAST MEDICAL HISTORY:   Past Medical History:  Diagnosis Date   Abdominal aortic aneurysm (AAA) (HCC) 02/02/2018   Aneurysm, aortic (HCC)    Basal cell carcinoma 2016   Benign neoplasm of ascending colon    Benign neoplasm of descending colon    Benign neoplasm of sigmoid colon    CHF (congestive heart failure) (HCC)    Diverticulitis    DVT (deep venous thrombosis) (HCC)    Glaucoma    Hx of abdominal abscess 07/29/2013   2015 - diverticular abscess   Hypercholesteremia    Polyp of sigmoid colon    Rectal polyp    Thoracic aortic atherosclerosis (HCC) 02/02/2018   Wears dentures    partial upper     FAMILY HISTORY:   Family History  Problem Relation Age of Onset   Heart disease Father        CABG x 3   Hyperlipidemia Father    CAD Father    Prostate cancer Neg Hx    Bladder Cancer Neg Hx    Kidney cancer Neg Hx     SOCIAL HISTORY:   Social  History   Tobacco Use   Smoking status: Former    Current packs/day: 0.00    Average packs/day: 1 pack/day for 55.0 years (55.0 ttl pk-yrs)    Types: Cigarettes    Start date: 07/01/1965    Quit date: 07/01/2020    Years since quitting: 3.0   Smokeless tobacco: Never   Tobacco comments:    smoking cessation information given  Substance Use Topics   Alcohol use: Not Currently     ALLERGIES:   No Known Allergies   CURRENT MEDICATIONS:   Current Outpatient Medications  Medication Sig Dispense Refill   aspirin  EC 81 MG tablet Take 1 tablet (81 mg total) by mouth daily. Swallow whole.     atorvastatin  (LIPITOR) 40 MG tablet Take 1 tablet (40 mg total) by mouth daily. 90 tablet 2   donepezil (ARICEPT) 5 MG tablet Take 1 tablet by mouth at bedtime.     latanoprost (XALATAN) 0.005 % ophthalmic solution 1 drop at bedtime.     losartan  (COZAAR ) 25 MG tablet TAKE ONE-HALF (1/2) TABLET DAILY, PLEASE CONTACT OFFICE TO SCHEDULE FOLLOW UP PRIOR TO FURTHER REFILLS 45 tablet 1   No current facility-administered medications for this visit.    REVIEW OF SYSTEMS:   [X]   denotes positive finding, [ ]  denotes negative finding Cardiac  Comments:  Chest pain or chest pressure:    Shortness of breath upon exertion:    Short of breath when lying flat:    Irregular heart rhythm:        Vascular    Pain in calf, thigh, or hip brought on by ambulation:    Pain in feet at night that wakes you up from your sleep:     Blood clot in your veins:    Leg swelling:         Pulmonary    Oxygen at home:    Productive cough:     Wheezing:         Neurologic    Sudden weakness in arms or legs:     Sudden numbness in arms or legs:     Sudden onset of difficulty speaking or slurred speech:    Temporary loss of vision in one eye:     Problems with dizziness:         Gastrointestinal    Blood in stool:     Vomited blood:         Genitourinary    Burning when urinating:     Blood in urine:         Psychiatric    Major depression:         Hematologic    Bleeding problems:    Problems with blood clotting too easily:        Skin    Rashes or ulcers:        Constitutional    Fever or chills:      PHYSICAL EXAM:   Vitals:   07/27/23 1328  BP: (!) 144/88  Pulse: 70  Resp: 20  Temp: 97.9 F (36.6 C)  SpO2: 93%  Weight: 240 lb (108.9 kg)  Height: 6' (1.829 m)    GENERAL: The patient is a well-nourished male, in no acute distress. The vital signs are documented above. CARDIAC: There is a regular rate and rhythm.  PULMONARY: Non-labored respirations ABDOMEN: Soft and non-tender  MUSCULOSKELETAL: There are no major deformities or cyanosis. NEUROLOGIC: No focal weakness or paresthesias are detected. SKIN: There are no ulcers or rashes noted. PSYCHIATRIC: The patient has a normal affect.  STUDIES:   1. 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 Associated aneurysmal disease of the arch at this level measures 3.8 cm. The ulcer only measured approximately 1.6 cm in 2021 and the aorta at this level measured approximately 3.4 cm in 2022. 2. Contiguous aneurysmal disease of the distal arch and proximal descending thoracic aorta reaching maximum diameter of approximately 5.3 cm which has not changed significantly since last August but shows clear gradual enlargement over time with the same segment measuring approximately 4.4 cm in 2021 and 4.7 cm in 2022. 3. 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 4. 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 This measured approximately 7 x 9 mm in 2021. 5. Gradual enlargement  of the abdominal aorta with maximum dimensions just below the renal arteries of approximately 4.7 x 4.9 cm. At the same level, the aorta measures 4.3 x 4.3 cm in 2022 and  4.5 x 4.8 cm in 2024. 6. The distal abdominal aorta just above the aortic bifurcation is extremely tortuous and somewhat difficult to measure accurately. Estimated maximum caliber is 4.2 cm compared to 3.8 cm in 2022 and 4.2 cm in 2024. 7. 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 8. Stable fusiform dilatation of the left common iliac artery measuring up to 2.2 cm. 9. Emphysematous lung disease and bilateral mild pulmonary scarring.  MEDICAL ISSUES:   Aneurysmal disease: The patient has aneurysmal disease throughout his aorta extending into his iliacs.  His largest aneurysm is his right common iliac artery which measures 4.6 cm.  I think this is the most concerning area for rupture.  This can be treated with a iliac branch device.  In addition he has a 5 cm juxtarenal aorta and so I have recommended addressing this at the same time using a TAMBE device.  I had an extensive discussion with the patient and his wife about the risks, including but not limited to death cardiopulmonary complications, renal disease intestinal ischemia access issues stroke and paralysis.  They understand all these risks and wished to proceed.  I also discussed that we will not address the disease in his aortic arch and proximal descending thoracic aorta at this time, as that would place him at too high of a risk for paralysis.  He understands this will need to be followed and may need to be dealt with later.  Will work on scheduling and get him take care of in the near future.    Marti Slates, MD, FACS [facility] [phone] Pager [phone]

## 2023-07-27 NOTE — Progress Notes (Signed)
 [facility] [place]  Patient name: Mark Hooper MRN: [id number] DOB: 03/08/1945 Sex: male   REASON FOR VISIT:    Follow up  HISOTRY OF PRESENT ILLNESS:   Mark Hooper is a 78 y.o. male who I have been asked to see by Dr. Christia Cowboy for his peri-aortic aneurysm.  The patient has been followed historically by Dr. Shirley Douglas as well as Dr. Luna Salinas for thoracic and aortic aneurysmal disease.  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  There is a proximal descending thoracic aorta now measuring 5.3 cm.  The infrarenal abdominal aorta measures 4.9 cm.  I had recommended repair with a TAMBE graft as well as an IBE, however the patient wanted to further discuss this with Dr. Luna Salinas.  He is now back for follow-up   Patient has a history of smoking.  He is on a statin for hypercholesterolemia.  He takes a baby aspirin .  His blood pressure is medically managed with an ARB.  He has developed progressively worsening dementia   PAST MEDICAL HISTORY:   Past Medical History:  Diagnosis Date   Abdominal aortic aneurysm (AAA) (HCC) 02/02/2018   Aneurysm, aortic (HCC)    Basal cell carcinoma 2016   Benign neoplasm of ascending colon    Benign neoplasm of descending colon    Benign neoplasm of sigmoid colon    CHF (congestive heart failure) (HCC)    Diverticulitis    DVT (deep venous thrombosis) (HCC)    Glaucoma    Hx of abdominal abscess 07/29/2013   2015 - diverticular abscess   Hypercholesteremia    Polyp of sigmoid colon    Rectal polyp    Thoracic aortic atherosclerosis (HCC) 02/02/2018   Wears dentures    partial upper     FAMILY HISTORY:   Family History  Problem Relation Age of Onset   Heart disease Father        CABG x 3   Hyperlipidemia Father    CAD Father    Prostate cancer Neg Hx    Bladder Cancer Neg Hx    Kidney cancer Neg Hx     SOCIAL HISTORY:   Social  History   Tobacco Use   Smoking status: Former    Current packs/day: 0.00    Average packs/day: 1 pack/day for 55.0 years (55.0 ttl pk-yrs)    Types: Cigarettes    Start date: 07/01/1965    Quit date: 07/01/2020    Years since quitting: 3.0   Smokeless tobacco: Never   Tobacco comments:    smoking cessation information given  Substance Use Topics   Alcohol use: Not Currently     ALLERGIES:   No Known Allergies   CURRENT MEDICATIONS:   Current Outpatient Medications  Medication Sig Dispense Refill   aspirin  EC 81 MG tablet Take 1 tablet (81 mg total) by mouth daily. Swallow whole.     atorvastatin  (LIPITOR) 40 MG tablet Take 1 tablet (40 mg total) by mouth daily. 90 tablet 2   donepezil (ARICEPT) 5 MG tablet Take 1 tablet by mouth at bedtime.     latanoprost (XALATAN) 0.005 % ophthalmic solution 1 drop at bedtime.     losartan  (COZAAR ) 25 MG tablet TAKE ONE-HALF (1/2) TABLET DAILY, PLEASE CONTACT OFFICE TO SCHEDULE FOLLOW UP PRIOR TO FURTHER REFILLS 45 tablet 1   No current facility-administered medications for this visit.    REVIEW OF SYSTEMS:   [X]   denotes positive finding, [ ]  denotes negative finding Cardiac  Comments:  Chest pain or chest pressure:    Shortness of breath upon exertion:    Short of breath when lying flat:    Irregular heart rhythm:        Vascular    Pain in calf, thigh, or hip brought on by ambulation:    Pain in feet at night that wakes you up from your sleep:     Blood clot in your veins:    Leg swelling:         Pulmonary    Oxygen at home:    Productive cough:     Wheezing:         Neurologic    Sudden weakness in arms or legs:     Sudden numbness in arms or legs:     Sudden onset of difficulty speaking or slurred speech:    Temporary loss of vision in one eye:     Problems with dizziness:         Gastrointestinal    Blood in stool:     Vomited blood:         Genitourinary    Burning when urinating:     Blood in urine:         Psychiatric    Major depression:         Hematologic    Bleeding problems:    Problems with blood clotting too easily:        Skin    Rashes or ulcers:        Constitutional    Fever or chills:      PHYSICAL EXAM:   Vitals:   07/27/23 1328  BP: (!) 144/88  Pulse: 70  Resp: 20  Temp: 97.9 F (36.6 C)  SpO2: 93%  Weight: 240 lb (108.9 kg)  Height: 6' (1.829 m)    GENERAL: The patient is a well-nourished male, in no acute distress. The vital signs are documented above. CARDIAC: There is a regular rate and rhythm.  PULMONARY: Non-labored respirations ABDOMEN: Soft and non-tender  MUSCULOSKELETAL: There are no major deformities or cyanosis. NEUROLOGIC: No focal weakness or paresthesias are detected. SKIN: There are no ulcers or rashes noted. PSYCHIATRIC: The patient has a normal affect.  STUDIES:   1. 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 Associated aneurysmal disease of the arch at this level measures 3.8 cm. The ulcer only measured approximately 1.6 cm in 2021 and the aorta at this level measured approximately 3.4 cm in 2022. 2. Contiguous aneurysmal disease of the distal arch and proximal descending thoracic aorta reaching maximum diameter of approximately 5.3 cm which has not changed significantly since last August but shows clear gradual enlargement over time with the same segment measuring approximately 4.4 cm in 2021 and 4.7 cm in 2022. 3. 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 4. 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 This measured approximately 7 x 9 mm in 2021. 5. Gradual enlargement  of the abdominal aorta with maximum dimensions just below the renal arteries of approximately 4.7 x 4.9 cm. At the same level, the aorta measures 4.3 x 4.3 cm in 2022 and  4.5 x 4.8 cm in 2024. 6. The distal abdominal aorta just above the aortic bifurcation is extremely tortuous and somewhat difficult to measure accurately. Estimated maximum caliber is 4.2 cm compared to 3.8 cm in 2022 and 4.2 cm in 2024. 7. 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 8. Stable fusiform dilatation of the left common iliac artery measuring up to 2.2 cm. 9. Emphysematous lung disease and bilateral mild pulmonary scarring.  MEDICAL ISSUES:   Aneurysmal disease: The patient has aneurysmal disease throughout his aorta extending into his iliacs.  His largest aneurysm is his right common iliac artery which measures 4.6 cm.  I think this is the most concerning area for rupture.  This can be treated with a iliac branch device.  In addition he has a 5 cm juxtarenal aorta and so I have recommended addressing this at the same time using a TAMBE device.  I had an extensive discussion with the patient and his wife about the risks, including but not limited to death cardiopulmonary complications, renal disease intestinal ischemia access issues stroke and paralysis.  They understand all these risks and wished to proceed.  I also discussed that we will not address the disease in his aortic arch and proximal descending thoracic aorta at this time, as that would place him at too high of a risk for paralysis.  He understands this will need to be followed and may need to be dealt with later.  Will work on scheduling and get him take care of in the near future.    Marti Slates, MD, FACS [facility] [phone] Pager [phone]

## 2023-07-30 ENCOUNTER — Other Ambulatory Visit: Payer: Self-pay

## 2023-07-30 DIAGNOSIS — I7142 Juxtarenal abdominal aortic aneurysm, without rupture: Secondary | ICD-10-CM

## 2023-07-30 DIAGNOSIS — I998 Other disorder of circulatory system: Secondary | ICD-10-CM

## 2023-08-25 ENCOUNTER — Other Ambulatory Visit: Payer: Self-pay

## 2023-08-25 ENCOUNTER — Encounter (HOSPITAL_COMMUNITY): Payer: Self-pay | Admitting: Vascular Surgery

## 2023-08-25 DIAGNOSIS — I7142 Juxtarenal abdominal aortic aneurysm, without rupture: Secondary | ICD-10-CM

## 2023-08-25 NOTE — Anesthesia Preprocedure Evaluation (Signed)
 Anesthesia Evaluation  Patient identified by MRN, date of birth, ID band Patient awake    Reviewed: Allergy & Precautions, NPO status , Patient's Chart, lab work & pertinent test results  Airway Mallampati: II  TM Distance: >3 FB Neck ROM: Full    Dental  (+) Teeth Intact, Dental Advisory Given   Pulmonary COPD, Patient abstained from smoking., former smoker   breath sounds clear to auscultation       Cardiovascular hypertension, + CAD and +CHF   Rhythm:Regular Rate:Normal  Echo:   1. Left ventricular ejection fraction, by estimation, is 60 to 65%. The  left ventricle has normal function. The left ventricle has no regional  wall motion abnormalities. Left ventricular diastolic parameters are  consistent with Grade I diastolic  dysfunction (impaired relaxation).   2. Right ventricular systolic function is normal. The right ventricular  size is normal. Tricuspid regurgitation signal is inadequate for assessing  PA pressure.   3. The mitral valve is normal in structure. No evidence of mitral valve  regurgitation. No evidence of mitral stenosis.   4. The aortic valve was not well visualized. Aortic valve regurgitation  is not visualized. Aortic valve sclerosis/calcification is present,  without any evidence of aortic stenosis.   5. There is borderline dilatation of the aortic root and of the ascending  aorta, measuring 37 mm.   6. The inferior vena cava is normal in size with greater than 50%  respiratory variability, suggesting right atrial pressure of 3 mmHg.   7. Challenging images, definity  used.     Neuro/Psych  PSYCHIATRIC DISORDERS     Dementia    GI/Hepatic negative GI ROS, Neg liver ROS,,,  Endo/Other  negative endocrine ROS    Renal/GU negative Renal ROS     Musculoskeletal negative musculoskeletal ROS (+)    Abdominal   Peds  Hematology negative hematology ROS (+)   Anesthesia Other Findings    Reproductive/Obstetrics                              Anesthesia Physical Anesthesia Plan  ASA: 4  Anesthesia Plan: General   Post-op Pain Management: Tylenol  PO (pre-op)*   Induction: Intravenous  PONV Risk Score and Plan: 3 and Ondansetron , Dexamethasone  and Treatment may vary due to age or medical condition  Airway Management Planned: Oral ETT  Additional Equipment: Arterial line  Intra-op Plan:   Post-operative Plan: Extubation in OR  Informed Consent: I have reviewed the patients History and Physical, chart, labs and discussed the procedure including the risks, benefits and alternatives for the proposed anesthesia with the patient or authorized representative who has indicated his/her understanding and acceptance.     Dental advisory given  Plan Discussed with: CRNA  Anesthesia Plan Comments: (PAT note written 08/25/2023 by Brevyn Ring, PA-C.  )         Anesthesia Quick Evaluation

## 2023-08-25 NOTE — Progress Notes (Signed)
 Anesthesia Chart Review: SAME DAY WORK-UP  Case: [id number] Date/Time: 08/26/23 0830   Procedure: THORACO-ABOMINAL ANEURYSM REPAIR - A-Line - Right Arm   Anesthesia type: General   Pre-op diagnosis: juxtarenal abdominal aortic anyuresym   Location: [facility] (SURGICAL) / [facility]   Providers: Lanis Fonda BRAVO, MD       DISCUSSION: Patient is a 78 year old male scheduled for the above procedure.   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   He is followed by CT and vascular surgery for thoracic and aortic aneurysmal disease. Vascular surgeon recently recommended a TAMBE graft as well as an IBE. In May, CT surgery recommended six month follow-up for thoracic disease.   He is a same day work-up. Labs and anesthesia team evaluation on the day of surgery.    VS: Ht 6' (1.829 m)   Wt 108.9 kg   BMI 32.55 kg/m  BP Readings from Last 3 Encounters:  07/27/23 (!) 144/88  06/23/23 (!) 163/90  05/18/23 132/85   Pulse Readings from Last 3 Encounters:  07/27/23 70  06/23/23 64  05/18/23 73    PROVIDERS: Justus Leita DEL, MD is PCP  Mady Bruckner, MD is cardiologist. Last visit 10/24/22 with Abigail Motto, PA-C. He was going well from a cardiac standpoint. Playing golf 5 days/week. 12 month follow-up planned. Kerrin Standing, MD is CT surgeon (thoracic aortic aneurysms). At 06/23/23 visit advised he get re-evaluated by vascular surgery and advised and would need to be seen at CT and/or vascular surgery at least every six months.  Lanis Fonda, MD is vascular surgeon  Lane Hussar, MD is neurologist   LABS: No recent labs noted. For labs on arrival day of surgery.   IMAGES: CTA Chest/abd/pelvis  04/07/23: IMPRESSION: 1. 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 Associated aneurysmal disease of the arch at this level measures 3.8 cm. The ulcer only measured approximately 1.6 cm in 2021 and the aorta at this level measured approximately 3.4 cm in 2022. 2. Contiguous aneurysmal disease of the distal arch and proximal descending thoracic aorta reaching maximum diameter of approximately 5.3 cm which has not changed significantly since last August but shows clear gradual enlargement over time with the same segment measuring approximately 4.4 cm in 2021 and 4.7 cm in 2022. 3. 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 4. 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 This measured approximately 7 x 9 mm in 2021. 5. Gradual enlargement of the abdominal aorta with maximum dimensions just below the renal arteries of approximately 4.7 x 4.9 cm. At the same level, the aorta measures 4.3 x 4.3 cm in 2022 and 4.5 x 4.8 cm in 2024. 6. The distal abdominal aorta just above the aortic bifurcation is extremely tortuous and somewhat difficult to measure accurately. Estimated maximum caliber is 4.2 cm compared to 3.8 cm in 2022 and 4.2 cm in 2024. 7. Aneurysmal disease of the right common iliac artery measures up to 4.6  cm and shows definite enlargement over time measuring approximately 3.7 cm in 2022 and 4.1 cm in 2024. 8. Stable fusiform dilatation of the left common iliac artery measuring up to 2.2 cm. 9. Emphysematous lung disease and bilateral mild pulmonary scarring. - Aortic aneurysm NOS (ICD10-I71.9).    EKG: 10/24/22:  NSR   CV: TTE 02/07/21: IMPRESSIONS   1. Left ventricular ejection fraction, by estimation, is 60 to 65%. The  left ventricle has normal function. The left ventricle has no regional  wall motion abnormalities. Left ventricular diastolic parameters are  consistent with Grade I diastolic  dysfunction (impaired relaxation).   2. Right ventricular systolic function is normal. The right ventricular  size is normal. Tricuspid regurgitation signal is inadequate for assessing  PA pressure.   3. The mitral valve is normal in structure. No evidence of mitral valve  regurgitation. No evidence of mitral stenosis.   4. The aortic valve was not well visualized. Aortic valve regurgitation  is not visualized. Aortic valve sclerosis/calcification is present,  without any evidence of aortic stenosis.   5. There is borderline dilatation of the aortic root and of the ascending  aorta, measuring 37 mm.   6. The inferior vena cava is normal in size with greater than 50%  respiratory variability, suggesting right atrial pressure of 3 mmHg.   7. Challenging images, definity  used.  - Comparison(s): LVEF 60-65%.    CTA Coronary 01/17/21: IMPRESSION: 1. Coronary calcium  score of 391. This was 61st percentile for age and sex matched control. 2. Normal coronary origin with right dominance. 3. Calcified plaque causing mild stenosis in the LAD and RCA. 4. CAD-RADS 2. Mild non-obstructive CAD (25-49%). Consider non-atherosclerotic causes of chest pain. Consider preventive therapy and risk factor modification.   US  Carotid 01/02/21: IMPRESSION: 1. Right carotid artery system: Less than 50% stenosis secondary to mild scattered atherosclerotic plaque formation. 2. Left carotid artery system: Less than 50% stenosis secondary to mild scattered atherosclerotic plaque formation. 3.  Vertebral artery system: Patent with antegrade flow bilaterally.  Past Medical History:  Diagnosis Date   Abdominal aortic  aneurysm (AAA) (HCC) 02/02/2018   Aneurysm, aortic (HCC)    Basal cell carcinoma 2016   Benign neoplasm of ascending colon    Benign neoplasm of descending colon    Benign neoplasm of sigmoid colon    CHF (congestive heart failure) (HCC)    Dementia (HCC) 07/27/2023   Diverticulitis    DVT (deep venous thrombosis) (HCC)    Glaucoma    Hx of abdominal abscess 07/29/2013   2015 - diverticular abscess   Hypercholesteremia    Polyp of sigmoid colon    Rectal polyp    Thoracic aortic atherosclerosis (HCC) 02/02/2018   Wears dentures    partial upper    Past Surgical History:  Procedure Laterality Date   basal cell cancer excision     CATARACT EXTRACTION     COLONOSCOPY     COLONOSCOPY WITH PROPOFOL  N/A 10/27/2014   Procedure: COLONOSCOPY WITH PROPOFOL ;  Surgeon: Rogelia Copping, MD;  Location: [facility];  Service: Endoscopy;  Laterality: N/A;   COLONOSCOPY WITH PROPOFOL  N/A 11/25/2019   Procedure: COLONOSCOPY WITH PROPOFOL ;  Surgeon: Copping Rogelia, MD;  Location: [facility];  Service: Endoscopy;  Laterality: N/A;  priority 4   MOHS SURGERY  02/04/2005   in [place]   POLYPECTOMY  10/27/2014   Procedure: POLYPECTOMY;  Surgeon: Rogelia Copping, MD;  Location: [facility];  Service: Endoscopy;;   POLYPECTOMY  11/25/2019   Procedure: POLYPECTOMY;  Surgeon: Jinny Carmine, MD;  Location: [facility];  Service: Endoscopy;;    MEDICATIONS: No current facility-administered medications for this encounter.    aspirin  EC 81 MG tablet   atorvastatin  (LIPITOR) 40 MG tablet   donepezil  (ARICEPT ) 10 MG tablet   latanoprost (XALATAN) 0.005 % ophthalmic solution   losartan  (COZAAR ) 25 MG tablet    Isaiah Ruder, PA-C Surgical Short Stay/Anesthesiology [facility] Phone [phone] [facility] Phone [phone] 08/25/2023 4:49 PM

## 2023-08-25 NOTE — Progress Notes (Signed)
 PCP - Dr Leita Adie Cardiologist - Dr Lonni End Cardiothoracic Surgeon - Dr Marcey Millers Neurology - Dr Arthea Farrow  CT Chest x-ray - 04/07/23 EKG - DOS Stress Test - n/a ECHO - 02/07/21 Cardiac Cath - n/a  ICD Pacemaker/Loop - n/a  Sleep Study -  n/a  Diabetes - n/a  Blood Thinner Instructions:  n/a  Aspirin  Instructions: Continue per MD's instructions.  NPO  Anesthesia review: Yes  STOP now taking any Aspirin  (unless otherwise instructed by your surgeon), Aleve, Naproxen, Ibuprofen, Motrin, Advil, Goody's, BC's, all herbal medications, fish oil, and all vitamins.   Coronavirus Screening Do you have any of the following symptoms:  Cough yes/no: No Fever (>100.70F)  yes/no: No Runny nose yes/no: No Sore throat yes/no: No Difficulty breathing/shortness of breath  yes/no: No  Have you traveled in the last 14 days and where? yes/no: No  Patient verbalized understanding of instructions that were given via phone.

## 2023-08-26 ENCOUNTER — Encounter (HOSPITAL_COMMUNITY)
Admission: RE | Disposition: A | Discharge status: Home / Self Care | Payer: Self-pay | Source: Home / Self Care | Attending: Surgery

## 2023-08-26 ENCOUNTER — Encounter (HOSPITAL_COMMUNITY): Payer: Self-pay | Admitting: Vascular Surgery

## 2023-08-26 ENCOUNTER — Inpatient Hospital Stay (HOSPITAL_COMMUNITY): Payer: Self-pay | Admitting: Certified Registered Nurse Anesthetist

## 2023-08-26 ENCOUNTER — Inpatient Hospital Stay (HOSPITAL_COMMUNITY)
Admission: RE | Admit: 2023-08-26 | Discharge: 2023-08-30 | DRG: 220 | Disposition: A | Discharge status: Home / Self Care | Attending: Vascular Surgery | Admitting: Vascular Surgery

## 2023-08-26 ENCOUNTER — Other Ambulatory Visit: Payer: Self-pay

## 2023-08-26 DIAGNOSIS — E7800 Pure hypercholesterolemia, unspecified: Secondary | ICD-10-CM | POA: Diagnosis not present

## 2023-08-26 DIAGNOSIS — Z85828 Personal history of other malignant neoplasm of skin: Secondary | ICD-10-CM | POA: Diagnosis not present

## 2023-08-26 DIAGNOSIS — I7123 Aneurysm of the descending thoracic aorta, without rupture: Principal | ICD-10-CM | POA: Diagnosis present

## 2023-08-26 DIAGNOSIS — I716 Thoracoabdominal aortic aneurysm, without rupture, unspecified: Secondary | ICD-10-CM

## 2023-08-26 DIAGNOSIS — Z86718 Personal history of other venous thrombosis and embolism: Secondary | ICD-10-CM

## 2023-08-26 DIAGNOSIS — D62 Acute posthemorrhagic anemia: Secondary | ICD-10-CM | POA: Diagnosis not present

## 2023-08-26 DIAGNOSIS — I7143 Infrarenal abdominal aortic aneurysm, without rupture: Secondary | ICD-10-CM | POA: Diagnosis present

## 2023-08-26 DIAGNOSIS — Z87891 Personal history of nicotine dependence: Secondary | ICD-10-CM | POA: Diagnosis not present

## 2023-08-26 DIAGNOSIS — F0390 Unspecified dementia without behavioral disturbance: Secondary | ICD-10-CM | POA: Diagnosis present

## 2023-08-26 DIAGNOSIS — I712 Thoracic aortic aneurysm, without rupture, unspecified: Secondary | ICD-10-CM | POA: Diagnosis not present

## 2023-08-26 DIAGNOSIS — Z860100 Personal history of colon polyps, unspecified: Secondary | ICD-10-CM

## 2023-08-26 DIAGNOSIS — I973 Postprocedural hypertension: Secondary | ICD-10-CM | POA: Diagnosis not present

## 2023-08-26 DIAGNOSIS — I2510 Atherosclerotic heart disease of native coronary artery without angina pectoris: Secondary | ICD-10-CM

## 2023-08-26 DIAGNOSIS — I10 Essential (primary) hypertension: Secondary | ICD-10-CM | POA: Diagnosis present

## 2023-08-26 DIAGNOSIS — I110 Hypertensive heart disease with heart failure: Secondary | ICD-10-CM | POA: Diagnosis not present

## 2023-08-26 DIAGNOSIS — Z8249 Family history of ischemic heart disease and other diseases of the circulatory system: Secondary | ICD-10-CM

## 2023-08-26 DIAGNOSIS — I509 Heart failure, unspecified: Secondary | ICD-10-CM | POA: Diagnosis not present

## 2023-08-26 DIAGNOSIS — I700 Atherosclerosis of aorta: Secondary | ICD-10-CM | POA: Diagnosis present

## 2023-08-26 DIAGNOSIS — Z7982 Long term (current) use of aspirin: Secondary | ICD-10-CM | POA: Diagnosis not present

## 2023-08-26 DIAGNOSIS — J449 Chronic obstructive pulmonary disease, unspecified: Secondary | ICD-10-CM | POA: Diagnosis present

## 2023-08-26 DIAGNOSIS — R319 Hematuria, unspecified: Secondary | ICD-10-CM | POA: Diagnosis not present

## 2023-08-26 DIAGNOSIS — I723 Aneurysm of iliac artery: Secondary | ICD-10-CM | POA: Diagnosis not present

## 2023-08-26 DIAGNOSIS — I998 Other disorder of circulatory system: Secondary | ICD-10-CM

## 2023-08-26 DIAGNOSIS — Z83438 Family history of other disorder of lipoprotein metabolism and other lipidemia: Secondary | ICD-10-CM

## 2023-08-26 DIAGNOSIS — I7142 Juxtarenal abdominal aortic aneurysm, without rupture: Secondary | ICD-10-CM

## 2023-08-26 DIAGNOSIS — I714 Abdominal aortic aneurysm, without rupture, unspecified: Secondary | ICD-10-CM | POA: Diagnosis present

## 2023-08-26 DIAGNOSIS — Z79899 Other long term (current) drug therapy: Secondary | ICD-10-CM | POA: Diagnosis not present

## 2023-08-26 HISTORY — PX: ABDOMINAL AORTOGRAM: CATH118222

## 2023-08-26 HISTORY — DX: Atherosclerotic heart disease of native coronary artery without angina pectoris: I25.10

## 2023-08-26 HISTORY — PX: LOWER EXTREMITY INTERVENTION: CATH118252

## 2023-08-26 HISTORY — PX: THORACO-ABOMINAL ANEURYSM REPAIR: CATH118362

## 2023-08-26 HISTORY — PX: VASCULAR CUTDOWN: CATH118369

## 2023-08-26 LAB — PREPARE RBC (CROSSMATCH)

## 2023-08-26 LAB — COMPREHENSIVE METABOLIC PANEL WITH GFR
ALT: 20 U/L (ref 0–44)
AST: 18 U/L (ref 15–41)
Albumin: 3.7 g/dL (ref 3.5–5.0)
Alkaline Phosphatase: 73 U/L (ref 38–126)
Anion gap: 9 (ref 5–15)
BUN: 15 mg/dL (ref 8–23)
CO2: 26 mmol/L (ref 22–32)
Calcium: 10.6 mg/dL — ABNORMAL HIGH (ref 8.9–10.3)
Chloride: 105 mmol/L (ref 98–111)
Creatinine, Ser: 1.1 mg/dL (ref 0.61–1.24)
GFR, Estimated: >60 mL/min (ref >60)
Glucose, Bld: 122 mg/dL — ABNORMAL HIGH (ref 70–99)
Potassium: 4.9 mmol/L (ref 3.5–5.1)
Sodium: 140 mmol/L (ref 135–145)
Total Bilirubin: 0.7 mg/dL (ref 0.0–1.2)
Total Protein: 7.2 g/dL (ref 6.5–8.1)

## 2023-08-26 LAB — POCT I-STAT 7, (LYTES, BLD GAS, ICA,H+H)
Acid-base deficit: 3 mmol/L — ABNORMAL HIGH (ref 0.0–2.0)
Acid-base deficit: 4 mmol/L — ABNORMAL HIGH (ref 0.0–2.0)
Bicarbonate: 22.9 mmol/L (ref 20.0–28.0)
Bicarbonate: 22.9 mmol/L (ref 20.0–28.0)
Calcium, Ion: 1.37 mmol/L (ref 1.15–1.40)
Calcium, Ion: 1.33 mmol/L (ref 1.15–1.40)
HCT: 43 % (ref 39.0–52.0)
HCT: 38 % — ABNORMAL LOW (ref 39.0–52.0)
Hemoglobin: 14.6 g/dL (ref 13.0–17.0)
Hemoglobin: 12.9 g/dL — ABNORMAL LOW (ref 13.0–17.0)
O2 Saturation: 98 %
O2 Saturation: 95 %
Patient temperature: 36.1
Potassium: 5.1 mmol/L (ref 3.5–5.1)
Potassium: 4.6 mmol/L (ref 3.5–5.1)
Sodium: 139 mmol/L (ref 135–145)
Sodium: 139 mmol/L (ref 135–145)
TCO2: 24 mmol/L (ref 22–32)
TCO2: 24 mmol/L (ref 22–32)
pCO2 arterial: 42.8 mmHg (ref 32–48)
pCO2 arterial: 47.8 mmHg (ref 32–48)
pH, Arterial: 7.332 — ABNORMAL LOW (ref 7.35–7.45)
pH, Arterial: 7.289 — ABNORMAL LOW (ref 7.35–7.45)
pO2, Arterial: 106 mmHg (ref 83–108)
pO2, Arterial: 86 mmHg (ref 83–108)

## 2023-08-26 LAB — CBC
HCT: 50.4 % (ref 39.0–52.0)
HCT: 41.6 % (ref 39.0–52.0)
Hemoglobin: 16.2 g/dL (ref 13.0–17.0)
Hemoglobin: 13.8 g/dL (ref 13.0–17.0)
MCH: 31 pg (ref 26.0–34.0)
MCH: 31.4 pg (ref 26.0–34.0)
MCHC: 32.1 g/dL (ref 30.0–36.0)
MCHC: 33.2 g/dL (ref 30.0–36.0)
MCV: 96.4 fL (ref 80.0–100.0)
MCV: 94.8 fL (ref 80.0–100.0)
Platelets: 280 K/uL (ref 150–400)
Platelets: 208 K/uL (ref 150–400)
RBC: 5.23 MIL/uL (ref 4.22–5.81)
RBC: 4.39 MIL/uL (ref 4.22–5.81)
RDW: 13.5 % (ref 11.5–15.5)
RDW: 13.5 % (ref 11.5–15.5)
WBC: 8.9 K/uL (ref 4.0–10.5)
WBC: 12.4 K/uL — ABNORMAL HIGH (ref 4.0–10.5)
nRBC: 0 % (ref 0.0–0.2)
nRBC: 0 % (ref 0.0–0.2)

## 2023-08-26 LAB — POCT ACTIVATED CLOTTING TIME
Activated Clotting Time: 245 s
Activated Clotting Time: 222 s
Activated Clotting Time: 239 s
Activated Clotting Time: 251 s
Activated Clotting Time: 228 s
Activated Clotting Time: 228 s
Activated Clotting Time: 233 s

## 2023-08-26 LAB — PROTIME-INR
INR: 1 (ref 0.8–1.2)
Prothrombin Time: 13.4 s (ref 11.4–15.2)

## 2023-08-26 LAB — SURGICAL PCR SCREEN
MRSA, PCR: NEGATIVE
Staphylococcus aureus: NEGATIVE

## 2023-08-26 LAB — MAGNESIUM: Magnesium: 2 mg/dL (ref 1.7–2.4)

## 2023-08-26 LAB — APTT
aPTT: 30 s (ref 24–36)
aPTT: 32 s (ref 24–36)

## 2023-08-26 LAB — BASIC METABOLIC PANEL WITH GFR
Anion gap: 13 (ref 5–15)
BUN: 13 mg/dL (ref 8–23)
CO2: 24 mmol/L (ref 22–32)
Calcium: 9.8 mg/dL (ref 8.9–10.3)
Chloride: 103 mmol/L (ref 98–111)
Creatinine, Ser: 1.1 mg/dL (ref 0.61–1.24)
GFR, Estimated: >60 mL/min (ref >60)
Glucose, Bld: 173 mg/dL — ABNORMAL HIGH (ref 70–99)
Potassium: 4.1 mmol/L (ref 3.5–5.1)
Sodium: 140 mmol/L (ref 135–145)

## 2023-08-26 LAB — ABO/RH: ABO/RH(D): A POS

## 2023-08-26 SURGERY — THORACO-ABDOMINAL ANEURYSM  REPAIR (CATHLAB)
Anesthesia: General

## 2023-08-26 MED ORDER — ATORVASTATIN CALCIUM 40 MG PO TABS
40.0000 mg | ORAL_TABLET | Freq: Every day | ORAL | Status: DC
  Administered 2023-08-26 – 2023-08-29 (×4): 40 mg via ORAL
  Filled 2023-08-26 (×4): qty 1

## 2023-08-26 MED ORDER — LABETALOL HCL 5 MG/ML IV SOLN
0.5000 mg/min | Status: DC
  Administered 2023-08-26: 0.5 mg/min via INTRAVENOUS
  Filled 2023-08-26: qty 80

## 2023-08-26 MED ORDER — LACTATED RINGERS IV SOLN: INTRAVENOUS | Status: DC | PRN

## 2023-08-26 MED ORDER — POLYETHYLENE GLYCOL 3350 17 G PO PACK: 17.0000 g | PACK | Freq: Every day | ORAL | Status: DC | PRN

## 2023-08-26 MED ORDER — SODIUM BICARBONATE 8.4 % IV SOLN
INTRAVENOUS | Status: DC | PRN
  Administered 2023-08-26: 50 meq via INTRAVENOUS

## 2023-08-26 MED ORDER — LIDOCAINE 2% (20 MG/ML) 5 ML SYRINGE
INTRAMUSCULAR | Status: DC | PRN
  Administered 2023-08-26: 40 mg via INTRAVENOUS

## 2023-08-26 MED ORDER — CHLORHEXIDINE GLUCONATE CLOTH 2 % EX PADS: 6.0000 | MEDICATED_PAD | Freq: Once | CUTANEOUS | Status: DC

## 2023-08-26 MED ORDER — ASPIRIN 81 MG PO TBEC
81.0000 mg | DELAYED_RELEASE_TABLET | Freq: Every day | ORAL | Status: DC
  Administered 2023-08-27 – 2023-08-30 (×4): 81 mg via ORAL
  Filled 2023-08-26 (×4): qty 1

## 2023-08-26 MED ORDER — CLEVIDIPINE BUTYRATE 0.5 MG/ML IV EMUL
INTRAVENOUS | Status: DC | PRN
Start: 2023-08-26 — End: 2023-08-26
  Administered 2023-08-26: 1 mg/h via INTRAVENOUS

## 2023-08-26 MED ORDER — HYDRALAZINE HCL 20 MG/ML IJ SOLN: 5.0000 mg | INTRAMUSCULAR | Status: DC | PRN

## 2023-08-26 MED ORDER — DONEPEZIL HCL 10 MG PO TABS
10.0000 mg | ORAL_TABLET | Freq: Every day | ORAL | Status: DC
  Administered 2023-08-26 – 2023-08-29 (×4): 10 mg via ORAL
  Filled 2023-08-26 (×4): qty 1

## 2023-08-26 MED ORDER — HEPARIN SODIUM (PORCINE) 5000 UNIT/ML IJ SOLN
5000.0000 [IU] | Freq: Two times a day (BID) | INTRAMUSCULAR | Status: DC
  Administered 2023-08-27 – 2023-08-30 (×7): 5000 [IU] via SUBCUTANEOUS
  Filled 2023-08-26 (×7): qty 1

## 2023-08-26 MED ORDER — METOPROLOL TARTRATE 5 MG/5ML IV SOLN: 2.5000 mg | INTRAVENOUS | Status: DC | PRN

## 2023-08-26 MED ORDER — PHENOL 1.4 % MT LIQD: 1.0000 | OROMUCOSAL | Status: DC | PRN

## 2023-08-26 MED ORDER — POTASSIUM CHLORIDE CRYS ER 20 MEQ PO TBCR
40.0000 meq | EXTENDED_RELEASE_TABLET | Freq: Every day | ORAL | Status: DC | PRN
  Administered 2023-08-29: 40 meq via ORAL
  Filled 2023-08-26: qty 2

## 2023-08-26 MED ORDER — SODIUM CHLORIDE 0.9% IV SOLUTION: Freq: Once | INTRAVENOUS | Status: DC

## 2023-08-26 MED ORDER — PROPOFOL 10 MG/ML IV BOLUS
INTRAVENOUS | Status: DC | PRN
  Administered 2023-08-26: 150 mg via INTRAVENOUS

## 2023-08-26 MED ORDER — IODIXANOL 320 MG/ML IV SOLN
INTRAVENOUS | Status: DC | PRN
  Administered 2023-08-26: 180 mL

## 2023-08-26 MED ORDER — OXYCODONE HCL 5 MG PO TABS
5.0000 mg | ORAL_TABLET | ORAL | Status: DC | PRN
  Administered 2023-08-28: 5 mg via ORAL
  Filled 2023-08-26: qty 1

## 2023-08-26 MED ORDER — LABETALOL HCL 5 MG/ML IV SOLN: 10.0000 mg | INTRAVENOUS | Status: DC | PRN

## 2023-08-26 MED ORDER — PANTOPRAZOLE SODIUM 40 MG PO TBEC
40.0000 mg | DELAYED_RELEASE_TABLET | Freq: Every day | ORAL | Status: DC
  Administered 2023-08-27 – 2023-08-30 (×4): 40 mg via ORAL
  Filled 2023-08-26 (×4): qty 1

## 2023-08-26 MED ORDER — CHLORHEXIDINE GLUCONATE 0.12 % MT SOLN
15.0000 mL | Freq: Once | OROMUCOSAL | Status: AC
  Administered 2023-08-26: 15 mL via OROMUCOSAL
  Filled 2023-08-26: qty 15

## 2023-08-26 MED ORDER — PHENYLEPHRINE 80 MCG/ML (10ML) SYRINGE FOR IV PUSH (FOR BLOOD PRESSURE SUPPORT)
PREFILLED_SYRINGE | INTRAVENOUS | Status: DC | PRN
  Administered 2023-08-26 (×2): 80 ug via INTRAVENOUS

## 2023-08-26 MED ORDER — DOCUSATE SODIUM 100 MG PO CAPS
100.0000 mg | ORAL_CAPSULE | Freq: Every day | ORAL | Status: DC
  Administered 2023-08-27 – 2023-08-30 (×4): 100 mg via ORAL
  Filled 2023-08-26 (×4): qty 1

## 2023-08-26 MED ORDER — PHENYLEPHRINE HCL-NACL 20-0.9 MG/250ML-% IV SOLN
INTRAVENOUS | Status: DC | PRN
  Administered 2023-08-26: 20 ug/min via INTRAVENOUS

## 2023-08-26 MED ORDER — CEFAZOLIN SODIUM-DEXTROSE 2-4 GM/100ML-% IV SOLN
INTRAVENOUS | Status: AC
  Filled 2023-08-26: qty 100

## 2023-08-26 MED ORDER — LABETALOL HCL 5 MG/ML IV SOLN
10.0000 mg | INTRAVENOUS | Status: DC | PRN
  Administered 2023-08-27 – 2023-08-29 (×7): 10 mg via INTRAVENOUS
  Filled 2023-08-26 (×6): qty 4

## 2023-08-26 MED ORDER — FENTANYL CITRATE (PF) 250 MCG/5ML IJ SOLN
INTRAMUSCULAR | Status: DC | PRN
  Administered 2023-08-26: 50 ug via INTRAVENOUS
  Administered 2023-08-26: 75 ug via INTRAVENOUS
  Administered 2023-08-26: 25 ug via INTRAVENOUS
  Administered 2023-08-26 (×2): 50 ug via INTRAVENOUS

## 2023-08-26 MED ORDER — SUGAMMADEX SODIUM 200 MG/2ML IV SOLN
INTRAVENOUS | Status: DC | PRN
  Administered 2023-08-26: 200 mg via INTRAVENOUS

## 2023-08-26 MED ORDER — ONDANSETRON HCL 4 MG/2ML IJ SOLN
INTRAMUSCULAR | Status: DC | PRN
  Administered 2023-08-26: 4 mg via INTRAVENOUS

## 2023-08-26 MED ORDER — ESMOLOL HCL-SODIUM CHLORIDE 2000 MG/100ML IV SOLN: 25.0000 ug/kg/min | INTRAVENOUS | Status: DC

## 2023-08-26 MED ORDER — ACETAMINOPHEN 325 MG PO TABS
325.0000 mg | ORAL_TABLET | ORAL | Status: DC | PRN
  Administered 2023-08-27: 650 mg via ORAL
  Filled 2023-08-26: qty 2

## 2023-08-26 MED ORDER — HYDRALAZINE HCL 20 MG/ML IJ SOLN
10.0000 mg | INTRAMUSCULAR | Status: DC | PRN
  Administered 2023-08-27 – 2023-08-29 (×3): 10 mg via INTRAVENOUS
  Filled 2023-08-26 (×3): qty 1

## 2023-08-26 MED ORDER — ALBUMIN HUMAN 5 % IV SOLN: INTRAVENOUS | Status: DC | PRN

## 2023-08-26 MED ORDER — BISACODYL 5 MG PO TBEC: 5.0000 mg | DELAYED_RELEASE_TABLET | Freq: Every day | ORAL | Status: DC | PRN

## 2023-08-26 MED ORDER — MORPHINE SULFATE (PF) 2 MG/ML IV SOLN: 2.0000 mg | INTRAVENOUS | Status: DC | PRN

## 2023-08-26 MED ORDER — SODIUM BICARBONATE 8.4 % IV SOLN
INTRAVENOUS | Status: AC
  Filled 2023-08-26: qty 100

## 2023-08-26 MED ORDER — SODIUM CHLORIDE 0.9 % IV SOLN: INTRAVENOUS | Status: DC

## 2023-08-26 MED ORDER — CLEVIDIPINE BUTYRATE 0.5 MG/ML IV EMUL
0.0000 mg/h | INTRAVENOUS | Status: DC
  Administered 2023-08-26: 21 mg/h via INTRAVENOUS
  Administered 2023-08-26: 19 mg/h via INTRAVENOUS
  Administered 2023-08-26: 8 mg/h via INTRAVENOUS
  Administered 2023-08-26: 18 mg/h via INTRAVENOUS
  Administered 2023-08-26 – 2023-08-27 (×2): 19 mg/h via INTRAVENOUS
  Administered 2023-08-27: 17 mg/h via INTRAVENOUS
  Administered 2023-08-27: 10 mg/h via INTRAVENOUS
  Administered 2023-08-27: 7 mg/h via INTRAVENOUS
  Administered 2023-08-27: 19 mg/h via INTRAVENOUS
  Administered 2023-08-28: 12 mg/h via INTRAVENOUS
  Administered 2023-08-28: 11 mg/h via INTRAVENOUS
  Administered 2023-08-29: 2 mg/h via INTRAVENOUS
  Administered 2023-08-29: 17 mg/h via INTRAVENOUS
  Administered 2023-08-29: 21 mg/h via INTRAVENOUS
  Filled 2023-08-26 (×16): qty 100

## 2023-08-26 MED ORDER — CEFAZOLIN SODIUM-DEXTROSE 2-4 GM/100ML-% IV SOLN
2.0000 g | INTRAVENOUS | Status: AC
  Administered 2023-08-26 (×2): 2 g via INTRAVENOUS
  Filled 2023-08-26: qty 100

## 2023-08-26 MED ORDER — ROCURONIUM BROMIDE 10 MG/ML (PF) SYRINGE
PREFILLED_SYRINGE | INTRAVENOUS | Status: DC | PRN
  Administered 2023-08-26 (×4): 20 mg via INTRAVENOUS
  Administered 2023-08-26: 60 mg via INTRAVENOUS
  Administered 2023-08-26: 20 mg via INTRAVENOUS
  Administered 2023-08-26: 30 mg via INTRAVENOUS
  Administered 2023-08-26 (×3): 20 mg via INTRAVENOUS

## 2023-08-26 MED ORDER — PHENYLEPHRINE HCL-NACL 20-0.9 MG/250ML-% IV SOLN: INTRAVENOUS | Status: DC | PRN

## 2023-08-26 MED ORDER — PROTAMINE SULFATE 10 MG/ML IV SOLN
INTRAVENOUS | Status: DC | PRN
  Administered 2023-08-26: 50 mg via INTRAVENOUS

## 2023-08-26 MED ORDER — CEFAZOLIN SODIUM-DEXTROSE 2-4 GM/100ML-% IV SOLN
2.0000 g | Freq: Three times a day (TID) | INTRAVENOUS | Status: AC
  Administered 2023-08-26 – 2023-08-27 (×2): 2 g via INTRAVENOUS
  Filled 2023-08-26 (×2): qty 100

## 2023-08-26 MED ORDER — DEXAMETHASONE SODIUM PHOSPHATE 10 MG/ML IJ SOLN
INTRAMUSCULAR | Status: DC | PRN
  Administered 2023-08-26: 10 mg via INTRAVENOUS

## 2023-08-26 MED ORDER — SODIUM CHLORIDE 0.9 % IV SOLN: INTRAVENOUS | Status: AC

## 2023-08-26 MED ORDER — HEPARIN SODIUM (PORCINE) 1000 UNIT/ML IJ SOLN
INTRAMUSCULAR | Status: DC | PRN
  Administered 2023-08-26: 2000 [IU] via INTRAVENOUS
  Administered 2023-08-26: 10000 [IU] via INTRAVENOUS
  Administered 2023-08-26: 4000 [IU] via INTRAVENOUS
  Administered 2023-08-26: 1000 [IU] via INTRAVENOUS
  Administered 2023-08-26 (×2): 3000 [IU] via INTRAVENOUS

## 2023-08-26 MED ORDER — LOSARTAN POTASSIUM 25 MG PO TABS
12.5000 mg | ORAL_TABLET | Freq: Every day | ORAL | Status: DC
  Administered 2023-08-26 – 2023-08-27 (×2): 12.5 mg via ORAL
  Filled 2023-08-26 (×2): qty 1

## 2023-08-26 MED ORDER — ACETAMINOPHEN 325 MG RE SUPP: 325.0000 mg | RECTAL | Status: DC | PRN

## 2023-08-26 SURGICAL SUPPLY — 42 items
BAG SNAP BAND KOVER 36X36 (MISCELLANEOUS) IMPLANT
BALLOON MUSTANG 7X20X135 (BALLOONS) IMPLANT
CATH ACCU-VU SIZ PIG 5F 100CM (CATHETERS) IMPLANT
CATH ANGIO 5F BER 100CM (CATHETERS) IMPLANT
CATH ANGIO 5F BER2 100CM (CATHETERS) IMPLANT
CATH NAVICROSS ST .035X90CM (MICROCATHETER) IMPLANT
CLOSURE PERCLOSE PROSTYLE (VASCULAR PRODUCTS) IMPLANT
COVER DOME SNAP 22 D (MISCELLANEOUS) IMPLANT
DEVICE ENSNARE 12MMX20MM (VASCULAR PRODUCTS) IMPLANT
DEVICE EXC TAMBE 31X20 22FR (Endovascular Graft) IMPLANT
DEVICE TORQUE .014-.018 (MISCELLANEOUS) IMPLANT
DEVICE TORQUE .025-.038 (MISCELLANEOUS) IMPLANT
ENDOPROSTHESIS ILI 23X12X10 FA (Endovascular Graft) IMPLANT
ENDOPROSTHESIS ILI 23X14X10 FA (Endovascular Graft) IMPLANT
GLIDEWIRE ADV .035X260CM (WIRE) IMPLANT
KIT ENCORE 26 ADVANTAGE (KITS) IMPLANT
KIT ESSENTIALS PG (KITS) IMPLANT
KIT MICROPUNCTURE NIT STIFF (SHEATH) IMPLANT
KIT PV (KITS) IMPLANT
KIT SYRINGE INJ CVI SPIKEX1 (MISCELLANEOUS) IMPLANT
LEG CONTRALATERAL 27X12 (Vascular Products) IMPLANT
PACK ENDOVASCULAR (PACKS) IMPLANT
PAD PRO RADIOLUCENT 2001M-C (PAD) IMPLANT
SET ATX-X65L (MISCELLANEOUS) IMPLANT
SHEATH DRYSEAL FLEX 12FR 45CM (SHEATH) IMPLANT
SHEATH DRYSEAL FLEX 14FR 33CM (SHEATH) IMPLANT
SHEATH DRYSEAL FLEX 16FR 33CM (SHEATH) IMPLANT
SHEATH DRYSEAL FLEX 22FR 33CM (SHEATH) IMPLANT
SHEATH PINNACLE 8F 10CM (SHEATH) IMPLANT
SHEATH PROBE COVER 6X72 (BAG) IMPLANT
STENT GRAFT CONTRALAT 16X20X9. (Endovascular Graft) IMPLANT
STENT VIABAHN 11X59X135 (Permanent Stent) IMPLANT
STENT VIABAHN 6X59 6FR 135 (Permanent Stent) IMPLANT
STENT VIABAHN 6X79 6FR 135 (Permanent Stent) IMPLANT
STENT VIABAHN 9X39 7FR 135 (Permanent Stent) IMPLANT
STENT VIABAHN 9X59 7FR 135 (Permanent Stent) IMPLANT
SURGIFLO W/THROMBIN 8M KIT (HEMOSTASIS) IMPLANT
WIRE AMPLATZ SS-J .035X180CM (WIRE) IMPLANT
WIRE BENTSON .035X145CM (WIRE) IMPLANT
WIRE G V18X300CM (WIRE) IMPLANT
WIRE ROSEN-J .035X260CM (WIRE) IMPLANT
WIRE TORQFLEX AUST .018X40CM (WIRE) IMPLANT

## 2023-08-26 NOTE — Op Note (Addendum)
 Patient name: Mark Hooper MRN: [id number] DOB: 06/12/1945 Sex: male  08/26/2023 Pre-operative Diagnosis: Thoracoabdominal aneurysm Post-operative diagnosis:  Same Surgeon:  Malvina New Co-surgeon: Fonda Cheryle Rim Procedure:   #1: Endovascular pair of thoracic aortic aneurysm without coverage of left subclavian artery (66118)   #2: Endovascular abdominal aortic aneurysm (65294)   #3: Endovascular aortic extension (65290)   #4: Iliac branch endoprosthesis (65282)   #5: Stent, celiac artery   #6: Stent, superior mesenteric artery   #7: Stent, right renal artery   #8: Stent, left renal artery   #9: Ultrasound-guided bilateral common femoral artery percutaneous access (65286)   #10: Open axillary artery exposure   #11: NTAP (new technology add-on procedure) Anesthesia:   General Blood Loss:  200 Specimens:  none  Findings: Complete exclusion Devices used: TAMBE: 43x120x160 IBE: Right 23x12x10 EVAR:  23x14x10 Right Bridge: 27x12 Left iliac extension: 20x10 Right hypo stent:  VBX 11x59 Celiac stent: 9x39 x 2 SMA stent: 9x59 Right renal stent:  6x59 Left renal stent:  6x79  Indications: This is a 78 year old gentleman with 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  Multiple discussions with the patient and wife for performed discussing the need for surgical repair to prevent rupture.  Because of the extensive nature of his aneurysmal disease, this will be done in stages.  He comes in today for the aneurysmal iliac vessels as well as the juxtarenal aorta and distal thoracic aorta.  The procedure was discussed in great detail with the patient and his wife.  Specifically we addressed complications ranging from paralysis to renal insufficiency and colon  ischemia and death.  All questions were answered and they wish to proceed.  Procedure:  The patient was identified in the holding area and taken to [facility] (SURGICAL)  The patient was then placed supine on the table. general anesthesia was administered.  The patient was prepped and draped in the usual sterile fashion.  A time out was called and antibiotics were administered.  An experienced assistant was required for this procedure due to the complexity.  Please see Dr. Rim note for his involvement.  He performed the axillary artery exposure as well as visceral vessel cannulation and stenting.  Ultrasound was used to evaluate bilateral common femoral arteries which were heavily calcified.  A #11 blade was used to make a skin nick.  A micropuncture needle was used to cannulate bilateral common femoral arteries under ultrasound guidance.  An 018 wire was inserted followed by placement of a micropuncture sheath.  Next a Bentson wire was inserted and the subcutaneous track was dilated with 8 [place] dilators and Pro-glide devices were deployed at the 11:00 and 1 o'clock position for preclosure and 8 French sheaths were placed bilaterally.  Attention was then turned towards the left infraclavicular region.  A transverse incision was made below the clavicle.  Cautery was used to divide subcutaneous tissue and fascia.  The muscle fibers of the pectoralis major muscle were separated.  The axillary artery was then exposed and fully mobilized.  It was a small but soft disease-free artery.  It was encircled with Vesseloops.  At this point, the patient was fully heparinized.  Heparin  levels were continuously monitored and redosed appropriately  The first portion of the  procedure was iliac branch device placement.  A 16 French sheath was advanced up the right leg over a Amplatz wire and a 12 French sheath was inserted on the left.  Next, a Glidewire advantage was inserted up the right and snared with a ensnare from the  left and brought out through the sheath establishing through and through access.  The iliac branch device was prepared on the back table.  It was advanced over the Amplatz wire and the Glidewire advantage.  This was a Gore IBE 23 x 12 x 10 device.  We made sure there was no wire wrap.  Contrast injections were then performed locating the origin of the right internal iliac artery.  The device was positioned appropriately and deployed down to the portal.  Next the dilator from the 12 French sheath was inserted in the 12 [place] sheath was advanced over the bifurcation to the level of the portal.  Using a Glidewire advantage, the internal iliac artery on the right was cannulated.  It was advanced out a smaller branch and a glide catheter was placed so that we could insert a Rosen wire.  We then stented the right internal iliac artery with a VBX 11 x 59 making sure not to cover any branches.  The portal was then dilated with a 14 balloon.  The remaining portion of the device was deployed into the right external iliac artery.  This concluded the iliac branch device.  We then set up for the [facility] repair.  A micropuncture needle was used to cannulate the left axillary artery.  A Glidewire advantage was directed into the descending thoracic aorta.  Next a 14 French sheath was inserted into the left axillary artery and down into the descending thoracic aorta.  We then upsized to a 26 French sheath in the left groin into the aorta.  A JAG wire was then placed and grasped with a snare from the sheath in the left groin and brought out establishing through and through access.  The TAMBE graft was then prepared on the back table.  This was a 43 x 120 x 160 device.  It was loaded on the Jagwire.  We then passed the multilumen catheter from the axillary artery out the sheath in the left groin.  We loaded to the-18 wires through the lumens and inserted them into the celiac and left renal stylette's on the TAMBE graft.  The  multilumen catheter was then brought out the sheath in the axillary artery and then reinserted over the Jagwire so that 2 additional V-18 wires could be advanced through an brought through the portals of the SMA and right renal artery.  With the [facility] graft properly loaded, it was inserted into the abdomen.  A distal thoracic angiogram was then performed and a RAO 54 obliquity which identified the visceral vessels.  Next, a 8 French sheath was inserted over the artery to the celiac portal wire and advanced through the portal.  Using a Glidewire advantage and a Berenstein 2 catheter the celiac artery was selected.  A Rosen wire was able to be placed out into the splenic artery.  The 8 French sheath was removed and placed over the SMA portal wire.  The superior mesenteric artery was then cannulated with a Glidewire advantage and a Berenstein 2 catheter.  A Rosen wire was then placed.  We then changed our obliquities and cannulated and placed Rosen wires out into the left renal and right renal artery.  Selective injections were  taken through all 4 vessels to confirm successful cannulation.  The 8 French sheath was then kept over the [place] wire in the right renal artery.  We made sure the [facility] graft was properly oriented and then proceeded with the next stage of deployment.  The V-18 portal wire was removed from the right renal portal and a 6 x 59 VBX stent was inserted out into the right renal artery just proximal to its major branch.  The patient does have an accessory right renal artery and contrast injections through the right renal artery showed opacification of two thirds of the kidney.  The VBX stent was then deployed with the proper overlap.  The balloon was taken to the appropriate atmospheres.  The 8 French sheath was then removed and inserted over the [place] wire into the left renal artery.  We confirmed proper location for the stent, removed the V-18 portal wire and deployed a VBX 6 x 79 stent with the  appropriate overlap to the portal.  Balloon inflation was performed deploying the stent.  The balloon was also inflated to a higher atmosphere in the portal making sure that it was widely patent.  Next attention was turned towards the celiac artery.  The 8 French sheath was advanced over the [place] wire to the celiac artery.  We took several injections to make sure the device with deployed properly in order to achieve the appropriate position of the VBX stent, we had to use 2 overlapping 9 x 39 VBX stents.  Finally attention was turned towards the superior mesenteric artery.  A 9 x 59 stent was successfully deployed.  The remaining TAMBE graft was deployed and the delivery system removed.  Next a bifurcated graft was inserted this was a 23 x 14 x 10 IBE device it was deployed with the proper overlap.  The contralateral gate was then cannulated with a Glidewire advantage and Berenstein 2 catheter.  Once this was done a pigtail catheter was able to be freely rotated within the main body of the graft, confirming successful cannulation.  A contrast injection was then performed locating the left hypogastric artery a left iliac extension was then successfully deployed just landing proximal to the hypogastric artery on the left.  This is a 20 x 10 device.  We then deployed a bridging stent on the right between the IBE and the bifurcated graft.  This was a 27 x 12 device.  Next, a MOB balloon was used to mold the appropriate overlap sites and distal stents.  A completion angiogram was then performed.  This showed successful deployment of the graft with preservation of blood flow through all of the branches.  There was no evidence of endoleak.  We were satisfied with these results.  We set up for closure.  The Pro-glide devices were close down on the right side and the sheath was removed.  There was good hemostasis.  We then closed the left groin similarly.  We did insert a micropuncture sheath into the left femoral artery  and shot a femoral angiogram that showed no concerns.  We then ran a catheter down from the axillary sheath into the right external iliac artery and shot angiography of the right groin that showed no issues with the closure.  The patient was found to have brisk pedal Doppler signals.  Both groins were hemostatic.  Next attention was turned towards the axillary sheath.  This was removed and clamps were placed proximal and distal to the arteriotomy site.  The arteriotomy site was repaired with a 5-0 Prolene.  There was an excellent axillary Doppler signal.  At this point the patient's heparin  was reversed with 50 mg of protamine .  Once hemostasis was satisfactory the left axillary incision was closed by reapproximating the fascia with 2-0 Vicryl.  Subtenons tissue was closed with 3-0 Vicryl followed by subcuticular closure.  Cautery was used on the groin incisions.  Dermabond was placed on all 3 incisions.  The patient was then successfully extubated.  He was found to be moving all 4 extremities to command.  He was taken the recovery room in stable condition   Disposition:  to PACU stable.   ALONSO Malvina New, M.D., FACS [facility] [place] Office: [phone] Pager:  [phone]    Please reach out to [facility] for proper coding [facility] [place] [email or web address] (C)  [phone]

## 2023-08-26 NOTE — Op Note (Signed)
 NAME: Mark Hooper    MRN: [id number] DOB: [birth year]    DATE OF OPERATION: 08/26/2023  PREOP DIAGNOSIS:    Thoracoabdominal aneurysm  POSTOP DIAGNOSIS:    Same  PROCEDURE:    Ultrasound-guided micropuncture access of bilateral common femoral arteries in retrograde fashion Exposure of the axillary artery with primary repair Endovascular repair of thoracic aortic aneurysm without coverage of the left subclavian artery Endovascular abdominal aortic aneurysm repair Endovascular aortic extension Iliac branch endoprosthesis Celiac artery stent placement Superior mesenteric artery stent placement Right renal artery stent placement Left renal artery stent placement   SURGEON: Fonda FORBES Rim Co-surgeon, Malvina New  ANESTHESIA: General  EBL: 200 mL  INDICATIONS:    Mark Hooper is a 78 y.o. 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 Multiple discussions with the patient and wife for performed discussing the need for surgical repair to prevent rupture. Because of the extensive nature of his aneurysmal disease, this will be done in stages. He comes in today for the aneurysmal iliac vessels as well as the juxtarenal aorta and distal thoracic aorta. The procedure was discussed in great detail with the patient and his wife. Specifically we addressed complications ranging from paralysis to renal insufficiency and colon ischemia and death. All questions were answered and they wish to proceed.   FINDINGS:   Complete exclusion Devices used: TAMBE: 43x120x160 IBE: Right 23x12x10 EVAR:  23x14x10 Right Bridge: 27x12 Left iliac extension: 20x10 Right hypo stent:  VBX 11x59 Celiac stent: 9x39 x 2 SMA stent: 9x59 Right renal stent:  6x59 Left renal  stent:  6x79  TECHNIQUE:   Patient was brought to the OR laid in supine position.  General anesthesia was induced and patient was prepped and draped in standard fashion.  The case began with ultrasound-guided micropuncture access of bilateral common femoral arteries in retrograde fashion.  This was performed by Dr. New.  I worked cephalad performing an open exposure of the axillary artery.  Next, we both moved to the groin.  2 Pro-glide's were placed in each groin in preclose fashion.  Next, we worked to deploy an endovascular branch endoprosthesis device from [employer].  Wires were run up into the thoracic aorta, and a 16 French sheath was placed in the patient's right groin.  A 12 French sheath was placed in the left groin.  The left-sided wire was then snared to the right.  The endoprosthesis device was loaded onto the aortic wire in the snared wire and delivered into the right common iliac artery.  This was deployed in the left sided 12 [place] sheath driven into the internal iliac gate.  A Gore VBX 11 x 59 stent was then driven into the right internal iliac artery and expanded.  The remainder of the device was explanted without issue.,  Next we moved to the thoracoabdominal portion of the case.  The left axillary artery was accessed using a micropuncture needle, and a wire driven into the infrarenal aorta.  This was snared and pulled through and through, the left groin.  With the wire externalized, this was used as a rail.  A 22 French Gore sheath was then driven from the left groin into the infrarenal aorta.  A 14 French sheath was driven from the axillary artery into  the descending thoracic aorta.    Fonda FORBES Rim, MD [facility] DATE OF DICTATION:   08/26/2023

## 2023-08-26 NOTE — Progress Notes (Signed)
 Seen postoperatively. Doing well.  Left axillary incision without hematoma Groin access both soft Weakly palpable Dps. Able to lift legs off bed. Bloody urine - noted during case.  Hypertensive to 180 despite cleviprex . Will start esmolol  to keep SBP 120-160. Monitor UOP.  Debby SAILOR. Magda, MD [facility] Phone Number: [phone] 08/26/2023 4:43 PM

## 2023-08-26 NOTE — Anesthesia Procedure Notes (Signed)
 Procedure Name: Intubation Date/Time: 08/26/2023 8:36 AM  Performed by: Sharie Joesph BRAVO, RNPre-anesthesia Checklist: Patient identified, Emergency Drugs available, Suction available and Patient being monitored Patient Re-evaluated:Patient Re-evaluated prior to induction Oxygen Delivery Method: Circle system utilized Preoxygenation: Pre-oxygenation with 100% oxygen Induction Type: IV induction Ventilation: Oral airway inserted - appropriate to patient size Laryngoscope Size: Mac and 4 Grade View: Grade II Tube type: Oral Number of attempts: 1 Airway Equipment and Method: Stylet and Oral airway Placement Confirmation: ETT inserted through vocal cords under direct vision, positive ETCO2 and breath sounds checked- equal and bilateral Secured at: 22 cm Tube secured with: Tape Dental Injury: Teeth and Oropharynx as per pre-operative assessment

## 2023-08-26 NOTE — Transfer of Care (Signed)
 Immediate Anesthesia Transfer of Care Note  Patient: Mark Hooper  Procedure(s) Performed: [facility] ANEURYSM REPAIR VASCULAR CUTDOWN ABDOMINAL AORTOGRAM LOWER EXTREMITY INTERVENTION  Patient Location: PACU  Anesthesia Type:General  Level of Consciousness: drowsy  Airway & Oxygen Therapy: Patient Spontanous Breathing and Patient connected to face mask oxygen  Post-op Assessment: Report given to RN and Post -op Vital signs reviewed and stable  Post vital signs: Reviewed and stable  Last Vitals:  Vitals Value Taken Time  BP 161/81 08/26/23 14:40  Temp 37.1 C 08/26/23 14:40  Pulse 78 08/26/23 14:40  Resp 28 08/26/23 14:40  SpO2 96 % 08/26/23 14:40    Last Pain:  Vitals:   08/26/23 0849  TempSrc:   PainSc: 0-No pain         Complications: No notable events documented.

## 2023-08-26 NOTE — Anesthesia Postprocedure Evaluation (Signed)
 Anesthesia Post Note  Patient: Mark Hooper  Procedure(s) Performed: [facility] ANEURYSM REPAIR VASCULAR CUTDOWN ABDOMINAL AORTOGRAM LOWER EXTREMITY INTERVENTION     Patient location during evaluation: PACU Anesthesia Type: General Level of consciousness: awake and alert Pain management: pain level controlled Vital Signs Assessment: post-procedure vital signs reviewed and stable Respiratory status: spontaneous breathing, nonlabored ventilation, respiratory function stable and patient connected to nasal cannula oxygen Cardiovascular status: blood pressure returned to baseline and stable Postop Assessment: no apparent nausea or vomiting Anesthetic complications: no   Encounter Notable Events  Notable Event Outcome Phase Comment  None  Intraprocedure     Last Vitals:  Vitals:   08/26/23 1515 08/26/23 1530  BP: (!) 159/78 (!) 159/78  Pulse: 89 91  Resp: 19 18  Temp:  (!) 36.4 C  SpO2: 93% 94%    Last Pain:  Vitals:   08/26/23 1515  TempSrc:   PainSc: 0-No pain                 [name]

## 2023-08-26 NOTE — Interval H&P Note (Signed)
 History and Physical Interval Note:  08/26/2023 8:16 AM  Mark Hooper  has presented today for surgery, with the diagnosis of juxtarenal abdominal aortic anyuresym.  The various methods of treatment have been discussed with the patient and family. After consideration of risks, benefits and other options for treatment, the patient has consented to  Procedure(s) with comments: THORACO-ABOMINAL ANEURYSM REPAIR (N/A) - A-Line - Right Arm VASCULAR CUTDOWN as a surgical intervention.  The patient's history has been reviewed, patient examined, no change in status, stable for surgery.  I have reviewed the patient's chart and labs.  Questions were answered to the patient's satisfaction.     Malvina New

## 2023-08-27 ENCOUNTER — Encounter (HOSPITAL_COMMUNITY): Payer: Self-pay | Admitting: Vascular Surgery

## 2023-08-27 DIAGNOSIS — Z95828 Presence of other vascular implants and grafts: Secondary | ICD-10-CM

## 2023-08-27 DIAGNOSIS — Z9889 Other specified postprocedural states: Secondary | ICD-10-CM

## 2023-08-27 LAB — CBC
HCT: 39.6 % (ref 39.0–52.0)
Hemoglobin: 12.9 g/dL — ABNORMAL LOW (ref 13.0–17.0)
MCH: 31.2 pg (ref 26.0–34.0)
MCHC: 32.6 g/dL (ref 30.0–36.0)
MCV: 95.7 fL (ref 80.0–100.0)
Platelets: 199 K/uL (ref 150–400)
RBC: 4.14 MIL/uL — ABNORMAL LOW (ref 4.22–5.81)
RDW: 13.6 % (ref 11.5–15.5)
WBC: 15.1 K/uL — ABNORMAL HIGH (ref 4.0–10.5)
nRBC: 0 % (ref 0.0–0.2)

## 2023-08-27 LAB — GLUCOSE, CAPILLARY
Glucose-Capillary: 176 mg/dL — ABNORMAL HIGH (ref 70–99)
Glucose-Capillary: 147 mg/dL — ABNORMAL HIGH (ref 70–99)

## 2023-08-27 LAB — BASIC METABOLIC PANEL WITH GFR
Anion gap: 12 (ref 5–15)
BUN: 18 mg/dL (ref 8–23)
CO2: 21 mmol/L — ABNORMAL LOW (ref 22–32)
Calcium: 9.1 mg/dL (ref 8.9–10.3)
Chloride: 106 mmol/L (ref 98–111)
Creatinine, Ser: 1.17 mg/dL (ref 0.61–1.24)
GFR, Estimated: >60 mL/min (ref >60)
Glucose, Bld: 140 mg/dL — ABNORMAL HIGH (ref 70–99)
Potassium: 4.1 mmol/L (ref 3.5–5.1)
Sodium: 139 mmol/L (ref 135–145)

## 2023-08-27 NOTE — Progress Notes (Signed)
 Subjective  - POD #1 s/p TAMBE and IBE  No complaints this morning   Physical Exam:  Palpable pedal pulses Incisions are clean dry and intact without hematoma Neurologically intact.  No motor or sensory deficits in the lower extremity      Assessment/Plan:  POD #1  -HTN: Patient remains on Cleviprex  drip.  His ARB has been restarted.  Will add as needed hydralazine .  Target blood pressure is 120-160.  Continue A-line while trying to wean off Cleviprex  -GU: Had red-colored urine in his Foley yesterday which cleared up overnight and his Foley catheter has not been removed.  He is voiding on his own which is a dark yellow color.  Continue to monitor.  Creatinine is at baseline -No evidence of spinal cord ischemia.  He has normal motor and sensory function of both lower extremities - Prophylaxis: Subcutaneous heparin  and Protonix  -Activity: Patient will be out of bed ambulating today -Continue ICU monitoring while on Cleviprex .  Once this has been discontinued, can transfer to. - Acute blood loss anemia: Hemoglobin 12.9 this morning.  No concerns for bleeding.  Wells Catrell Morrone 08/27/2023 7:44 AM --  Vitals:   08/27/23 0700 08/27/23 0715  BP:    Pulse: 65 64  Resp: (!) 22 19  Temp:    SpO2: 93% 92%    Intake/Output Summary (Last 24 hours) at 08/27/2023 0744 Last data filed at 08/27/2023 0626 Gross per 24 hour  Intake 5204.92 ml  Output 1965 ml  Net 3239.92 ml     Laboratory CBC    Component Value Date/Time   WBC 15.1 (H) 08/27/2023 0458   HGB 12.9 (L) 08/27/2023 0458   HGB 15.4 02/14/2022 1024   HCT 39.6 08/27/2023 0458   HCT 46.1 02/14/2022 1024   PLT 199 08/27/2023 0458   PLT 281 02/14/2022 1024    BMET    Component Value Date/Time   NA 139 08/27/2023 0458   NA 141 02/14/2022 1024   NA 140 08/11/2013 0432   K 4.1 08/27/2023 0458   K 3.3 (L) 08/11/2013 0432   CL 106 08/27/2023 0458   CL 107 08/11/2013 0432   CO2 21 (L) 08/27/2023 0458   CO2 27  08/11/2013 0432   GLUCOSE 140 (H) 08/27/2023 0458   GLUCOSE 102 (H) 08/11/2013 0432   BUN 18 08/27/2023 0458   BUN 17 02/14/2022 1024   BUN 8 08/11/2013 0432   CREATININE 1.17 08/27/2023 0458   CREATININE 0.83 08/11/2013 0432   CALCIUM  9.1 08/27/2023 0458   CALCIUM  8.6 08/11/2013 0432   GFRNONAA >60 08/27/2023 0458   GFRNONAA >60 08/11/2013 0432   GFRAA 95 01/18/2020 1144   GFRAA >60 08/11/2013 0432    COAG Lab Results  Component Value Date   INR 1.0 08/26/2023   INR 1.1 08/11/2013   No results found for: PTT  Antibiotics Anti-infectives (From admission, onward)    Start     Dose/Rate Route Frequency Ordered Stop   08/26/23 2000  ceFAZolin  (ANCEF ) IVPB 2g/100 mL premix        2 g 200 mL/hr over 30 Minutes Intravenous Every 8 hours 08/26/23 1533 08/27/23 0524   08/26/23 0639  ceFAZolin  (ANCEF ) IVPB 2g/100 mL premix        2 g 200 mL/hr over 30 Minutes Intravenous 30 min pre-op 08/26/23 0639 08/26/23 1224        V. Malvina Serene CLORE, M.D., [facility] [place] Office: [phone] Pager:  [phone]

## 2023-08-27 NOTE — Plan of Care (Signed)

## 2023-08-27 NOTE — Plan of Care (Signed)
  Problem: Education: Goal: Knowledge of General Education information will improve Description: Including pain rating scale, medication(s)/side effects and non-pharmacologic comfort measures Outcome: Progressing   Problem: Health Behavior/Discharge Planning: Goal: Ability to manage health-related needs will improve Outcome: Progressing   Problem: Clinical Measurements: Goal: Ability to maintain clinical measurements within normal limits will improve Outcome: Progressing Goal: Will remain free from infection Outcome: Progressing Goal: Diagnostic test results will improve Outcome: Progressing Goal: Respiratory complications will improve Outcome: Progressing Goal: Cardiovascular complication will be avoided Outcome: Progressing   Problem: Activity: Goal: Risk for activity intolerance will decrease Outcome: Progressing   Problem: Nutrition: Goal: Adequate nutrition will be maintained Outcome: Progressing   Problem: Coping: Goal: Level of anxiety will decrease Outcome: Progressing   Problem: Elimination: Goal: Will not experience complications related to bowel motility Outcome: Progressing Goal: Will not experience complications related to urinary retention Outcome: Progressing   Problem: Pain Managment: Goal: General experience of comfort will improve and/or be controlled Outcome: Progressing   Problem: Safety: Goal: Ability to remain free from injury will improve Outcome: Progressing   Problem: Skin Integrity: Goal: Risk for impaired skin integrity will decrease Outcome: Progressing   Problem: Respiratory: Goal: Will achieve and/or maintain a regular respiratory rate, without signs or symptoms of dyspnea Outcome: Progressing   Problem: Skin Integrity: Goal: Demonstration of wound healing without infection will improve Outcome: Progressing

## 2023-08-27 NOTE — Progress Notes (Addendum)
 Progress Note    08/27/2023 7:38 AM 1 Day Post-Op  Subjective:  sleeping comfortably when I entered room. Denies any pain. Says he did not have much appetite last night. Has not yet mobilized. Voiding without difficulty   Vitals:   08/27/23 0700 08/27/23 0715  BP:    Pulse: 65 64  Resp: (!) 22 19  Temp:    SpO2: 93% 92%   Physical Exam: Cardiac:  regular Lungs:  non labored Incisions:  B groin access sites soft without swelling or hematoma. Left axillary incision with a little fullness but soft and intact Extremities:  moving all extremities without deficits. 2+ radial pulses, 2+ DP pulses bilaterally Abdomen:  soft, non distended, non tender Neurologic: alert and oriented   CBC    Component Value Date/Time   WBC 15.1 (H) 08/27/2023 0458   RBC 4.14 (L) 08/27/2023 0458   HGB 12.9 (L) 08/27/2023 0458   HGB 15.4 02/14/2022 1024   HCT 39.6 08/27/2023 0458   HCT 46.1 02/14/2022 1024   PLT 199 08/27/2023 0458   PLT 281 02/14/2022 1024   MCV 95.7 08/27/2023 0458   MCV 94 02/14/2022 1024   MCV 96 08/11/2013 0432   MCH 31.2 08/27/2023 0458   MCHC 32.6 08/27/2023 0458   RDW 13.6 08/27/2023 0458   RDW 12.4 02/14/2022 1024   RDW 13.3 08/11/2013 0432   LYMPHSABS 2.1 02/14/2022 1024   LYMPHSABS 2.3 08/11/2013 0432   MONOABS 0.8 08/11/2013 0432   EOSABS 0.2 02/14/2022 1024   EOSABS 0.2 08/11/2013 0432   BASOSABS 0.1 02/14/2022 1024   BASOSABS 0.0 08/11/2013 0432    BMET    Component Value Date/Time   NA 139 08/27/2023 0458   NA 141 02/14/2022 1024   NA 140 08/11/2013 0432   K 4.1 08/27/2023 0458   K 3.3 (L) 08/11/2013 0432   CL 106 08/27/2023 0458   CL 107 08/11/2013 0432   CO2 21 (L) 08/27/2023 0458   CO2 27 08/11/2013 0432   GLUCOSE 140 (H) 08/27/2023 0458   GLUCOSE 102 (H) 08/11/2013 0432   BUN 18 08/27/2023 0458   BUN 17 02/14/2022 1024   BUN 8 08/11/2013 0432   CREATININE 1.17 08/27/2023 0458   CREATININE 0.83 08/11/2013 0432   CALCIUM  9.1 08/27/2023  0458   CALCIUM  8.6 08/11/2013 0432   GFRNONAA >60 08/27/2023 0458   GFRNONAA >60 08/11/2013 0432   GFRAA 95 01/18/2020 1144   GFRAA >60 08/11/2013 0432    INR    Component Value Date/Time   INR 1.0 08/26/2023 1532   INR 1.1 08/11/2013 0432     Intake/Output Summary (Last 24 hours) at 08/27/2023 0738 Last data filed at 08/27/2023 9373 Gross per 24 hour  Intake 5204.92 ml  Output 1965 ml  Net 3239.92 ml     Assessment/Plan:  78 y.o. 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 Mild swelling but soft B groin access sites are soft without swelling or hematoma Extremities are all well perfused and warm with palpable pulses Scr stable. Good UOP WBC 15k but likely reactive Remains hypertensive on Cleviprex  and labetalol . Hopefully can wean off of these. Will resume home antihypertensives Hopefully we can transfer out of ICU later today Mobilize as tolerated If he continues to progress well plan will be for d/c tomorrow    [name], PA-C [facility] [phone] 08/27/2023 7:38 AM

## 2023-08-28 LAB — CBC
HCT: 40.5 % (ref 39.0–52.0)
Hemoglobin: 13.4 g/dL (ref 13.0–17.0)
MCH: 31.7 pg (ref 26.0–34.0)
MCHC: 33.1 g/dL (ref 30.0–36.0)
MCV: 95.7 fL (ref 80.0–100.0)
Platelets: 194 K/uL (ref 150–400)
RBC: 4.23 MIL/uL (ref 4.22–5.81)
RDW: 14.1 % (ref 11.5–15.5)
WBC: 15.5 K/uL — ABNORMAL HIGH (ref 4.0–10.5)
nRBC: 0 % (ref 0.0–0.2)

## 2023-08-28 LAB — BASIC METABOLIC PANEL WITH GFR
Anion gap: 10 (ref 5–15)
BUN: 21 mg/dL (ref 8–23)
CO2: 21 mmol/L — ABNORMAL LOW (ref 22–32)
Calcium: 9.1 mg/dL (ref 8.9–10.3)
Chloride: 106 mmol/L (ref 98–111)
Creatinine, Ser: 1.3 mg/dL — ABNORMAL HIGH (ref 0.61–1.24)
GFR, Estimated: 57 mL/min — ABNORMAL LOW (ref >60)
Glucose, Bld: 157 mg/dL — ABNORMAL HIGH (ref 70–99)
Potassium: 4 mmol/L (ref 3.5–5.1)
Sodium: 137 mmol/L (ref 135–145)

## 2023-08-28 LAB — TRIGLYCERIDES: Triglycerides: 276 mg/dL — ABNORMAL HIGH (ref <150)

## 2023-08-28 LAB — MAGNESIUM: Magnesium: 2.1 mg/dL (ref 1.7–2.4)

## 2023-08-28 MED ORDER — LOSARTAN POTASSIUM 25 MG PO TABS
25.0000 mg | ORAL_TABLET | Freq: Every day | ORAL | Status: DC
  Administered 2023-08-28 – 2023-08-30 (×3): 25 mg via ORAL
  Filled 2023-08-28 (×3): qty 1

## 2023-08-28 MED ORDER — SODIUM CHLORIDE 0.9 % IV SOLN: INTRAVENOUS | Status: DC

## 2023-08-28 MED ORDER — CHLORHEXIDINE GLUCONATE CLOTH 2 % EX PADS
6.0000 | MEDICATED_PAD | Freq: Every day | CUTANEOUS | Status: DC
  Administered 2023-08-28 – 2023-08-29 (×2): 6 via TOPICAL

## 2023-08-28 NOTE — TOC CM/SW Note (Signed)
 Transition of Care [facility]) - Inpatient Brief Assessment   Patient Details  Name: Mark Hooper MRN: [id number] Date of Birth: Mar 20, 1945  [facility]) CM/SW Contact:    Lauraine FORBES Saa, LCSW Phone Number: 08/28/2023, 12:25 PM   Clinical Narrative:  12:25 PM Per chart review, patient resides at home with spouse. Patient has a PCP and insurance. Patient does not have SNF/HH/DME history. Patient's preferred pharmacy's are [facility] and [facility] [address]. No TOC needs were identified at this time. TOC will continue to follow and be available to assist.  Transition of Care Asessment: Insurance and Status: Insurance coverage has been reviewed Patient has primary care physician: Yes Home environment has been reviewed: Private Residence Prior level of function:: N/A Prior/Current Home Services: No current home services Social Drivers of Health Review: SDOH reviewed no interventions necessary Readmission risk has been reviewed: Yes Transition of care needs: no transition of care needs at this time

## 2023-08-28 NOTE — Progress Notes (Addendum)
  Progress Note    08/28/2023 7:30 AM 2 Days Post-Op  Subjective:  no complaints except shivering because he is cold. Sitting up in chair. Denies any pain. Per RN overnight tried to wean Cleviprex  but SBP would > 160   Vitals:   08/28/23 0630 08/28/23 0645  BP:    Pulse: 76 74  Resp: (!) 21 (!) 22  Temp:    SpO2: 93% 94%   Physical Exam: Cardiac: regular Lungs:  non labored Incisions:  left axillary incision c/d/I without swelling or hematoma Extremities:  moving all extremities without deficits. Palpable radial and DP pulses bilaterally Abdomen:  soft, non distended Neurologic: alert and oriented  CBC    Component Value Date/Time   WBC 15.5 (H) 08/28/2023 0445   RBC 4.23 08/28/2023 0445   HGB 13.4 08/28/2023 0445   HGB 15.4 02/14/2022 1024   HCT 40.5 08/28/2023 0445   HCT 46.1 02/14/2022 1024   PLT 194 08/28/2023 0445   PLT 281 02/14/2022 1024   MCV 95.7 08/28/2023 0445   MCV 94 02/14/2022 1024   MCV 96 08/11/2013 0432   MCH 31.7 08/28/2023 0445   MCHC 33.1 08/28/2023 0445   RDW 14.1 08/28/2023 0445   RDW 12.4 02/14/2022 1024   RDW 13.3 08/11/2013 0432   LYMPHSABS 2.1 02/14/2022 1024   LYMPHSABS 2.3 08/11/2013 0432   MONOABS 0.8 08/11/2013 0432   EOSABS 0.2 02/14/2022 1024   EOSABS 0.2 08/11/2013 0432   BASOSABS 0.1 02/14/2022 1024   BASOSABS 0.0 08/11/2013 0432    BMET    Component Value Date/Time   NA 137 08/28/2023 0445   NA 141 02/14/2022 1024   NA 140 08/11/2013 0432   K 4.0 08/28/2023 0445   K 3.3 (L) 08/11/2013 0432   CL 106 08/28/2023 0445   CL 107 08/11/2013 0432   CO2 21 (L) 08/28/2023 0445   CO2 27 08/11/2013 0432   GLUCOSE 157 (H) 08/28/2023 0445   GLUCOSE 102 (H) 08/11/2013 0432   BUN 21 08/28/2023 0445   BUN 17 02/14/2022 1024   BUN 8 08/11/2013 0432   CREATININE 1.30 (H) 08/28/2023 0445   CREATININE 0.83 08/11/2013 0432   CALCIUM  9.1 08/28/2023 0445   CALCIUM  8.6 08/11/2013 0432   GFRNONAA 57 (L) 08/28/2023 0445   GFRNONAA  >60 08/11/2013 0432   GFRAA 95 01/18/2020 1144   GFRAA >60 08/11/2013 0432    INR    Component Value Date/Time   INR 1.0 08/26/2023 1532   INR 1.1 08/11/2013 0432     Intake/Output Summary (Last 24 hours) at 08/28/2023 0730 Last data filed at 08/28/2023 0725 Gross per 24 hour  Intake 1195.14 ml  Output 2250 ml  Net -1054.86 ml     Assessment/Plan:  78 y.o. male is s/p TAMBE and IBE  2 Days Post-Op   Incision intact clean and dry Extremities are all well perfused and warm. Moving without any deficits Remains on Cleviprex . Tried to wean overnight but SBP > 160. Currently on 8 Good UOP. Clear. Voiding without difficulty Tolerating diet H&H stable Scr up a little to 1.3 Mobilize as tolerated Hopefully transfer to floor if we can wean off Cleviprex     [name], PA-C [facility] [phone] 08/28/2023 7:30 AM  I agree with the above. Slight increase in creatinine, likely related to poor PO intake.  Will restart IVF -Still on HTN meds, will increase Cozaar  to 25 mgQD -OOB walking  Mark Hooper

## 2023-08-28 NOTE — Plan of Care (Signed)

## 2023-08-29 LAB — BASIC METABOLIC PANEL WITH GFR
Anion gap: 9 (ref 5–15)
BUN: 21 mg/dL (ref 8–23)
CO2: 22 mmol/L (ref 22–32)
Calcium: 9.4 mg/dL (ref 8.9–10.3)
Chloride: 106 mmol/L (ref 98–111)
Creatinine, Ser: 1.18 mg/dL (ref 0.61–1.24)
GFR, Estimated: >60 mL/min (ref >60)
Glucose, Bld: 141 mg/dL — ABNORMAL HIGH (ref 70–99)
Potassium: 3.7 mmol/L (ref 3.5–5.1)
Sodium: 137 mmol/L (ref 135–145)

## 2023-08-29 MED ORDER — POTASSIUM CHLORIDE CRYS ER 20 MEQ PO TBCR
40.0000 meq | EXTENDED_RELEASE_TABLET | Freq: Once | ORAL | Status: AC
  Administered 2023-08-29: 40 meq via ORAL
  Filled 2023-08-29: qty 2

## 2023-08-29 MED ORDER — METOPROLOL SUCCINATE ER 25 MG PO TB24
12.5000 mg | ORAL_TABLET | Freq: Two times a day (BID) | ORAL | Status: DC
  Administered 2023-08-29 – 2023-08-30 (×3): 12.5 mg via ORAL
  Filled 2023-08-29 (×3): qty 1

## 2023-08-29 MED ORDER — CLOPIDOGREL BISULFATE 75 MG PO TABS
75.0000 mg | ORAL_TABLET | Freq: Every day | ORAL | 3 refills | Status: DC
Start: 2023-08-30 — End: 2023-09-24

## 2023-08-29 MED ORDER — CLOPIDOGREL BISULFATE 75 MG PO TABS
75.0000 mg | ORAL_TABLET | Freq: Every day | ORAL | Status: DC
  Administered 2023-08-29 – 2023-08-30 (×2): 75 mg via ORAL
  Filled 2023-08-29 (×2): qty 1

## 2023-08-29 MED ORDER — METOPROLOL SUCCINATE ER 25 MG PO TB24: 12.5000 mg | ORAL_TABLET | Freq: Two times a day (BID) | ORAL | 0 refills | Status: DC

## 2023-08-29 MED ORDER — OXYCODONE HCL 5 MG PO TABS: 5.0000 mg | ORAL_TABLET | Freq: Four times a day (QID) | ORAL | 0 refills | Status: AC | PRN

## 2023-08-29 NOTE — Plan of Care (Signed)

## 2023-08-29 NOTE — Discharge Instructions (Signed)
   [facility]   Discharge Instructions  Endovascular Aortic Aneurysm Repair  Please refer to the following instructions for your post-procedure care. Your surgeon or Physician Assistant will discuss any changes with you.  Activity  You are encouraged to walk as much as you can. You can slowly return to normal activities but must avoid strenuous activity and heavy lifting until your doctor tells you it's OK. Avoid activities such as vacuuming or swinging a gold club. It is normal to feel tired for several weeks after your surgery. Do not drive until your doctor gives the OK and you are no longer taking prescription pain medications. It is also normal to have difficulty with sleep habits, eating, and bowel movements after surgery. These will go away with time.  Bathing/Showering  You may shower after you go home. If you have an incision, do not soak in a bathtub, hot tub, or swim until the incision heals completely.  Incision Care  Shower every day. Clean your incision with mild soap and water. Pat the area dry with a clean towel. You do not need a bandage unless otherwise instructed. Do not apply any ointments or creams to your incision. If you clothing is irritating, you may cover your incision with a dry gauze pad.  Diet  Resume your normal diet. There are no special food restrictions following this procedure. A low fat/low cholesterol diet is recommended for all patients with vascular disease. In order to heal from your surgery, it is CRITICAL to get adequate nutrition. Your body requires vitamins, minerals, and protein. Vegetables are the best source of vitamins and minerals. Vegetables also provide the perfect balance of protein. Processed food has little nutritional value, so try to avoid this.  Medications  Resume taking all of your medications unless your doctor or nurse practitioner tells you not to. If your incision is causing pain, you may take  over-the-counter pain relievers such as acetaminophen (Tylenol). If you were prescribed a stronger pain medication, please be aware these medications can cause nausea and constipation. Prevent nausea by taking the medication with a snack or meal. Avoid constipation by drinking plenty of fluids and eating foods with a high amount of fiber, such as fruits, vegetables, and grains. Do not take Tylenol if you are taking prescription pain medications.   Follow up  [facility] office will schedule a follow-up appointment with a C.T. scan 3-4 weeks after your surgery.  Please call us immediately for any of the following conditions  Severe or worsening pain in your legs or feet or in your abdomen back or chest. Increased pain, redness, drainage (pus) from your incision sit. Increased abdominal pain, bloating, nausea, vomiting or persistent diarrhea. Fever of 101 degrees or higher. Swelling in your leg (s),  Reduce your risk of vascular disease  Stop smoking. If you would like help call [facility] at 1-800-QUIT-NOW 819-462-0294) or [place] at [phone]. Manage your cholesterol Maintain a desired weight Control your diabetes Keep your blood pressure down  If you have questions, please call the office at [phone].

## 2023-08-29 NOTE — Progress Notes (Signed)
    Subjective  - POD #3  No complaints Cleviprex  restarted overnight   Physical Exam:  Left axillary, and bilateral groin sites are soft without hematoma Palpable pedal pulses Abdomen soft nontender       Assessment/Plan:  POD #3  Renal: Creatinine improved with gentle hydration HTN: Cleviprex  restarted despite increasing Cozaar  yesterday.  I am adding metoprolol  25 mg p.o. twice daily. Dispo: If we can get him off of his Cleviprex , I anticipate being able to discharge him home later today CV: Patient be discharged home on aspirin  and Plavix  given his recent vessel stenting. Neuro: No evidence of spinal cord ischemia Prophylaxis: Subcu heparin  Protonix  Activity: Patient has been out of bed and ambulating  Wells Liston Thum 08/29/2023 9:36 AM --  Vitals:   08/29/23 0830 08/29/23 0845  BP: (!) 124/58   Pulse: 84 79  Resp: (!) 24 20  Temp:    SpO2: 91% 93%    Intake/Output Summary (Last 24 hours) at 08/29/2023 0936 Last data filed at 08/29/2023 0900 Gross per 24 hour  Intake 540.21 ml  Output 1900 ml  Net -1359.79 ml     Laboratory CBC    Component Value Date/Time   WBC 15.5 (H) 08/28/2023 0445   HGB 13.4 08/28/2023 0445   HGB 15.4 02/14/2022 1024   HCT 40.5 08/28/2023 0445   HCT 46.1 02/14/2022 1024   PLT 194 08/28/2023 0445   PLT 281 02/14/2022 1024    BMET    Component Value Date/Time   NA 137 08/29/2023 0458   NA 141 02/14/2022 1024   NA 140 08/11/2013 0432   K 3.7 08/29/2023 0458   K 3.3 (L) 08/11/2013 0432   CL 106 08/29/2023 0458   CL 107 08/11/2013 0432   CO2 22 08/29/2023 0458   CO2 27 08/11/2013 0432   GLUCOSE 141 (H) 08/29/2023 0458   GLUCOSE 102 (H) 08/11/2013 0432   BUN 21 08/29/2023 0458   BUN 17 02/14/2022 1024   BUN 8 08/11/2013 0432   CREATININE 1.18 08/29/2023 0458   CREATININE 0.83 08/11/2013 0432   CALCIUM  9.4 08/29/2023 0458   CALCIUM  8.6 08/11/2013 0432   GFRNONAA >60 08/29/2023 0458   GFRNONAA >60 08/11/2013 0432    GFRAA 95 01/18/2020 1144   GFRAA >60 08/11/2013 0432    COAG Lab Results  Component Value Date   INR 1.0 08/26/2023   INR 1.1 08/11/2013   No results found for: PTT  Antibiotics Anti-infectives (From admission, onward)    Start     Dose/Rate Route Frequency Ordered Stop   08/26/23 2000  ceFAZolin  (ANCEF ) IVPB 2g/100 mL premix        2 g 200 mL/hr over 30 Minutes Intravenous Every 8 hours 08/26/23 1533 08/27/23 0524   08/26/23 0639  ceFAZolin  (ANCEF ) IVPB 2g/100 mL premix        2 g 200 mL/hr over 30 Minutes Intravenous 30 min pre-op 08/26/23 0639 08/26/23 1224        V. Malvina Serene CLORE, M.D., [facility] [place] Office: [phone] Pager:  [phone]

## 2023-08-30 LAB — BPAM RBC
Blood Product Expiration Date: 202508092359
Blood Product Expiration Date: 202508092359
Unit Type and Rh: 6200
Unit Type and Rh: 6200

## 2023-08-30 LAB — TYPE AND SCREEN
ABO/RH(D): A POS
Antibody Screen: NEGATIVE
Unit division: 0
Unit division: 0

## 2023-08-30 LAB — TRIGLYCERIDES: Triglycerides: 176 mg/dL — ABNORMAL HIGH (ref <150)

## 2023-08-30 MED ORDER — LOSARTAN POTASSIUM 25 MG PO TABS: 25.0000 mg | ORAL_TABLET | Freq: Every day | ORAL | 1 refills | Status: DC

## 2023-08-30 NOTE — Progress Notes (Signed)
 Off cleviprex  Plan for discharge   WB

## 2023-08-30 NOTE — Progress Notes (Signed)
 Discharge instructions and medications reviewed with patient and spouse. All questions answered. No acute changes at discharge. VSS. PIV's removed. Patient transported off unit via wheelchair with personal belongings.

## 2023-08-31 NOTE — Discharge Summary (Signed)
 [facility] Discharge Summary   Mark Hooper 1945/08/23 78 y.o. male  MRN: [id number]  Admission Date: 08/26/2023  Discharge Date: 08/30/23  Physician: Dr. Lanis  Admission Diagnosis: AAA (abdominal aortic aneurysm) [facility]) [I71.40]  Discharge Day services:   See progress note 08/30/2023  Hospital Course:  Mr. Mark Hooper is a 78 year old male who was brought in as an outpatient on 08/26/2023 and underwent endovascular repair of thoracoabdominal aneurysm including celiac stenting, SMA stenting, and bilateral renal artery stenting.  This was performed by Dr. Lanis and assisted by Dr. Serene.  The patient tolerated the procedure well and was admitted to the ICU postoperatively.  He required Cleviprex  and IV esmolol  due to postoperative hypertension.  During hospitalization he did not experience any signs or symptoms of spinal cord ischemia.  He was weaned from esmolol  however required IV Cleviprex  until day of discharge.  He was started on beta-blockade.  Cozaar  dosage was also increased during hospital stay.  He was also prescribed Plavix  due to mesenteric and renal stenting.  He maintained palpable DP pulses and did not show any signs of hematoma from incision sites throughout his hospital stay.  He will follow-up in the office to see Dr. Lanis in 1 month with a CTA chest, abdomen, and pelvis.  Hypertensive medication changes were reflected on his after visit summary.  He was also prescribed 1 to 2 days of narcotic pain medication for continued postoperative pain control.  He was discharged home in stable condition.  CBC    Component Value Date/Time   WBC 15.5 (H) 08/28/2023 0445   RBC 4.23 08/28/2023 0445   HGB 13.4 08/28/2023 0445   HGB 15.4 02/14/2022 1024   HCT 40.5 08/28/2023 0445   HCT 46.1 02/14/2022 1024   PLT 194 08/28/2023 0445   PLT 281 02/14/2022 1024   MCV 95.7 08/28/2023 0445   MCV 94 02/14/2022 1024   MCV 96 08/11/2013 0432   MCH 31.7 08/28/2023 0445   MCHC 33.1  08/28/2023 0445   RDW 14.1 08/28/2023 0445   RDW 12.4 02/14/2022 1024   RDW 13.3 08/11/2013 0432   LYMPHSABS 2.1 02/14/2022 1024   LYMPHSABS 2.3 08/11/2013 0432   MONOABS 0.8 08/11/2013 0432   EOSABS 0.2 02/14/2022 1024   EOSABS 0.2 08/11/2013 0432   BASOSABS 0.1 02/14/2022 1024   BASOSABS 0.0 08/11/2013 0432    BMET    Component Value Date/Time   NA 137 08/29/2023 0458   NA 141 02/14/2022 1024   NA 140 08/11/2013 0432   K 3.7 08/29/2023 0458   K 3.3 (L) 08/11/2013 0432   CL 106 08/29/2023 0458   CL 107 08/11/2013 0432   CO2 22 08/29/2023 0458   CO2 27 08/11/2013 0432   GLUCOSE 141 (H) 08/29/2023 0458   GLUCOSE 102 (H) 08/11/2013 0432   BUN 21 08/29/2023 0458   BUN 17 02/14/2022 1024   BUN 8 08/11/2013 0432   CREATININE 1.18 08/29/2023 0458   CREATININE 0.83 08/11/2013 0432   CALCIUM  9.4 08/29/2023 0458   CALCIUM  8.6 08/11/2013 0432   GFRNONAA >60 08/29/2023 0458   GFRNONAA >60 08/11/2013 0432   GFRAA 95 01/18/2020 1144   GFRAA >60 08/11/2013 0432         Discharge Diagnosis:  AAA (abdominal aortic aneurysm) (HCC) [I71.40]  Secondary Diagnosis: Patient Active Problem List   Diagnosis Date Noted   AAA (abdominal aortic aneurysm) (HCC) 08/26/2023   Prediabetes 02/14/2022   Essential hypertension 10/09/2021   Hypercalcemia 04/05/2021  Bilateral carotid artery stenosis 01/21/2021   Centrilobular emphysema (HCC) 01/21/2021   Fatigue 01/04/2021   Dyspnea on exertion 01/04/2021   Chest pain 01/04/2021   Thoracic aortic aneurysm without rupture (HCC) 12/31/2020   Pulmonary nodules/lesions, multiple 02/12/2018   Thoracic aortic atherosclerosis (HCC) 02/02/2018   Abdominal aortic aneurysm (AAA) without rupture 02/02/2018   Aortic dilatation (HCC) 10/14/2017   Benign prostatic hyperplasia with incomplete bladder emptying 02/05/2015   Hx of colonic polyps    Tobacco use disorder 09/01/2014   Basal cell carcinoma of nose 08/29/2014   Mixed hyperlipidemia  08/29/2014   History of malignant melanoma of skin 09/08/2013   Past Medical History:  Diagnosis Date   Abdominal aortic aneurysm (AAA) (HCC) 02/02/2018   Aneurysm, aortic (HCC)    Basal cell carcinoma 2016   Benign neoplasm of ascending colon    Benign neoplasm of descending colon    Benign neoplasm of sigmoid colon    CHF (congestive heart failure) (HCC)    Coronary artery disease    Dementia (HCC) 07/27/2023   Diverticulitis    DVT (deep venous thrombosis) (HCC)    Glaucoma    Hx of abdominal abscess 07/29/2013   2015 - diverticular abscess   Hypercholesteremia    Polyp of sigmoid colon    Rectal polyp    Thoracic aortic atherosclerosis (HCC) 02/02/2018   Wears dentures    partial upper     Allergies as of 08/30/2023   No Known Allergies      Medication List     TAKE these medications    aspirin  EC 81 MG tablet Take 1 tablet (81 mg total) by mouth daily. Swallow whole.   atorvastatin  40 MG tablet Commonly known as: LIPITOR Take 1 tablet (40 mg total) by mouth daily. What changed: when to take this   clopidogrel  75 MG tablet Commonly known as: PLAVIX  Take 1 tablet (75 mg total) by mouth daily.   donepezil  10 MG tablet Commonly known as: ARICEPT  Take 1 tablet by mouth at bedtime.   latanoprost 0.005 % ophthalmic solution Commonly known as: XALATAN Place 1 drop into both eyes at bedtime.   losartan  25 MG tablet Commonly known as: COZAAR  Take 1 tablet (25 mg total) by mouth daily. What changed: See the new instructions.   metoprolol  succinate 25 MG 24 hr tablet Commonly known as: TOPROL -XL Take 0.5 tablets (12.5 mg total) by mouth 2 (two) times daily.   oxyCODONE  5 MG immediate release tablet Commonly known as: Oxy IR/ROXICODONE  Take 1 tablet (5 mg total) by mouth every 6 (six) hours as needed for moderate pain (pain score 4-6).        Discharge Instructions:   [facility]  Discharge Instructions Endovascular  Aortic Aneurysm Repair  Please refer to the following instructions for your post-procedure care. Your surgeon or Physician Assistant will discuss any changes with you.  Activity  You are encouraged to walk as much as you can. You can slowly return to normal activities but must avoid strenuous activity and heavy lifting until your doctor tells you it's OK. Avoid activities such as vacuuming or swinging a gold club. It is normal to feel tired for several weeks after your surgery. Do not drive until your doctor gives the OK and you are no longer taking prescription pain medications. It is also normal to have difficulty with sleep habits, eating, and bowel movements after surgery. These will go away with time.  Bathing/Showering  You may shower  after you go home. If you have an incision, do not soak in a bathtub, hot tub, or swim until the incision heals completely.  Incision Care  Shower every day. Clean your incision with mild soap and water . Pat the area dry with a clean towel. You do not need a bandage unless otherwise instructed. Do not apply any ointments or creams to your incision. If you clothing is irritating, you may cover your incision with a dry gauze pad.  Diet  Resume your normal diet. There are no special food restrictions following this procedure. A low fat/low cholesterol diet is recommended for all patients with vascular disease. In order to heal from your surgery, it is CRITICAL to get adequate nutrition. Your body requires vitamins, minerals, and protein. Vegetables are the best source of vitamins and minerals. Vegetables also provide the perfect balance of protein. Processed food has little nutritional value, so try to avoid this.  Medications  Resume taking all of your medications unless your doctor or Physician Assistnat tells you not to. If your incision is causing pain, you may take over-the-counter pain relievers such as acetaminophen  (Tylenol ). If you were prescribed a  stronger pain medication, please be aware these medications can cause nausea and constipation. Prevent nausea by taking the medication with a snack or meal. Avoid constipation by drinking plenty of fluids and eating foods with a high amount of fiber, such as fruits, vegetables, and grains. Do not take Tylenol  if you are taking prescription pain medications.   Follow up  Our office will schedule a follow-up appointment with a C.T. scan 3-4 weeks after your surgery.  Please call us  immediately for any of the following conditions  Severe or worsening pain in your legs or feet or in your abdomen back or chest. Increased pain, redness, drainage (pus) from your incision sit. Increased abdominal pain, bloating, nausea, vomiting or persistent diarrhea. Fever of 101 degrees or higher. Swelling in your leg (s),  Reduce your risk of vascular disease  Stop smoking. If you would like help call [facility] at 1-800-QUIT-NOW (939-646-9474) or [place] at [phone]. Manage your cholesterol Maintain a desired weight Control your diabetes Keep your blood pressure down  If you have questions, please call the office at [phone].     Disposition: home  Patient's condition: is Good  Follow up: 1. Dr. Lanis in 4 weeks with CTA protocol   Donnice Sender, PA-C [facility] [phone] 08/31/2023  9:22 AM   - For VQI Registry use - Post-op:  Time to Extubation: [x]  In OR, [ ]  < 12 hrs, [ ]  12-24 hrs, [ ]  >=24 hrs Vasopressors Req. Post-op: No MI: No., [ ]  Troponin only, [ ]  EKG or Clinical New Arrhythmia: No CHF: No ICU Stay: 3 day in ICU Transfusion: No       Complications: Resp failure: No., [ ]  Pneumonia, [ ]  Ventilator Chg in renal function: No., [ ]  Inc. Cr > 0.5, [ ]  Temp. Dialysis,  [ ]  Permanent dialysis Leg ischemia: No., no Surgery needed, [ ]  Yes, Surgery needed,  [ ]  Amputation Bowel ischemia: No., [ ]  Medical Rx, [ ]  Surgical Rx Wound  complication: No., [ ]  Superficial separation/infection, [ ]  Return to OR Return to OR: No  Return to OR for bleeding: No Stroke: No., [ ]  Minor, [ ]  Major  Discharge medications: Statin use:  Yes  ASA use:  Yes  Plavix  use:  Yes  Beta blocker use:  Yes  ARB use:  Yes ACEI use:  No CCB use:  No

## 2023-09-01 ENCOUNTER — Other Ambulatory Visit: Payer: Self-pay

## 2023-09-01 DIAGNOSIS — I7142 Juxtarenal abdominal aortic aneurysm, without rupture: Secondary | ICD-10-CM

## 2023-09-11 ENCOUNTER — Encounter: Payer: Self-pay | Admitting: Surgery

## 2023-09-11 ENCOUNTER — Ambulatory Visit

## 2023-09-24 ENCOUNTER — Other Ambulatory Visit: Payer: Self-pay

## 2023-09-24 MED ORDER — CLOPIDOGREL BISULFATE 75 MG PO TABS: 75.0000 mg | ORAL_TABLET | Freq: Every day | ORAL | 0 refills | Status: AC

## 2023-09-28 ENCOUNTER — Other Ambulatory Visit: Payer: Self-pay | Admitting: Physician Assistant

## 2023-09-29 NOTE — Telephone Encounter (Signed)
 Lmovm to verify if pt is taking Atorvastatin .

## 2023-10-09 ENCOUNTER — Telehealth: Payer: Self-pay

## 2023-10-09 NOTE — Telephone Encounter (Signed)
 Pt's wife, Marlee Trentman , called requesting Losartan  refill.  Inocente knows to call Dr. Mady for refill.

## 2023-10-12 ENCOUNTER — Ambulatory Visit: Admitting: Surgery

## 2023-10-25 ENCOUNTER — Encounter: Payer: Self-pay | Admitting: Surgery

## 2023-10-26 ENCOUNTER — Encounter: Payer: Self-pay | Admitting: Surgery

## 2023-10-27 ENCOUNTER — Other Ambulatory Visit: Payer: Self-pay

## 2023-10-27 ENCOUNTER — Encounter: Payer: Self-pay | Admitting: Internal Medicine

## 2023-10-28 MED ORDER — ATORVASTATIN CALCIUM 40 MG PO TABS: 40.0000 mg | ORAL_TABLET | Freq: Every day | ORAL | 0 refills | Status: DC

## 2023-10-30 MED ORDER — LOSARTAN POTASSIUM 25 MG PO TABS: 25.0000 mg | ORAL_TABLET | Freq: Every day | ORAL | 1 refills | Status: AC

## 2023-10-30 MED ORDER — METOPROLOL SUCCINATE ER 25 MG PO TB24: 12.5000 mg | ORAL_TABLET | Freq: Two times a day (BID) | ORAL | 1 refills | Status: DC

## 2023-11-30 ENCOUNTER — Other Ambulatory Visit: Payer: Self-pay | Admitting: Physician Assistant

## 2024-01-07 ENCOUNTER — Telehealth: Payer: Self-pay

## 2024-01-07 NOTE — Telephone Encounter (Signed)
 Letter sent to patient's home address in order to prompt to call our office for scheduling of CT Scan.  Patient saw Dr. Serene on 7/13 but has not had the ordered CT Scan yet.    CT Scan sheet sent to scanning.

## 2024-02-29 ENCOUNTER — Encounter: Payer: Self-pay | Admitting: Surgery

## 2024-02-29 ENCOUNTER — Telehealth: Payer: Self-pay

## 2024-02-29 ENCOUNTER — Telehealth: Payer: Self-pay | Admitting: Internal Medicine

## 2024-02-29 NOTE — Telephone Encounter (Signed)
 Called pt's wife to address frustrations to receiving notifications. Per Inocente Carbine, pt doesn't go as far has the back deck, and stays in the house 24/7. MP asked Mrs. Stauber if the patient was no longer going to be seen at this practice because of the pt's condition. Wife says that is correct. An appt note was put on the last appt (10/12/23 appt with [facility]ham) not to r/s. Pt no longer going to be seen at this practice. MP

## 2024-02-29 NOTE — Telephone Encounter (Addendum)
 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 He will not go out of the house.    I have told your nurses and have sent messages in my chart acct. informing you of this matter!   Inocente Ray Inocente requested we stop sending Need appointment reminders.    Also inquired about why the surgery was done if patient's labs were abnormal.

## 2024-03-06 ENCOUNTER — Other Ambulatory Visit: Payer: Self-pay | Admitting: Physician Assistant

## 2024-03-17 ENCOUNTER — Other Ambulatory Visit: Payer: Self-pay | Admitting: Physician Assistant
